# Patient Record
Sex: Female | Born: 1950 | Race: White | Hispanic: No | Marital: Married | Smoking: Never smoker
Health system: Southern US, Community
[De-identification: ages and names within clinical notes are randomized; demographics above are authoritative.]

## PROBLEM LIST (undated history)

## (undated) DIAGNOSIS — N3281 Overactive bladder: Secondary | ICD-10-CM

## (undated) DIAGNOSIS — J189 Pneumonia, unspecified organism: Secondary | ICD-10-CM

## (undated) DIAGNOSIS — E079 Disorder of thyroid, unspecified: Secondary | ICD-10-CM

## (undated) DIAGNOSIS — D649 Anemia, unspecified: Secondary | ICD-10-CM

## (undated) DIAGNOSIS — E039 Hypothyroidism, unspecified: Secondary | ICD-10-CM

## (undated) DIAGNOSIS — T7840XA Allergy, unspecified, initial encounter: Secondary | ICD-10-CM

## (undated) DIAGNOSIS — C801 Malignant (primary) neoplasm, unspecified: Secondary | ICD-10-CM

## (undated) DIAGNOSIS — H8110 Benign paroxysmal vertigo, unspecified ear: Secondary | ICD-10-CM

## (undated) DIAGNOSIS — M79609 Pain in unspecified limb: Secondary | ICD-10-CM

## (undated) DIAGNOSIS — K635 Polyp of colon: Secondary | ICD-10-CM

## (undated) HISTORY — PX: HAND SURGERY: SHX662

## (undated) HISTORY — PX: ABDOMINAL HYSTERECTOMY: SHX81

## (undated) HISTORY — DX: Polyp of colon: K63.5

## (undated) HISTORY — DX: Disorder of thyroid, unspecified: E07.9

## (undated) HISTORY — PX: INGUINAL HERNIA REPAIR: SUR1180

## (undated) HISTORY — DX: Anemia, unspecified: D64.9

## (undated) HISTORY — DX: Pain in unspecified limb: M79.609

## (undated) HISTORY — PX: COLONOSCOPY: SHX174

## (undated) HISTORY — DX: Allergy, unspecified, initial encounter: T78.40XA

## (undated) HISTORY — PX: TONSILLECTOMY: SUR1361

## (undated) HISTORY — PX: TUBAL LIGATION: SHX77

## (undated) HISTORY — PX: POLYPECTOMY: SHX149

## (undated) HISTORY — DX: Benign paroxysmal vertigo, unspecified ear: H81.10

## (undated) HISTORY — PX: UMBILICAL HERNIA REPAIR: SHX196

## (undated) HISTORY — DX: Malignant (primary) neoplasm, unspecified: C80.1

---

## 1993-07-25 HISTORY — PX: BILATERAL OOPHORECTOMY: SHX1221

## 1998-01-05 ENCOUNTER — Ambulatory Visit (HOSPITAL_COMMUNITY): Admission: RE | Admit: 1998-01-05 | Discharge: 1998-01-05 | Payer: Self-pay | Admitting: Urology

## 1998-01-12 ENCOUNTER — Ambulatory Visit (HOSPITAL_COMMUNITY): Admission: RE | Admit: 1998-01-12 | Discharge: 1998-01-12 | Payer: Self-pay | Admitting: Urology

## 1998-05-22 ENCOUNTER — Other Ambulatory Visit: Admission: RE | Admit: 1998-05-22 | Discharge: 1998-05-22 | Payer: Self-pay | Admitting: Obstetrics and Gynecology

## 1999-08-26 ENCOUNTER — Other Ambulatory Visit: Admission: RE | Admit: 1999-08-26 | Discharge: 1999-08-26 | Payer: Self-pay | Admitting: Internal Medicine

## 1999-09-24 ENCOUNTER — Encounter: Admission: RE | Admit: 1999-09-24 | Discharge: 1999-09-24 | Payer: Self-pay | Admitting: Obstetrics and Gynecology

## 1999-09-24 ENCOUNTER — Encounter: Payer: Self-pay | Admitting: Obstetrics and Gynecology

## 1999-10-04 ENCOUNTER — Encounter: Payer: Self-pay | Admitting: Obstetrics and Gynecology

## 1999-10-04 ENCOUNTER — Encounter: Admission: RE | Admit: 1999-10-04 | Discharge: 1999-10-04 | Payer: Self-pay | Admitting: Obstetrics and Gynecology

## 2000-11-02 ENCOUNTER — Encounter: Admission: RE | Admit: 2000-11-02 | Discharge: 2000-11-02 | Payer: Self-pay | Admitting: Obstetrics and Gynecology

## 2000-11-02 ENCOUNTER — Encounter: Payer: Self-pay | Admitting: Obstetrics and Gynecology

## 2001-12-11 ENCOUNTER — Encounter: Payer: Self-pay | Admitting: Family Medicine

## 2001-12-11 ENCOUNTER — Encounter: Admission: RE | Admit: 2001-12-11 | Discharge: 2001-12-11 | Payer: Self-pay | Admitting: Family Medicine

## 2003-04-25 ENCOUNTER — Encounter: Payer: Self-pay | Admitting: Family Medicine

## 2003-04-25 ENCOUNTER — Encounter: Admission: RE | Admit: 2003-04-25 | Discharge: 2003-04-25 | Payer: Self-pay | Admitting: Family Medicine

## 2003-06-27 ENCOUNTER — Encounter: Admission: RE | Admit: 2003-06-27 | Discharge: 2003-06-27 | Payer: Self-pay | Admitting: Family Medicine

## 2004-06-30 ENCOUNTER — Encounter: Admission: RE | Admit: 2004-06-30 | Discharge: 2004-06-30 | Payer: Self-pay | Admitting: Family Medicine

## 2005-08-03 ENCOUNTER — Encounter: Admission: RE | Admit: 2005-08-03 | Discharge: 2005-08-03 | Payer: Self-pay | Admitting: Family Medicine

## 2006-08-08 ENCOUNTER — Encounter: Admission: RE | Admit: 2006-08-08 | Discharge: 2006-08-08 | Payer: Self-pay | Admitting: Family Medicine

## 2007-08-13 ENCOUNTER — Encounter: Admission: RE | Admit: 2007-08-13 | Discharge: 2007-08-13 | Payer: Self-pay | Admitting: Family Medicine

## 2007-08-20 ENCOUNTER — Encounter: Admission: RE | Admit: 2007-08-20 | Discharge: 2007-08-20 | Payer: Self-pay | Admitting: Family Medicine

## 2008-08-13 ENCOUNTER — Encounter: Admission: RE | Admit: 2008-08-13 | Discharge: 2008-08-13 | Payer: Self-pay | Admitting: Family Medicine

## 2009-06-30 ENCOUNTER — Encounter (INDEPENDENT_AMBULATORY_CARE_PROVIDER_SITE_OTHER): Payer: Self-pay | Admitting: *Deleted

## 2009-07-01 ENCOUNTER — Ambulatory Visit: Payer: Self-pay | Admitting: Internal Medicine

## 2009-07-07 ENCOUNTER — Ambulatory Visit: Payer: Self-pay | Admitting: Internal Medicine

## 2009-07-08 ENCOUNTER — Encounter: Payer: Self-pay | Admitting: Internal Medicine

## 2009-08-24 ENCOUNTER — Encounter: Admission: RE | Admit: 2009-08-24 | Discharge: 2009-08-24 | Payer: Self-pay | Admitting: Family Medicine

## 2010-08-14 ENCOUNTER — Other Ambulatory Visit: Payer: Self-pay | Admitting: Family Medicine

## 2010-08-14 DIAGNOSIS — Z0000 Encounter for general adult medical examination without abnormal findings: Secondary | ICD-10-CM

## 2010-08-26 ENCOUNTER — Ambulatory Visit
Admission: RE | Admit: 2010-08-26 | Discharge: 2010-08-26 | Disposition: A | Discharge status: Home / Self Care | Payer: Self-pay | Source: Ambulatory Visit | Attending: Family Medicine | Admitting: Family Medicine

## 2010-08-26 DIAGNOSIS — Z0000 Encounter for general adult medical examination without abnormal findings: Secondary | ICD-10-CM

## 2010-08-31 ENCOUNTER — Other Ambulatory Visit: Payer: Self-pay | Admitting: Family Medicine

## 2010-08-31 DIAGNOSIS — R928 Other abnormal and inconclusive findings on diagnostic imaging of breast: Secondary | ICD-10-CM

## 2010-09-08 ENCOUNTER — Ambulatory Visit
Admission: RE | Admit: 2010-09-08 | Discharge: 2010-09-08 | Disposition: A | Discharge status: Home / Self Care | Payer: Commercial Indemnity | Source: Ambulatory Visit | Attending: Family Medicine | Admitting: Family Medicine

## 2010-09-08 DIAGNOSIS — R928 Other abnormal and inconclusive findings on diagnostic imaging of breast: Secondary | ICD-10-CM

## 2011-01-24 ENCOUNTER — Other Ambulatory Visit: Payer: Self-pay | Admitting: Family Medicine

## 2011-01-24 DIAGNOSIS — M7989 Other specified soft tissue disorders: Secondary | ICD-10-CM

## 2011-01-25 ENCOUNTER — Ambulatory Visit
Admission: RE | Admit: 2011-01-25 | Discharge: 2011-01-25 | Disposition: A | Discharge status: Home / Self Care | Payer: Commercial Indemnity | Source: Ambulatory Visit | Attending: Family Medicine | Admitting: Family Medicine

## 2011-01-25 DIAGNOSIS — M7989 Other specified soft tissue disorders: Secondary | ICD-10-CM

## 2011-03-15 ENCOUNTER — Encounter: Payer: Self-pay | Admitting: Vascular Surgery

## 2011-03-15 DIAGNOSIS — I8390 Asymptomatic varicose veins of unspecified lower extremity: Secondary | ICD-10-CM | POA: Insufficient documentation

## 2011-03-16 ENCOUNTER — Encounter: Payer: Self-pay | Admitting: Vascular Surgery

## 2011-03-21 ENCOUNTER — Encounter: Payer: Self-pay | Admitting: Vascular Surgery

## 2011-03-21 ENCOUNTER — Ambulatory Visit (INDEPENDENT_AMBULATORY_CARE_PROVIDER_SITE_OTHER): Payer: Commercial Indemnity | Admitting: Vascular Surgery

## 2011-03-21 VITALS — BP 134/53 | HR 76 | Resp 20 | Ht 64.0 in | Wt 178.0 lb

## 2011-03-21 DIAGNOSIS — M79609 Pain in unspecified limb: Secondary | ICD-10-CM

## 2011-03-21 DIAGNOSIS — M7989 Other specified soft tissue disorders: Secondary | ICD-10-CM

## 2011-03-21 NOTE — Progress Notes (Signed)
Addended by: Josephina Gip D on: 03/21/2011 02:58 PM   Modules accepted: Orders

## 2011-03-21 NOTE — Progress Notes (Signed)
Subjective:     Patient ID: Karen Jones, female   DOB: 08-23-1950, 60 y.o.   MRN: [id number]  HPI this 60 year old female was referred for evaluation of possible venous insufficiency of both lower extremities. The patient states that for many years now she has had episodes while on her feet and both legs would develop a reddish discoloration in the lower third of the leg. She has no history of deep venous thrombosis or thrombophlebitis or bulging varicose veins. She is intermittently worn elastic compression stockings. She had an episode this summer when both legs became severely swollen on her feet at [employer]. She was given a diuretic subsequently and this resolved her edema. She's had no history of stasis ulcers or bleeding. Currently she states her legs are close to her  baseline for the last several years.  Past Medical History  Diagnosis Date  . Cancer     skin  . Pain in limb     History  Substance Use Topics  . Smoking status: Never Smoker   . Smokeless tobacco: Not on file  . Alcohol Use: Yes    History reviewed. No pertinent family history.  Allergies  Allergen Reactions  . Penicillins     REACTION: rash, difficulty breathing    Current outpatient prescriptions:estradiol (ESTRACE) 0.5 MG tablet, Take 0.5 mg by mouth daily.  , Disp: , Rfl: ;  tolterodine (DETROL LA) 4 MG 24 hr capsule, Take 4 mg by mouth daily.  , Disp: , Rfl:   BP 134/53  Pulse 76  Resp 20  Ht 5\' 4"  (1.626 m)  Wt 178 lb (80.74 kg)  BMI 30.55 kg/m2  Body mass index is 30.55 kg/(m^2).        Review of Systems she denies chest pain dyspnea on exertion PND orthopnea or lower extremity claudication symptoms she is able walk about 1 mile. All systems are negative and a complete review of systems.    Objective:   Physical Exam blood pressure 134/53 heart rate 76 respirations 20 General he is well-developed well-nourished female in no apparent stress he's alert and oriented x3 Chest clear to  auscultation no rhonchi or wheezing Cardiovascular exam reveals a regular rhythm no murmurs carotid pulses are 3+ with no audible bruits Abdomen soft nontender with no masses Neurologic exam is normal Musculoskeletal exams negativefor major deformities Skin exam reveals some mild discoloration in the lower legs symmetrically in the lower third. There are no bulging varicosities noted. She has some spider and reticular veins around the ankle areas bilaterally. There is 1+ edema at most. She is restless femoral popliteal and dorsalis pedis pulses palpable bilaterally.    Assessment:    to rule out valvular incompetence in the superficial venous system were obtained a reflux venous exam bilaterally. There is no evidence of DVT on her previous venous ultrasound study.    Plan:     #1 schedule bilateral venous duplex exam for reflux #2 fit her for some 20-30 mm short-leg elastic compression stockings #3 elevate legs when able to #4 continue intermittent diuretic therapy as prescribed by her medical doctor If her venous duplex exam is abnormal with gross reflux and superficial system-we'll reassess things at that time.

## 2011-04-01 ENCOUNTER — Ambulatory Visit (INDEPENDENT_AMBULATORY_CARE_PROVIDER_SITE_OTHER): Payer: Commercial Indemnity | Admitting: Vascular Surgery

## 2011-04-01 DIAGNOSIS — I83893 Varicose veins of bilateral lower extremities with other complications: Secondary | ICD-10-CM

## 2011-04-01 DIAGNOSIS — R609 Edema, unspecified: Secondary | ICD-10-CM

## 2011-04-01 DIAGNOSIS — I8390 Asymptomatic varicose veins of unspecified lower extremity: Secondary | ICD-10-CM

## 2011-04-05 ENCOUNTER — Telehealth: Payer: Self-pay | Admitting: *Deleted

## 2011-04-05 NOTE — Telephone Encounter (Signed)
Patient came in for a Reflux study on 04/01/11. Reviewed the results with Dr. Hart Rochester. He told me to call the patient, tell her the study was mildly abnormal, and there was nothing for [place] to do right now. Her vessel size is very small and the reflux is minimal. I left a message on the answering machine at her house, and told her to call me back if she had further questions.

## 2011-04-06 NOTE — Procedures (Unsigned)
LOWER EXTREMITY VENOUS REFLUX EXAM  INDICATION:  Bilateral lower extremity pain and edema, varicose veins.  EXAM:  Using color-flow imaging and pulse Doppler spectral analysis, the bilateral common femoral, femoral, popliteal, posterior tibial, great and small saphenous veins were evaluated.  There is evidence suggesting deep venous insufficiency in the bilateral lower extremities.  The bilateral saphenofemoral junctions are competent.  The right GSV is not competent with reflux of >500 milliseconds and the caliber as described below.  The left GSV is competent.  The right proximal small saphenous vein demonstrates incompetency and diameter measurements range from 0.19 cm to 0.57 cm.  The left proximal small saphenous vein demonstrates competency.  GSV Diameter (used if found to be incompetent only)                                           Right    Left Proximal Greater Saphenous Vein           0.62 cm  cm Proximal-to-mid-thigh                     0.34 cm  cm Mid thigh                                 0.21 cm  cm Mid-distal thigh                          cm       cm Distal thigh                              0.29 cm  cm Knee                                      0.28 cm  cm  IMPRESSION: 1. The right great saphenous vein is not competent with reflux of >500     milliseconds. 2. The left great saphenous vein is competent. 3. The bilateral great saphenous veins are not tortuous. 4. The deep venous system bilaterally is not competent with reflux of     >500 milliseconds. 5. The right small saphenous vein is not competent with reflux of >500     milliseconds. 6. The left small saphenous vein is competent.  ___________________________________________ Quita Skye. Hart Rochester, M.D.  SH/MEDQ  D:  04/01/2011  T:  04/01/2011  Job:  [id number]

## 2011-04-26 ENCOUNTER — Telehealth: Payer: Self-pay | Admitting: *Deleted

## 2011-04-26 NOTE — Telephone Encounter (Signed)
Called Karen Jones back and told her the vessel size was too small for [place] to perform a laser ablation. She was fitted with compression stockings and instructed on elevation, etc. She can return to [place] if her symptoms worsen which would indicate that her vessels had increased in size and were good candidates for a laser procedure.

## 2011-04-26 NOTE — Telephone Encounter (Signed)
Crystal from [employer] called for clarification on the results of the Duplex that was performed.

## 2011-05-10 ENCOUNTER — Ambulatory Visit: Payer: Commercial Indemnity | Admitting: Vascular Surgery

## 2011-08-11 ENCOUNTER — Other Ambulatory Visit: Payer: Self-pay | Admitting: Family Medicine

## 2011-08-11 DIAGNOSIS — Z1231 Encounter for screening mammogram for malignant neoplasm of breast: Secondary | ICD-10-CM

## 2011-08-30 ENCOUNTER — Ambulatory Visit
Admission: RE | Admit: 2011-08-30 | Discharge: 2011-08-30 | Disposition: A | Discharge status: Home / Self Care | Payer: Commercial Indemnity | Source: Ambulatory Visit | Attending: Family Medicine | Admitting: Family Medicine

## 2011-08-30 DIAGNOSIS — Z1231 Encounter for screening mammogram for malignant neoplasm of breast: Secondary | ICD-10-CM

## 2011-09-06 ENCOUNTER — Other Ambulatory Visit: Payer: Self-pay | Admitting: Family Medicine

## 2011-09-06 DIAGNOSIS — R928 Other abnormal and inconclusive findings on diagnostic imaging of breast: Secondary | ICD-10-CM

## 2011-09-21 ENCOUNTER — Ambulatory Visit
Admission: RE | Admit: 2011-09-21 | Discharge: 2011-09-21 | Disposition: A | Discharge status: Home / Self Care | Payer: Commercial Indemnity | Source: Ambulatory Visit | Attending: Family Medicine | Admitting: Family Medicine

## 2011-09-21 DIAGNOSIS — R928 Other abnormal and inconclusive findings on diagnostic imaging of breast: Secondary | ICD-10-CM

## 2012-08-13 ENCOUNTER — Other Ambulatory Visit: Payer: Self-pay | Admitting: Family Medicine

## 2012-08-13 DIAGNOSIS — Z1231 Encounter for screening mammogram for malignant neoplasm of breast: Secondary | ICD-10-CM

## 2012-08-30 ENCOUNTER — Ambulatory Visit
Admission: RE | Admit: 2012-08-30 | Discharge: 2012-08-30 | Disposition: A | Discharge status: Home / Self Care | Payer: Commercial Indemnity | Source: Ambulatory Visit | Attending: Family Medicine | Admitting: Family Medicine

## 2012-08-30 DIAGNOSIS — Z1231 Encounter for screening mammogram for malignant neoplasm of breast: Secondary | ICD-10-CM

## 2012-09-03 ENCOUNTER — Other Ambulatory Visit: Payer: Self-pay | Admitting: Family Medicine

## 2012-09-03 DIAGNOSIS — N63 Unspecified lump in unspecified breast: Secondary | ICD-10-CM

## 2012-09-03 DIAGNOSIS — N644 Mastodynia: Secondary | ICD-10-CM

## 2012-09-11 ENCOUNTER — Other Ambulatory Visit: Payer: Self-pay | Admitting: Family Medicine

## 2012-09-11 DIAGNOSIS — N63 Unspecified lump in unspecified breast: Secondary | ICD-10-CM

## 2012-09-11 DIAGNOSIS — N644 Mastodynia: Secondary | ICD-10-CM

## 2012-09-19 ENCOUNTER — Other Ambulatory Visit: Payer: Self-pay | Admitting: Family Medicine

## 2012-09-19 ENCOUNTER — Ambulatory Visit
Admission: RE | Admit: 2012-09-19 | Discharge: 2012-09-19 | Disposition: A | Discharge status: Home / Self Care | Payer: Commercial Indemnity | Source: Ambulatory Visit | Attending: Family Medicine | Admitting: Family Medicine

## 2012-09-19 DIAGNOSIS — N63 Unspecified lump in unspecified breast: Secondary | ICD-10-CM

## 2012-09-20 ENCOUNTER — Other Ambulatory Visit (HOSPITAL_COMMUNITY)
Admission: RE | Admit: 2012-09-20 | Discharge: 2012-09-20 | Disposition: A | Discharge status: Home / Self Care | Payer: Commercial Indemnity | Source: Ambulatory Visit | Attending: Diagnostic Radiology | Admitting: Diagnostic Radiology

## 2012-09-20 DIAGNOSIS — N6009 Solitary cyst of unspecified breast: Secondary | ICD-10-CM | POA: Insufficient documentation

## 2012-11-15 ENCOUNTER — Other Ambulatory Visit: Payer: Self-pay | Admitting: Family Medicine

## 2012-11-15 DIAGNOSIS — N6001 Solitary cyst of right breast: Secondary | ICD-10-CM

## 2012-12-14 ENCOUNTER — Ambulatory Visit
Admission: RE | Admit: 2012-12-14 | Discharge: 2012-12-14 | Disposition: A | Discharge status: Home / Self Care | Payer: Managed Care, Other (non HMO) | Source: Ambulatory Visit | Attending: Family Medicine | Admitting: Family Medicine

## 2012-12-14 DIAGNOSIS — N6001 Solitary cyst of right breast: Secondary | ICD-10-CM

## 2013-08-19 ENCOUNTER — Other Ambulatory Visit: Payer: Self-pay

## 2013-08-19 DIAGNOSIS — Z1231 Encounter for screening mammogram for malignant neoplasm of breast: Secondary | ICD-10-CM

## 2013-09-20 ENCOUNTER — Ambulatory Visit
Admission: RE | Admit: 2013-09-20 | Discharge: 2013-09-20 | Disposition: A | Discharge status: Home / Self Care | Payer: Self-pay | Source: Ambulatory Visit

## 2013-09-20 DIAGNOSIS — Z1231 Encounter for screening mammogram for malignant neoplasm of breast: Secondary | ICD-10-CM

## 2013-09-23 ENCOUNTER — Other Ambulatory Visit: Payer: Self-pay | Admitting: Family Medicine

## 2013-09-23 DIAGNOSIS — R928 Other abnormal and inconclusive findings on diagnostic imaging of breast: Secondary | ICD-10-CM

## 2013-10-03 ENCOUNTER — Ambulatory Visit
Admission: RE | Admit: 2013-10-03 | Discharge: 2013-10-03 | Disposition: A | Discharge status: Home / Self Care | Payer: Self-pay | Source: Ambulatory Visit | Attending: Family Medicine | Admitting: Family Medicine

## 2013-10-03 DIAGNOSIS — R928 Other abnormal and inconclusive findings on diagnostic imaging of breast: Secondary | ICD-10-CM

## 2014-03-05 ENCOUNTER — Other Ambulatory Visit: Payer: Self-pay | Admitting: Family Medicine

## 2014-03-05 DIAGNOSIS — R921 Mammographic calcification found on diagnostic imaging of breast: Secondary | ICD-10-CM

## 2014-04-02 ENCOUNTER — Encounter: Payer: Self-pay | Admitting: Internal Medicine

## 2014-04-08 ENCOUNTER — Other Ambulatory Visit: Payer: Self-pay | Admitting: Family Medicine

## 2014-04-08 ENCOUNTER — Ambulatory Visit
Admission: RE | Admit: 2014-04-08 | Discharge: 2014-04-08 | Disposition: A | Discharge status: Home / Self Care | Payer: Medicare Other | Source: Ambulatory Visit | Attending: Family Medicine | Admitting: Family Medicine

## 2014-04-08 ENCOUNTER — Encounter (INDEPENDENT_AMBULATORY_CARE_PROVIDER_SITE_OTHER): Payer: Self-pay

## 2014-04-08 DIAGNOSIS — R921 Mammographic calcification found on diagnostic imaging of breast: Secondary | ICD-10-CM

## 2014-04-30 ENCOUNTER — Encounter: Payer: Self-pay | Admitting: Internal Medicine

## 2014-06-16 ENCOUNTER — Ambulatory Visit (AMBULATORY_SURGERY_CENTER): Payer: Self-pay | Admitting: *Deleted

## 2014-06-16 VITALS — Ht 64.5 in | Wt 183.0 lb

## 2014-06-16 DIAGNOSIS — Z86010 Personal history of colonic polyps: Secondary | ICD-10-CM

## 2014-06-16 MED ORDER — MOVIPREP 100 G PO SOLR: ORAL | Status: DC

## 2014-06-16 NOTE — Progress Notes (Signed)
Patient denies any allergies to eggs or soy. Patient denies any problems with anesthesia/sedation. Patient denies any oxygen use at home and does not take any diet/weight loss medications. EMMI education assisgned to patient on colonoscopy, this was explained and instructions given to patient. 

## 2014-07-02 ENCOUNTER — Encounter: Payer: Self-pay | Admitting: Internal Medicine

## 2014-07-02 ENCOUNTER — Ambulatory Visit (AMBULATORY_SURGERY_CENTER): Payer: Medicare Other | Admitting: Internal Medicine

## 2014-07-02 VITALS — BP 122/64 | HR 67 | Temp 97.7°F | Resp 15 | Ht 64.5 in | Wt 183.0 lb

## 2014-07-02 DIAGNOSIS — Z800 Family history of malignant neoplasm of digestive organs: Secondary | ICD-10-CM

## 2014-07-02 DIAGNOSIS — D122 Benign neoplasm of ascending colon: Secondary | ICD-10-CM

## 2014-07-02 DIAGNOSIS — D124 Benign neoplasm of descending colon: Secondary | ICD-10-CM

## 2014-07-02 DIAGNOSIS — Z86010 Personal history of colonic polyps: Secondary | ICD-10-CM

## 2014-07-02 MED ORDER — SODIUM CHLORIDE 0.9 % IV SOLN: 500.0000 mL | INTRAVENOUS | Status: DC

## 2014-07-02 NOTE — Progress Notes (Signed)
Called to room to assist during endoscopic procedure.  Patient ID and intended procedure confirmed with present staff. Received instructions for my participation in the procedure from the performing physician.  

## 2014-07-02 NOTE — Patient Instructions (Signed)

## 2014-07-02 NOTE — Op Note (Signed)
[facility]  [address] [place], [address]   COLONOSCOPY PROCEDURE REPORT  PATIENT: Karen, Jones  MR#: [id number] BIRTHDATE: [birth year] , 50  yrs. old GENDER: female ENDOSCOPIST: Lafayette Dragon, MD REFERRED VO:HYWV [place], [place] PROCEDURE DATE:  07/02/2014 PROCEDURE:   Colonoscopy with snare polypectomy First Screening Colonoscopy - Avg.  risk and is 50 yrs.  old or older - No.  Prior Negative Screening - Now for repeat screening. N/A  History of Adenoma - Now for follow-up colonoscopy & has been > or = to 3 yrs.  Yes hx of adenoma.  Has been 3 or more years since last colonoscopy.  History of Adenoma - Now for follow-up colonoscopy & has been > or = to 3 yrs.  Polyps Removed Today? Yes. ASA CLASS:   Class II INDICATIONS:patient's immediate family history of colon cancer and parent with colon cancer.  First colonoscopy in 1995 dysplastic polyp.  In 1997 tubular adenoma, 2005 normal colonoscopy,, December 2010 2 adenomatous and hyperplastic polyps. MEDICATIONS: Monitored anesthesia care and Propofol 400 mg IV  DESCRIPTION OF PROCEDURE:   After the risks benefits and alternatives of the procedure were thoroughly explained, informed consent was obtained.  The digital rectal exam revealed no abnormalities of the rectum.   The LB [id number]  endoscope was introduced through the anus and advanced to the cecum, which was identified by both the appendix and ileocecal valve. No adverse events experienced.   The quality of the prep was good, using MoviPrep  The instrument was then slowly withdrawn as the colon was fully examined.      COLON FINDINGS: Six polypoid shaped and ulcerated semi-pedunculated polyps measuring 23 mm x1 and 4 mm x3 ( ascending colon), in size were found in the descending colon x2 66mm and 3 mm and ascending colon.  A polypectomy was performed with a cold snare.  The resection was complete, the polyp tissue was completely retrieved and  sent to histology.  Retroflexed views revealed no abnormalities. The time to cecum=10 minutes 00 seconds.  Withdrawal time=17 minutes 42 seconds.  The scope was withdrawn and the procedure completed. COMPLICATIONS: There were no immediate complications.  ENDOSCOPIC IMPRESSION: Six semi-pedunculated polyps were found in the descending colon x2 and ascending colon x 4; polypectomy was performed with a cold snare , largest polyp 23 mm, snared piecemeal  RECOMMENDATIONS: 1.  Await pathology results 2.  No aspirin or anti-inflammatory agents for 2 weeks Recall colonoscopy 5 years  eSigned:  Lafayette Dragon, MD 07/02/2014 11:20 AM   cc:   PATIENT NAME:  Karen, Jones MR#: [id number]

## 2014-07-02 NOTE — Progress Notes (Signed)
Patient awakening,vss,report to rn 

## 2014-07-03 ENCOUNTER — Telehealth: Payer: Self-pay | Admitting: *Deleted

## 2014-07-03 NOTE — Telephone Encounter (Signed)
  Follow up Call-  Call back number 07/02/2014  Post procedure Call Back phone  # [phone]  Permission to leave phone message Yes     Patient questions:  Do you have a fever, pain , or abdominal swelling? No. Pain Score  0 *  Have you tolerated food without any problems? Yes.    Have you been able to return to your normal activities? Yes.    Do you have any questions about your discharge instructions: Diet   No. Medications  No. Follow up visit  No.  Do you have questions or concerns about your Care? No.  Actions: * If pain score is 4 or above: No action needed, pain <4.

## 2014-07-07 ENCOUNTER — Encounter: Payer: Self-pay | Admitting: Internal Medicine

## 2014-09-09 ENCOUNTER — Other Ambulatory Visit: Payer: Self-pay | Admitting: Family Medicine

## 2014-09-09 DIAGNOSIS — R921 Mammographic calcification found on diagnostic imaging of breast: Secondary | ICD-10-CM

## 2014-09-09 DIAGNOSIS — N3281 Overactive bladder: Secondary | ICD-10-CM | POA: Diagnosis not present

## 2014-09-09 DIAGNOSIS — J019 Acute sinusitis, unspecified: Secondary | ICD-10-CM | POA: Diagnosis not present

## 2014-09-22 ENCOUNTER — Ambulatory Visit
Admission: RE | Admit: 2014-09-22 | Discharge: 2014-09-22 | Disposition: A | Discharge status: Home / Self Care | Payer: Medicare Other | Source: Ambulatory Visit | Attending: Family Medicine | Admitting: Family Medicine

## 2014-09-22 ENCOUNTER — Encounter (INDEPENDENT_AMBULATORY_CARE_PROVIDER_SITE_OTHER): Payer: Self-pay

## 2014-09-22 DIAGNOSIS — R928 Other abnormal and inconclusive findings on diagnostic imaging of breast: Secondary | ICD-10-CM | POA: Diagnosis not present

## 2014-09-22 DIAGNOSIS — R921 Mammographic calcification found on diagnostic imaging of breast: Secondary | ICD-10-CM

## 2014-11-17 DIAGNOSIS — J019 Acute sinusitis, unspecified: Secondary | ICD-10-CM | POA: Diagnosis not present

## 2014-11-17 DIAGNOSIS — J22 Unspecified acute lower respiratory infection: Secondary | ICD-10-CM | POA: Diagnosis not present

## 2014-12-23 DIAGNOSIS — R2230 Localized swelling, mass and lump, unspecified upper limb: Secondary | ICD-10-CM | POA: Diagnosis not present

## 2014-12-23 DIAGNOSIS — R222 Localized swelling, mass and lump, trunk: Secondary | ICD-10-CM | POA: Diagnosis not present

## 2014-12-23 DIAGNOSIS — M79629 Pain in unspecified upper arm: Secondary | ICD-10-CM | POA: Diagnosis not present

## 2014-12-26 ENCOUNTER — Other Ambulatory Visit: Payer: Self-pay | Admitting: Family Medicine

## 2014-12-26 DIAGNOSIS — R222 Localized swelling, mass and lump, trunk: Secondary | ICD-10-CM

## 2014-12-26 DIAGNOSIS — R2231 Localized swelling, mass and lump, right upper limb: Secondary | ICD-10-CM

## 2015-01-09 ENCOUNTER — Other Ambulatory Visit: Payer: Medicare Other

## 2015-01-09 ENCOUNTER — Ambulatory Visit
Admission: RE | Admit: 2015-01-09 | Discharge: 2015-01-09 | Disposition: A | Discharge status: Home / Self Care | Payer: Medicare Other | Source: Ambulatory Visit | Attending: Family Medicine | Admitting: Family Medicine

## 2015-01-09 DIAGNOSIS — R0789 Other chest pain: Secondary | ICD-10-CM | POA: Diagnosis not present

## 2015-01-09 DIAGNOSIS — R222 Localized swelling, mass and lump, trunk: Secondary | ICD-10-CM

## 2015-01-09 MED ORDER — GADOBENATE DIMEGLUMINE 529 MG/ML IV SOLN
17.0000 mL | Freq: Once | INTRAVENOUS | Status: AC | PRN
  Administered 2015-01-09: 17 mL via INTRAVENOUS

## 2015-01-19 ENCOUNTER — Other Ambulatory Visit: Payer: Self-pay

## 2015-01-21 ENCOUNTER — Encounter: Payer: Self-pay | Admitting: Internal Medicine

## 2015-02-09 DIAGNOSIS — R079 Chest pain, unspecified: Secondary | ICD-10-CM | POA: Diagnosis not present

## 2015-02-09 DIAGNOSIS — R0789 Other chest pain: Secondary | ICD-10-CM | POA: Diagnosis not present

## 2015-02-09 DIAGNOSIS — E039 Hypothyroidism, unspecified: Secondary | ICD-10-CM | POA: Diagnosis not present

## 2015-02-09 DIAGNOSIS — E668 Other obesity: Secondary | ICD-10-CM | POA: Diagnosis not present

## 2015-03-05 DIAGNOSIS — R0789 Other chest pain: Secondary | ICD-10-CM | POA: Diagnosis not present

## 2015-03-05 DIAGNOSIS — E039 Hypothyroidism, unspecified: Secondary | ICD-10-CM | POA: Diagnosis not present

## 2015-03-05 DIAGNOSIS — Z13220 Encounter for screening for lipoid disorders: Secondary | ICD-10-CM | POA: Diagnosis not present

## 2015-03-05 LAB — LIPID PANEL
Cholesterol: 174 mg/dL (ref 0–200)
HDL: 63 mg/dL (ref 35–70)
LDL CALC: 88 mg/dL
Triglycerides: 116 mg/dL (ref 40–160)

## 2015-03-05 LAB — TSH: TSH: 1.7 u[IU]/mL (ref <=5.90)

## 2015-03-05 LAB — BASIC METABOLIC PANEL
BUN: 13 mg/dL (ref 4–21)
Creatinine: 0.7 mg/dL (ref <=1.1)
Glucose: 102 mg/dL
Potassium: 5.1 mmol/L (ref 3.4–5.3)
SODIUM: 144 mmol/L (ref 137–147)

## 2015-03-05 LAB — HEPATIC FUNCTION PANEL
ALK PHOS: 45 U/L (ref 25–125)
ALT: 9 U/L (ref 7–35)
AST: 13 U/L (ref 13–35)
Bilirubin, Total: 0.3 mg/dL

## 2015-03-09 DIAGNOSIS — Z79890 Hormone replacement therapy (postmenopausal): Secondary | ICD-10-CM | POA: Diagnosis not present

## 2015-03-09 DIAGNOSIS — H9193 Unspecified hearing loss, bilateral: Secondary | ICD-10-CM | POA: Diagnosis not present

## 2015-03-09 DIAGNOSIS — N3281 Overactive bladder: Secondary | ICD-10-CM | POA: Diagnosis not present

## 2015-03-09 DIAGNOSIS — Z23 Encounter for immunization: Secondary | ICD-10-CM | POA: Diagnosis not present

## 2015-03-09 DIAGNOSIS — Z0000 Encounter for general adult medical examination without abnormal findings: Secondary | ICD-10-CM | POA: Diagnosis not present

## 2015-03-09 DIAGNOSIS — R0789 Other chest pain: Secondary | ICD-10-CM | POA: Diagnosis not present

## 2015-03-09 DIAGNOSIS — Z01419 Encounter for gynecological examination (general) (routine) without abnormal findings: Secondary | ICD-10-CM | POA: Diagnosis not present

## 2015-03-09 DIAGNOSIS — M6281 Muscle weakness (generalized): Secondary | ICD-10-CM | POA: Diagnosis not present

## 2015-03-09 DIAGNOSIS — E039 Hypothyroidism, unspecified: Secondary | ICD-10-CM | POA: Diagnosis not present

## 2015-03-09 DIAGNOSIS — R202 Paresthesia of skin: Secondary | ICD-10-CM | POA: Diagnosis not present

## 2015-03-09 DIAGNOSIS — Z1211 Encounter for screening for malignant neoplasm of colon: Secondary | ICD-10-CM | POA: Diagnosis not present

## 2015-03-09 LAB — FECAL OCCULT BLOOD, GUAIAC: FECAL OCCULT BLD: NEGATIVE

## 2015-03-10 DIAGNOSIS — Z1272 Encounter for screening for malignant neoplasm of vagina: Secondary | ICD-10-CM | POA: Diagnosis not present

## 2015-03-10 DIAGNOSIS — Z01419 Encounter for gynecological examination (general) (routine) without abnormal findings: Secondary | ICD-10-CM | POA: Diagnosis not present

## 2015-03-11 ENCOUNTER — Other Ambulatory Visit: Payer: Self-pay | Admitting: Family Medicine

## 2015-03-11 DIAGNOSIS — R29898 Other symptoms and signs involving the musculoskeletal system: Secondary | ICD-10-CM

## 2015-03-11 DIAGNOSIS — R0789 Other chest pain: Secondary | ICD-10-CM

## 2015-03-11 DIAGNOSIS — R200 Anesthesia of skin: Secondary | ICD-10-CM

## 2015-03-19 ENCOUNTER — Ambulatory Visit
Admission: RE | Admit: 2015-03-19 | Discharge: 2015-03-19 | Disposition: A | Discharge status: Home / Self Care | Payer: Medicare Other | Source: Ambulatory Visit | Attending: Family Medicine | Admitting: Family Medicine

## 2015-03-19 DIAGNOSIS — H2513 Age-related nuclear cataract, bilateral: Secondary | ICD-10-CM | POA: Diagnosis not present

## 2015-03-19 DIAGNOSIS — R29898 Other symptoms and signs involving the musculoskeletal system: Secondary | ICD-10-CM

## 2015-03-19 DIAGNOSIS — M542 Cervicalgia: Secondary | ICD-10-CM | POA: Diagnosis not present

## 2015-03-19 DIAGNOSIS — R0789 Other chest pain: Secondary | ICD-10-CM

## 2015-03-19 DIAGNOSIS — R200 Anesthesia of skin: Secondary | ICD-10-CM

## 2015-03-26 DIAGNOSIS — H903 Sensorineural hearing loss, bilateral: Secondary | ICD-10-CM | POA: Diagnosis not present

## 2015-03-26 DIAGNOSIS — H6981 Other specified disorders of Eustachian tube, right ear: Secondary | ICD-10-CM | POA: Diagnosis not present

## 2015-04-06 DIAGNOSIS — Z6831 Body mass index (BMI) 31.0-31.9, adult: Secondary | ICD-10-CM | POA: Diagnosis not present

## 2015-04-06 DIAGNOSIS — M5412 Radiculopathy, cervical region: Secondary | ICD-10-CM | POA: Diagnosis not present

## 2015-04-13 DIAGNOSIS — M5412 Radiculopathy, cervical region: Secondary | ICD-10-CM | POA: Diagnosis not present

## 2015-04-13 DIAGNOSIS — M9981 Other biomechanical lesions of cervical region: Secondary | ICD-10-CM | POA: Diagnosis not present

## 2015-04-13 DIAGNOSIS — R202 Paresthesia of skin: Secondary | ICD-10-CM | POA: Diagnosis not present

## 2015-04-14 DIAGNOSIS — L570 Actinic keratosis: Secondary | ICD-10-CM | POA: Diagnosis not present

## 2015-04-14 DIAGNOSIS — Z23 Encounter for immunization: Secondary | ICD-10-CM | POA: Diagnosis not present

## 2015-04-14 DIAGNOSIS — X32XXXA Exposure to sunlight, initial encounter: Secondary | ICD-10-CM | POA: Diagnosis not present

## 2015-04-14 DIAGNOSIS — Z08 Encounter for follow-up examination after completed treatment for malignant neoplasm: Secondary | ICD-10-CM | POA: Diagnosis not present

## 2015-04-14 DIAGNOSIS — L820 Inflamed seborrheic keratosis: Secondary | ICD-10-CM | POA: Diagnosis not present

## 2015-04-14 DIAGNOSIS — L718 Other rosacea: Secondary | ICD-10-CM | POA: Diagnosis not present

## 2015-04-14 DIAGNOSIS — Z85828 Personal history of other malignant neoplasm of skin: Secondary | ICD-10-CM | POA: Diagnosis not present

## 2015-04-14 DIAGNOSIS — D225 Melanocytic nevi of trunk: Secondary | ICD-10-CM | POA: Diagnosis not present

## 2015-04-14 DIAGNOSIS — D485 Neoplasm of uncertain behavior of skin: Secondary | ICD-10-CM | POA: Diagnosis not present

## 2015-04-14 DIAGNOSIS — D2371 Other benign neoplasm of skin of right lower limb, including hip: Secondary | ICD-10-CM | POA: Diagnosis not present

## 2015-04-23 DIAGNOSIS — M5412 Radiculopathy, cervical region: Secondary | ICD-10-CM | POA: Diagnosis not present

## 2015-04-23 DIAGNOSIS — Z6831 Body mass index (BMI) 31.0-31.9, adult: Secondary | ICD-10-CM | POA: Diagnosis not present

## 2015-04-29 ENCOUNTER — Other Ambulatory Visit: Payer: Self-pay | Admitting: Family Medicine

## 2015-04-29 DIAGNOSIS — N951 Menopausal and female climacteric states: Secondary | ICD-10-CM | POA: Insufficient documentation

## 2015-04-29 DIAGNOSIS — R2231 Localized swelling, mass and lump, right upper limb: Secondary | ICD-10-CM | POA: Diagnosis not present

## 2015-04-29 DIAGNOSIS — R32 Unspecified urinary incontinence: Secondary | ICD-10-CM | POA: Insufficient documentation

## 2015-04-29 DIAGNOSIS — M722 Plantar fascial fibromatosis: Secondary | ICD-10-CM | POA: Insufficient documentation

## 2015-04-29 DIAGNOSIS — M549 Dorsalgia, unspecified: Secondary | ICD-10-CM | POA: Insufficient documentation

## 2015-04-29 DIAGNOSIS — H8110 Benign paroxysmal vertigo, unspecified ear: Secondary | ICD-10-CM | POA: Insufficient documentation

## 2015-04-29 DIAGNOSIS — R29898 Other symptoms and signs involving the musculoskeletal system: Secondary | ICD-10-CM | POA: Insufficient documentation

## 2015-04-29 DIAGNOSIS — N6310 Unspecified lump in the right breast, unspecified quadrant: Secondary | ICD-10-CM

## 2015-04-29 DIAGNOSIS — R937 Abnormal findings on diagnostic imaging of other parts of musculoskeletal system: Secondary | ICD-10-CM | POA: Insufficient documentation

## 2015-04-29 DIAGNOSIS — M7989 Other specified soft tissue disorders: Secondary | ICD-10-CM | POA: Diagnosis not present

## 2015-04-29 DIAGNOSIS — Z79890 Hormone replacement therapy (postmenopausal): Secondary | ICD-10-CM | POA: Insufficient documentation

## 2015-04-29 DIAGNOSIS — R2230 Localized swelling, mass and lump, unspecified upper limb: Secondary | ICD-10-CM | POA: Insufficient documentation

## 2015-04-29 DIAGNOSIS — W57XXXA Bitten or stung by nonvenomous insect and other nonvenomous arthropods, initial encounter: Secondary | ICD-10-CM | POA: Insufficient documentation

## 2015-04-29 DIAGNOSIS — Z23 Encounter for immunization: Secondary | ICD-10-CM | POA: Insufficient documentation

## 2015-04-29 DIAGNOSIS — H6991 Unspecified Eustachian tube disorder, right ear: Secondary | ICD-10-CM | POA: Insufficient documentation

## 2015-04-29 DIAGNOSIS — J019 Acute sinusitis, unspecified: Secondary | ICD-10-CM | POA: Insufficient documentation

## 2015-04-29 DIAGNOSIS — Z01419 Encounter for gynecological examination (general) (routine) without abnormal findings: Secondary | ICD-10-CM | POA: Insufficient documentation

## 2015-04-29 DIAGNOSIS — J22 Unspecified acute lower respiratory infection: Secondary | ICD-10-CM | POA: Insufficient documentation

## 2015-04-29 DIAGNOSIS — H903 Sensorineural hearing loss, bilateral: Secondary | ICD-10-CM | POA: Insufficient documentation

## 2015-04-29 DIAGNOSIS — H9193 Unspecified hearing loss, bilateral: Secondary | ICD-10-CM | POA: Insufficient documentation

## 2015-04-29 DIAGNOSIS — N6325 Unspecified lump in the left breast, overlapping quadrants: Secondary | ICD-10-CM | POA: Insufficient documentation

## 2015-04-29 DIAGNOSIS — E559 Vitamin D deficiency, unspecified: Secondary | ICD-10-CM | POA: Insufficient documentation

## 2015-04-29 DIAGNOSIS — R200 Anesthesia of skin: Secondary | ICD-10-CM | POA: Insufficient documentation

## 2015-04-29 DIAGNOSIS — R0789 Other chest pain: Secondary | ICD-10-CM | POA: Insufficient documentation

## 2015-05-01 ENCOUNTER — Other Ambulatory Visit: Payer: Self-pay | Admitting: *Deleted

## 2015-05-06 ENCOUNTER — Ambulatory Visit
Admission: RE | Admit: 2015-05-06 | Discharge: 2015-05-06 | Disposition: A | Discharge status: Home / Self Care | Payer: Medicare Other | Source: Ambulatory Visit | Attending: Family Medicine | Admitting: Family Medicine

## 2015-05-06 DIAGNOSIS — R928 Other abnormal and inconclusive findings on diagnostic imaging of breast: Secondary | ICD-10-CM | POA: Diagnosis not present

## 2015-05-06 DIAGNOSIS — R2231 Localized swelling, mass and lump, right upper limb: Secondary | ICD-10-CM

## 2015-05-06 DIAGNOSIS — N6489 Other specified disorders of breast: Secondary | ICD-10-CM | POA: Diagnosis not present

## 2015-05-12 ENCOUNTER — Encounter: Payer: Self-pay | Admitting: Vascular Surgery

## 2015-05-13 ENCOUNTER — Ambulatory Visit (INDEPENDENT_AMBULATORY_CARE_PROVIDER_SITE_OTHER): Payer: Medicare Other | Admitting: Vascular Surgery

## 2015-05-13 ENCOUNTER — Encounter: Payer: Self-pay | Admitting: Vascular Surgery

## 2015-05-13 ENCOUNTER — Ambulatory Visit (HOSPITAL_COMMUNITY)
Admission: RE | Admit: 2015-05-13 | Discharge: 2015-05-13 | Disposition: A | Discharge status: Home / Self Care | Payer: Medicare Other | Source: Ambulatory Visit | Attending: Vascular Surgery | Admitting: Vascular Surgery

## 2015-05-13 VITALS — BP 120/60 | HR 77 | Ht 64.5 in | Wt 187.7 lb

## 2015-05-13 DIAGNOSIS — I6523 Occlusion and stenosis of bilateral carotid arteries: Secondary | ICD-10-CM | POA: Diagnosis not present

## 2015-05-13 DIAGNOSIS — R208 Other disturbances of skin sensation: Secondary | ICD-10-CM | POA: Diagnosis not present

## 2015-05-13 DIAGNOSIS — R2231 Localized swelling, mass and lump, right upper limb: Secondary | ICD-10-CM

## 2015-05-13 DIAGNOSIS — R200 Anesthesia of skin: Secondary | ICD-10-CM

## 2015-05-13 NOTE — Progress Notes (Signed)
[facility] HISTORY AND PHYSICAL   History of Present Illness:  Patient is a 64 y.o. year old female who presents for evaluation of right arm numbness and tingling with activity.  The patient has a several month history of occasional right axillary pain with right upper extremity numbness and tingling. This pain consistently occurs with exertion of brisk walking. The patient states she does not experience any pain unless she pushes herself. If she walks at a slow leisurely place she does not get any of this pain. She denies any weakness of the arm. She states she does occasionally get some dizziness if she extends her neck. The pain occurs consistently when walking a quarter mile uphill on an incline. She states she also occasionally will have the symptoms when cleaning the floor. She states she had some type of cardiac testing performed by Dr. Wynonia Lawman in July. These results are not available for review today.  She has also had an extensive breast and axillary workup to rule out any sort of breast pathology. She has also recently seen Dr. Hal Neer in her workup consisted of a cervical spine MRI which showed some degenerative cervical spine disease but no obvious severe compression. She was referred to me by Dr. Hal Neer to rule out a vascular etiology for the numbness and tingling in her arm. He denies any prior neck trauma. Other medical problems include prior skin cancers, anemia, thyroid disease all of which are currently controlled. She denies history of smoking. She denies history of coronary artery disease elevated cholesterol family history of early vascular disease or hypertension.  Past Medical History  Diagnosis Date  . Cancer ([place])     skin  . Pain in limb   . Thyroid disease   . Colon polyps   . Anemia     Past Surgical History  Procedure Laterality Date  . Abdominal hysterectomy    . Tonsillectomy    . Tubal ligation    . Inguinal hernia repair    .  Polypectomy    . Colonoscopy  1995,1997,2001,2005,2010    polyps removed every colonoscopy except 1 time,per pt  . Bilateral oophorectomy  1995    Social History Social History  Substance Use Topics  . Smoking status: Never Smoker   . Smokeless tobacco: Never Used  . Alcohol Use: 3.0 oz/week    3 Glasses of wine, 2 Shots of liquor per week    Family History Family History  Problem Relation Age of Onset  . Emphysema Mother   . Colon cancer Father 26  . Cancer Father   . Heart disease Father   . Heart attack Father     Allergies  Allergies  Allergen Reactions  . Penicillins Shortness Of Breath and Rash    REACTION: rash, difficulty breathing     Current Outpatient Prescriptions  Medication Sig Dispense Refill  . doxycycline (VIBRAMYCIN) 50 MG capsule     . estradiol (ESTRACE) 0.5 MG tablet Take 0.5 mg by mouth daily.      Marland Kitchen levothyroxine (SYNTHROID, LEVOTHROID) 75 MCG tablet Take 75 mcg by mouth daily before breakfast.    . mirabegron ER (MYRBETRIQ) 50 MG TB24 tablet Take by mouth.    . Probiotic Product (PROBIOTIC DAILY PO) Take by mouth.    . Pseudoephedrine HCl (SUDAFED 24 HOUR PO) Take 1 tablet by mouth daily as needed.    . tolterodine (DETROL LA) 4 MG 24 hr capsule Take 4 mg by mouth daily.  No current facility-administered medications for this visit.    ROS:   General:  No weight loss, Fever, chills  HEENT: No recent headaches, no nasal bleeding, no visual changes, no sore throat  Neurologic: No dizziness, blackouts, seizures. No recent symptoms of stroke or mini- stroke. No recent episodes of slurred speech, or temporary blindness.  Cardiac: No recent episodes of chest pain/pressure, no shortness of breath at rest.  No shortness of breath with exertion.  Denies history of atrial fibrillation or irregular heartbeat  Vascular: No history of rest pain in feet.  No history of claudication.  No history of non-healing ulcer, No history of DVT    Pulmonary: No home oxygen, no productive cough, no hemoptysis,  No asthma or wheezing  Musculoskeletal:  [ ]  Arthritis, [ ]  Low back pain,  [ ]  Joint pain  Hematologic:No history of hypercoagulable state.  No history of easy bleeding.  No history of anemia  Gastrointestinal: No hematochezia or melena,  No gastroesophageal reflux, no trouble swallowing  Urinary: [ ]  chronic Kidney disease, [ ]  on HD - [ ]  MWF or [ ]  TTHS, [ ]  Burning with urination, [ ]  Frequent urination, [ ]  Difficulty urinating;   Skin: No rashes  Psychological: No history of anxiety,  No history of depression   Physical Examination  Filed Vitals:   05/13/15 0920 05/13/15 0934  BP: 130/59 120/60  Pulse: 77   Height: 5' 4.5" (1.638 m)   Weight: 187 lb 11.2 oz (85.14 kg)   SpO2: 100%     Body mass index is 31.73 kg/(m^2).  General:  Alert and oriented, no acute distress HEENT: Normal Neck: No bruit or JVD Pulmonary: Clear to auscultation bilaterally Cardiac: Regular Rate and Rhythm without murmur Abdomen: Soft, non-tender, non-distended, no mass Skin: No rash Extremity Pulses:  2+ radial, brachial, femoral, dorsalis pedis, posterior tibial pulses bilaterally Musculoskeletal: No deformity or edema  Neurologic: Upper and lower extremity motor 5/5 and symmetric  DATA:  Patient had bilateral carotid duplex exam today which showed no significant carotid stenosis. She had antegrade vertebral flow bilaterally. She had triphasic brachial artery waves bilaterally   ASSESSMENT:  No evidence of arterial or venous pathology or evidence of thoracic outlet syndrome to suggest etiology for her current symptoms. Since she had previously seen Dr. Wynonia Lawman and he suggested she return to her if she had a negative workup for her compression she return to see him. I advised her to return for further follow-up with Dr. Wynonia Lawman since most of her symptoms seem to be elicited by exertion.   PLAN:  She will follow-up with me on  an as-needed basis.  Ruta Hinds, MD [facility] Office: [phone] Pager: [phone]

## 2015-05-15 ENCOUNTER — Other Ambulatory Visit: Payer: Self-pay | Admitting: Cardiology

## 2015-05-15 ENCOUNTER — Ambulatory Visit
Admission: RE | Admit: 2015-05-15 | Discharge: 2015-05-15 | Disposition: A | Discharge status: Home / Self Care | Payer: Medicare Other | Source: Ambulatory Visit | Attending: Cardiology | Admitting: Cardiology

## 2015-05-15 DIAGNOSIS — R0789 Other chest pain: Secondary | ICD-10-CM

## 2015-05-15 DIAGNOSIS — E668 Other obesity: Secondary | ICD-10-CM | POA: Diagnosis not present

## 2015-05-15 DIAGNOSIS — E039 Hypothyroidism, unspecified: Secondary | ICD-10-CM | POA: Diagnosis not present

## 2015-05-15 DIAGNOSIS — R079 Chest pain, unspecified: Secondary | ICD-10-CM | POA: Diagnosis not present

## 2015-05-18 ENCOUNTER — Encounter: Payer: Self-pay | Admitting: Physical Medicine and Rehabilitation

## 2015-05-19 ENCOUNTER — Other Ambulatory Visit: Payer: Self-pay | Admitting: Cardiology

## 2015-05-19 DIAGNOSIS — E669 Obesity, unspecified: Secondary | ICD-10-CM

## 2015-05-19 DIAGNOSIS — R079 Chest pain, unspecified: Secondary | ICD-10-CM

## 2015-05-19 DIAGNOSIS — R0789 Other chest pain: Secondary | ICD-10-CM

## 2015-05-26 ENCOUNTER — Other Ambulatory Visit: Payer: Self-pay | Admitting: Cardiology

## 2015-05-26 DIAGNOSIS — R072 Precordial pain: Secondary | ICD-10-CM

## 2015-05-26 DIAGNOSIS — R0789 Other chest pain: Secondary | ICD-10-CM

## 2015-05-29 ENCOUNTER — Other Ambulatory Visit: Payer: Self-pay | Admitting: Cardiology

## 2015-05-29 DIAGNOSIS — R079 Chest pain, unspecified: Secondary | ICD-10-CM

## 2015-06-03 DIAGNOSIS — M79605 Pain in left leg: Secondary | ICD-10-CM | POA: Diagnosis not present

## 2015-06-03 DIAGNOSIS — M79604 Pain in right leg: Secondary | ICD-10-CM | POA: Diagnosis not present

## 2015-06-03 DIAGNOSIS — R600 Localized edema: Secondary | ICD-10-CM | POA: Diagnosis not present

## 2015-06-15 ENCOUNTER — Ambulatory Visit (HOSPITAL_COMMUNITY)
Admission: RE | Admit: 2015-06-15 | Discharge: 2015-06-15 | Disposition: A | Discharge status: Home / Self Care | Payer: Medicare Other | Source: Ambulatory Visit | Attending: Cardiology | Admitting: Cardiology

## 2015-06-15 DIAGNOSIS — R072 Precordial pain: Secondary | ICD-10-CM | POA: Diagnosis not present

## 2015-06-15 DIAGNOSIS — R079 Chest pain, unspecified: Secondary | ICD-10-CM | POA: Diagnosis not present

## 2015-06-15 LAB — POCT I-STAT CREATININE: Creatinine, Ser: 0.8 mg/dL (ref 0.44–1.00)

## 2015-06-15 MED ORDER — METOPROLOL TARTRATE 100 MG PO TABS
100.0000 mg | ORAL_TABLET | Freq: Once | ORAL | Status: AC
  Administered 2015-06-15: 100 mg via ORAL

## 2015-06-15 MED ORDER — NITROGLYCERIN 0.4 MG SL SUBL
SUBLINGUAL_TABLET | SUBLINGUAL | Status: AC
  Administered 2015-06-15: 11:00:00
  Filled 2015-06-15: qty 1

## 2015-06-15 MED ORDER — NITROGLYCERIN 0.4 MG SL SUBL
0.4000 mg | SUBLINGUAL_TABLET | SUBLINGUAL | Status: DC | PRN
  Filled 2015-06-15: qty 25

## 2015-06-15 MED ORDER — METOPROLOL TARTRATE 50 MG PO TABS
ORAL_TABLET | ORAL | Status: AC
  Filled 2015-06-15: qty 2

## 2015-06-15 MED ORDER — IOHEXOL 350 MG/ML SOLN
80.0000 mL | Freq: Once | INTRAVENOUS | Status: AC | PRN
  Administered 2015-06-15: 80 mL via INTRAVENOUS

## 2015-06-15 NOTE — Progress Notes (Signed)
CT completed. Pt awake and alert. In no distress. Tolerated sprite and crackers well. Discharged home walking with husband.

## 2015-06-16 DIAGNOSIS — L821 Other seborrheic keratosis: Secondary | ICD-10-CM | POA: Diagnosis not present

## 2015-06-16 DIAGNOSIS — L570 Actinic keratosis: Secondary | ICD-10-CM | POA: Diagnosis not present

## 2015-06-16 DIAGNOSIS — D225 Melanocytic nevi of trunk: Secondary | ICD-10-CM | POA: Diagnosis not present

## 2015-06-16 DIAGNOSIS — D045 Carcinoma in situ of skin of trunk: Secondary | ICD-10-CM | POA: Diagnosis not present

## 2015-06-16 DIAGNOSIS — Z1283 Encounter for screening for malignant neoplasm of skin: Secondary | ICD-10-CM | POA: Diagnosis not present

## 2015-06-16 DIAGNOSIS — X32XXXD Exposure to sunlight, subsequent encounter: Secondary | ICD-10-CM | POA: Diagnosis not present

## 2015-06-25 DIAGNOSIS — I8311 Varicose veins of right lower extremity with inflammation: Secondary | ICD-10-CM | POA: Diagnosis not present

## 2015-06-25 DIAGNOSIS — I8312 Varicose veins of left lower extremity with inflammation: Secondary | ICD-10-CM | POA: Diagnosis not present

## 2015-07-14 DIAGNOSIS — Z85828 Personal history of other malignant neoplasm of skin: Secondary | ICD-10-CM | POA: Diagnosis not present

## 2015-07-14 DIAGNOSIS — L718 Other rosacea: Secondary | ICD-10-CM | POA: Diagnosis not present

## 2015-07-14 DIAGNOSIS — Z08 Encounter for follow-up examination after completed treatment for malignant neoplasm: Secondary | ICD-10-CM | POA: Diagnosis not present

## 2015-07-29 DIAGNOSIS — I8312 Varicose veins of left lower extremity with inflammation: Secondary | ICD-10-CM | POA: Diagnosis not present

## 2015-07-29 DIAGNOSIS — I83812 Varicose veins of left lower extremities with pain: Secondary | ICD-10-CM | POA: Diagnosis not present

## 2015-08-07 ENCOUNTER — Other Ambulatory Visit: Payer: Self-pay | Admitting: Family Medicine

## 2015-08-07 ENCOUNTER — Other Ambulatory Visit: Payer: Self-pay

## 2015-08-07 DIAGNOSIS — N644 Mastodynia: Secondary | ICD-10-CM

## 2015-08-13 ENCOUNTER — Ambulatory Visit
Admission: RE | Admit: 2015-08-13 | Discharge: 2015-08-13 | Disposition: A | Discharge status: Home / Self Care | Payer: Medicare Other | Source: Ambulatory Visit | Attending: Family Medicine | Admitting: Family Medicine

## 2015-08-13 ENCOUNTER — Other Ambulatory Visit: Payer: Self-pay | Admitting: Family Medicine

## 2015-08-13 ENCOUNTER — Other Ambulatory Visit: Payer: Self-pay

## 2015-08-13 DIAGNOSIS — I83812 Varicose veins of left lower extremities with pain: Secondary | ICD-10-CM | POA: Diagnosis not present

## 2015-08-13 DIAGNOSIS — Z1231 Encounter for screening mammogram for malignant neoplasm of breast: Secondary | ICD-10-CM

## 2015-08-13 DIAGNOSIS — N644 Mastodynia: Secondary | ICD-10-CM

## 2015-08-13 DIAGNOSIS — I83892 Varicose veins of left lower extremities with other complications: Secondary | ICD-10-CM | POA: Diagnosis not present

## 2015-08-28 DIAGNOSIS — E039 Hypothyroidism, unspecified: Secondary | ICD-10-CM | POA: Diagnosis not present

## 2015-09-01 DIAGNOSIS — Z79890 Hormone replacement therapy (postmenopausal): Secondary | ICD-10-CM | POA: Diagnosis not present

## 2015-09-01 DIAGNOSIS — M79621 Pain in right upper arm: Secondary | ICD-10-CM | POA: Diagnosis not present

## 2015-09-01 DIAGNOSIS — E039 Hypothyroidism, unspecified: Secondary | ICD-10-CM | POA: Diagnosis not present

## 2015-09-01 DIAGNOSIS — N3281 Overactive bladder: Secondary | ICD-10-CM | POA: Diagnosis not present

## 2015-09-03 DIAGNOSIS — M79605 Pain in left leg: Secondary | ICD-10-CM | POA: Diagnosis not present

## 2015-09-03 DIAGNOSIS — I83812 Varicose veins of left lower extremities with pain: Secondary | ICD-10-CM | POA: Diagnosis not present

## 2015-09-04 DIAGNOSIS — M79605 Pain in left leg: Secondary | ICD-10-CM | POA: Diagnosis not present

## 2015-09-04 DIAGNOSIS — I83812 Varicose veins of left lower extremities with pain: Secondary | ICD-10-CM | POA: Diagnosis not present

## 2015-10-06 ENCOUNTER — Ambulatory Visit: Payer: Medicare Other

## 2015-10-06 ENCOUNTER — Ambulatory Visit
Admission: RE | Admit: 2015-10-06 | Discharge: 2015-10-06 | Disposition: A | Discharge status: Home / Self Care | Payer: Medicare Other | Source: Ambulatory Visit

## 2015-10-06 DIAGNOSIS — Z1231 Encounter for screening mammogram for malignant neoplasm of breast: Secondary | ICD-10-CM

## 2015-11-18 DIAGNOSIS — H8112 Benign paroxysmal vertigo, left ear: Secondary | ICD-10-CM | POA: Diagnosis not present

## 2015-11-18 DIAGNOSIS — Z08 Encounter for follow-up examination after completed treatment for malignant neoplasm: Secondary | ICD-10-CM | POA: Diagnosis not present

## 2015-11-18 DIAGNOSIS — L820 Inflamed seborrheic keratosis: Secondary | ICD-10-CM | POA: Diagnosis not present

## 2015-11-18 DIAGNOSIS — D2371 Other benign neoplasm of skin of right lower limb, including hip: Secondary | ICD-10-CM | POA: Diagnosis not present

## 2015-11-18 DIAGNOSIS — Z85828 Personal history of other malignant neoplasm of skin: Secondary | ICD-10-CM | POA: Diagnosis not present

## 2015-11-18 DIAGNOSIS — J0101 Acute recurrent maxillary sinusitis: Secondary | ICD-10-CM | POA: Diagnosis not present

## 2015-11-20 ENCOUNTER — Encounter: Payer: Self-pay | Admitting: Family Medicine

## 2015-11-20 ENCOUNTER — Ambulatory Visit (INDEPENDENT_AMBULATORY_CARE_PROVIDER_SITE_OTHER): Payer: Medicare Other | Admitting: Family Medicine

## 2015-11-20 VITALS — BP 118/76 | HR 83 | Temp 98.6°F | Resp 17 | Ht 65.0 in | Wt 186.0 lb

## 2015-11-20 DIAGNOSIS — M25511 Pain in right shoulder: Secondary | ICD-10-CM | POA: Diagnosis not present

## 2015-11-20 DIAGNOSIS — M79621 Pain in right upper arm: Secondary | ICD-10-CM

## 2015-11-20 DIAGNOSIS — M79629 Pain in unspecified upper arm: Secondary | ICD-10-CM | POA: Insufficient documentation

## 2015-11-20 LAB — CBC WITH DIFFERENTIAL/PLATELET
BASOS PCT: 0.4 % (ref 0.0–3.0)
Basophils Absolute: 0 10*3/uL (ref 0.0–0.1)
EOS PCT: 0.7 % (ref 0.0–5.0)
Eosinophils Absolute: 0.1 10*3/uL (ref 0.0–0.7)
HEMATOCRIT: 40.3 % (ref 36.0–46.0)
HEMOGLOBIN: 13.7 g/dL (ref 12.0–15.0)
LYMPHS PCT: 19.2 % (ref 12.0–46.0)
Lymphs Abs: 1.7 10*3/uL (ref 0.7–4.0)
MCHC: 33.9 g/dL (ref 30.0–36.0)
MCV: 90.3 fl (ref 78.0–100.0)
MONO ABS: 0.5 10*3/uL (ref 0.1–1.0)
Monocytes Relative: 5.1 % (ref 3.0–12.0)
NEUTROS ABS: 6.6 10*3/uL (ref 1.4–7.7)
Neutrophils Relative %: 74.6 % (ref 43.0–77.0)
PLATELETS: 309 10*3/uL (ref 150.0–400.0)
RBC: 4.46 Mil/uL (ref 3.87–5.11)
RDW: 13.7 % (ref 11.5–15.5)
WBC: 8.8 10*3/uL (ref 4.0–10.5)

## 2015-11-20 LAB — BASIC METABOLIC PANEL
BUN: 21 mg/dL (ref 6–23)
CALCIUM: 9.4 mg/dL (ref 8.4–10.5)
CO2: 30 mEq/L (ref 19–32)
CREATININE: 1.01 mg/dL (ref 0.40–1.20)
Chloride: 104 mEq/L (ref 96–112)
GFR: 58.14 mL/min — AB (ref >60.00)
GLUCOSE: 137 mg/dL — AB (ref 70–99)
Potassium: 4.1 mEq/L (ref 3.5–5.1)
Sodium: 140 mEq/L (ref 135–145)

## 2015-11-20 NOTE — Progress Notes (Signed)
Pre visit review using our clinic review tool, if applicable. No additional management support is needed unless otherwise documented below in the visit note. 

## 2015-11-20 NOTE — Progress Notes (Signed)
   Subjective:    Patient ID: Karen Jones, female    DOB: [birth year], 65 y.o.   MRN: [id number]  HPI New to establish.  Previous MD- Bing Matter  R axillary pain- sxs started 4 yrs ago when she started walking.  Pain was localized to R axilla.  Was told to buy a better bra by her physician at the time.  After 1 yr, pain was radiating into her arm and her arm would go numb.  Last year, pain and numbness radiated up into R ear.  Pt reports R side 'feels heavy and it hurts'.  Was referred to cardiology- had normal stress test.  Was told it was nerve related and was sent to Neuro for nerve conduction.  Had MRI done of neck- was told it was normal.  Went back to cards who referred her to vascular surgeon.  Had dopplers done that were normal.  Had CXR done- WNL.  Had cardiac CTA WNL.  Is also UTD on mammogram and [place].   Review of Systems For ROS see HPI     Objective:   Physical Exam  Constitutional: She is oriented to person, place, and time. She appears well-developed and well-nourished. No distress.  HENT:  Head: Normocephalic and atraumatic.  Eyes: Conjunctivae and EOM are normal. Pupils are equal, round, and reactive to light.  Neck: Normal range of motion. Neck supple. No thyromegaly present.  Cardiovascular: Normal rate, regular rhythm, normal heart sounds and intact distal pulses.   Pulmonary/Chest: Effort normal and breath sounds normal. No respiratory distress. She has no wheezes. She has no rales.  Musculoskeletal: She exhibits tenderness (TTP over R axilla w/ soft tissue firmness in center of axilla w/o fluctuance).  Good ROM of R shoulder but some radiating pain down R arm  Lymphadenopathy:    She has no cervical adenopathy.  Neurological: She is alert and oriented to person, place, and time.  Skin: Skin is warm and dry. No erythema.  Psychiatric: She has a normal mood and affect. Her behavior is normal. Thought content normal.  Vitals reviewed.         Assessment &  Plan:

## 2015-11-20 NOTE — Patient Instructions (Addendum)
Follow up as needed We'll notify you of your lab results and make any changes if needed I ordered the MRI of the R shoulder- we'll see if insurance approves this If they don't approve it- we can get an ultrasound or refer to ortho Call with any questions or concerns Welcome!  We're glad to have you! Hang in there!!!

## 2015-11-23 ENCOUNTER — Encounter: Payer: Self-pay | Admitting: General Practice

## 2015-11-23 ENCOUNTER — Other Ambulatory Visit (INDEPENDENT_AMBULATORY_CARE_PROVIDER_SITE_OTHER): Payer: Medicare Other

## 2015-11-23 DIAGNOSIS — R7309 Other abnormal glucose: Secondary | ICD-10-CM

## 2015-11-23 LAB — HEMOGLOBIN A1C: HEMOGLOBIN A1C: 6 % (ref 4.6–6.5)

## 2015-11-23 NOTE — Assessment & Plan Note (Signed)
New to provider, ongoing for pt.  She has had cardiology, neuro, and vascular work ups.  Pain is now constant and worsening.  She is having pain that radiates down her R arm.  She has had breast imaging.  My concern is for shoulder pathology.  Will attempt to get MRI of shoulder to assess.  If unable to get MRI approved, will refer to ortho for complete evaluation.  Pt expressed understanding and is in agreement w/ plan.

## 2015-11-30 ENCOUNTER — Other Ambulatory Visit: Payer: Self-pay | Admitting: Family Medicine

## 2015-11-30 ENCOUNTER — Ambulatory Visit
Admission: RE | Admit: 2015-11-30 | Discharge: 2015-11-30 | Disposition: A | Discharge status: Home / Self Care | Payer: Medicare Other | Source: Ambulatory Visit | Attending: Family Medicine | Admitting: Family Medicine

## 2015-11-30 DIAGNOSIS — M25511 Pain in right shoulder: Secondary | ICD-10-CM | POA: Diagnosis not present

## 2015-11-30 DIAGNOSIS — M6780 Other specified disorders of synovium and tendon, unspecified site: Secondary | ICD-10-CM

## 2015-11-30 DIAGNOSIS — M79621 Pain in right upper arm: Secondary | ICD-10-CM

## 2015-11-30 MED ORDER — GADOBENATE DIMEGLUMINE 529 MG/ML IV SOLN
16.0000 mL | Freq: Once | INTRAVENOUS | Status: AC | PRN
  Administered 2015-11-30: 16 mL via INTRAVENOUS

## 2015-12-01 ENCOUNTER — Telehealth: Payer: Self-pay | Admitting: Family Medicine

## 2015-12-01 NOTE — Telephone Encounter (Signed)
Pt stopped by yesterday with a question about her MRI and would like a call back.

## 2015-12-02 NOTE — Telephone Encounter (Signed)
Called patient and LMOVM to return call.     

## 2015-12-02 NOTE — Telephone Encounter (Signed)
Patient returned your call while you were at lunch, please call her back at [phone]

## 2015-12-03 NOTE — Telephone Encounter (Signed)
Pt informed that her referral was placed to [facility] and they will call to schedule her.

## 2015-12-11 DIAGNOSIS — M7541 Impingement syndrome of right shoulder: Secondary | ICD-10-CM | POA: Diagnosis not present

## 2015-12-25 DIAGNOSIS — M79621 Pain in right upper arm: Secondary | ICD-10-CM | POA: Diagnosis not present

## 2015-12-29 DIAGNOSIS — M79621 Pain in right upper arm: Secondary | ICD-10-CM | POA: Diagnosis not present

## 2015-12-31 DIAGNOSIS — M79621 Pain in right upper arm: Secondary | ICD-10-CM | POA: Diagnosis not present

## 2016-01-04 DIAGNOSIS — M79621 Pain in right upper arm: Secondary | ICD-10-CM | POA: Diagnosis not present

## 2016-01-07 DIAGNOSIS — M79621 Pain in right upper arm: Secondary | ICD-10-CM | POA: Diagnosis not present

## 2016-01-08 DIAGNOSIS — M7541 Impingement syndrome of right shoulder: Secondary | ICD-10-CM | POA: Diagnosis not present

## 2016-01-18 DIAGNOSIS — M79621 Pain in right upper arm: Secondary | ICD-10-CM | POA: Diagnosis not present

## 2016-01-21 DIAGNOSIS — M79621 Pain in right upper arm: Secondary | ICD-10-CM | POA: Diagnosis not present

## 2016-01-27 ENCOUNTER — Telehealth: Payer: Self-pay | Admitting: Family Medicine

## 2016-01-27 DIAGNOSIS — M79621 Pain in right upper arm: Secondary | ICD-10-CM | POA: Diagnosis not present

## 2016-01-27 MED ORDER — PREDNISONE 10 MG PO TABS: ORAL_TABLET | ORAL | Status: DC

## 2016-01-27 NOTE — Telephone Encounter (Signed)
Medication filled to pharmacy as requested.   

## 2016-01-27 NOTE — Telephone Encounter (Signed)
Pt can take 30mg  (3 tabs) w/ food 1 hr prior to takeoff both coming and going (#6)

## 2016-01-27 NOTE — Telephone Encounter (Signed)
Pt asking for a Rx for prednisone, pt states that she is going on a flight and was told that this would help. [facility] in [place]

## 2016-01-27 NOTE — Telephone Encounter (Signed)
Patient notified of PCP recommendations and is agreement and expresses an understanding.  

## 2016-01-27 NOTE — Telephone Encounter (Signed)
Spoke with pt who advised that she has a history with her ears swelling, she also experiences severe pain and also had her ear drum ruptured in the past. Pt states that she has tried everything in the past from decongestants, drinking and eating to no avail. Pt states that her brother has same condition and his PCP has given him prednisone and this has helped him. Pt states that before she switched to you Dr. Deatra Ina had told her that their office would prescribe this for her. Please advise

## 2016-01-27 NOTE — Telephone Encounter (Signed)
What is she trying to help w/ the prednisone?  This is not something we usually prescribe prior to problems arising

## 2016-01-29 DIAGNOSIS — M79621 Pain in right upper arm: Secondary | ICD-10-CM | POA: Diagnosis not present

## 2016-02-01 DIAGNOSIS — M79621 Pain in right upper arm: Secondary | ICD-10-CM | POA: Diagnosis not present

## 2016-02-03 ENCOUNTER — Telehealth: Payer: Self-pay | Admitting: Family Medicine

## 2016-02-03 NOTE — Telephone Encounter (Signed)
Pt can be scheduled for a CPE. However we can order labs but due to insurance they may not be covered by insurance without the office visit

## 2016-02-03 NOTE — Telephone Encounter (Signed)
Pt is calling to ask to schedule her CPE, with labs prior to her appt due to taking thyroid meds. I see that pt has not had her NP to est care appt. She came in for acute pain. Does this need to be scheduled for NP or CPE and can she came in prior to this appt for labs?

## 2016-02-04 DIAGNOSIS — M79621 Pain in right upper arm: Secondary | ICD-10-CM | POA: Diagnosis not present

## 2016-02-04 NOTE — Telephone Encounter (Signed)
Pt has been scheduled.  °

## 2016-02-05 DIAGNOSIS — M7541 Impingement syndrome of right shoulder: Secondary | ICD-10-CM | POA: Diagnosis not present

## 2016-03-21 ENCOUNTER — Telehealth: Payer: Self-pay | Admitting: Family Medicine

## 2016-03-21 NOTE — Telephone Encounter (Signed)
Patient notified of PCP recommendations and is agreement and expresses an understanding.  

## 2016-03-21 NOTE — Telephone Encounter (Signed)
Pt asking for labs to be drawn before her cpe appt, pt has called ins and states as long as KT states that they were needed be fore appt ins will pay, pt does take meds for thyroid. Can an order be placed for scheduling?

## 2016-03-21 NOTE — Telephone Encounter (Signed)
Since she has red/white/blue medicare and we don't have record of supplemental insurance, I would recommend we do the visit first and then order the labs b/c they are VERY strict on what dx's they use for labs and if not done correctly, she will end up paying out of pocket.

## 2016-03-30 ENCOUNTER — Ambulatory Visit (INDEPENDENT_AMBULATORY_CARE_PROVIDER_SITE_OTHER): Payer: Medicare Other | Admitting: Family Medicine

## 2016-03-30 ENCOUNTER — Encounter: Payer: Self-pay | Admitting: Family Medicine

## 2016-03-30 VITALS — BP 123/83 | HR 82 | Temp 99.4°F | Resp 17 | Ht 65.0 in | Wt 190.2 lb

## 2016-03-30 DIAGNOSIS — J0100 Acute maxillary sinusitis, unspecified: Secondary | ICD-10-CM

## 2016-03-30 MED ORDER — SULFAMETHOXAZOLE-TRIMETHOPRIM 800-160 MG PO TABS: 1.0000 | ORAL_TABLET | Freq: Two times a day (BID) | ORAL | 0 refills | Status: DC

## 2016-03-30 NOTE — Progress Notes (Signed)
Pre visit review using our clinic review tool, if applicable. No additional management support is needed unless otherwise documented below in the visit note. 

## 2016-03-30 NOTE — Patient Instructions (Signed)
Follow up as needed/scheduled Start the Bactrim twice daily- take w/ food Drink plenty of fluids REST! Mucinex DM for cough/congestion Call with any questions or concerns Hang in there!!!

## 2016-03-30 NOTE — Progress Notes (Signed)
   Subjective:    Patient ID: Karen Jones, female    DOB: [birth year], 65 y.o.   MRN: [id number]  HPI URI- sxs started over the weekend w/ sore throat.  Progressed to runny nose and nasal congestion.  Yesterday developed 'hacking cough' which worsened as the day went on.  Last night had HA and facial pressure.  Now w/ swollen glands.  Subjective temp yesterday.  No known sick contacts.  No tooth pain, no N/V.   Review of Systems For ROS see HPI     Objective:   Physical Exam  Constitutional: She appears well-developed and well-nourished. No distress.  HENT:  Head: Normocephalic and atraumatic.  Right Ear: Tympanic membrane normal.  Left Ear: Tympanic membrane normal.  Nose: Mucosal edema and rhinorrhea present. Right sinus exhibits maxillary sinus tenderness and frontal sinus tenderness. Left sinus exhibits maxillary sinus tenderness and frontal sinus tenderness.  Mouth/Throat: Uvula is midline and mucous membranes are normal. Posterior oropharyngeal erythema present. No oropharyngeal exudate.  Eyes: Conjunctivae and EOM are normal. Pupils are equal, round, and reactive to light.  Neck: Normal range of motion. Neck supple.  Cardiovascular: Normal rate, regular rhythm and normal heart sounds.   Pulmonary/Chest: Effort normal and breath sounds normal. No respiratory distress. She has no wheezes.  No cough heard  Lymphadenopathy:    She has no cervical adenopathy.  Vitals reviewed.         Assessment & Plan:  Sinusitis- new.  Pt's sxs and PE consistent w/ infxn.  Start abx.  Cough meds prn.  Reviewed supportive care and red flags that should prompt return.  Pt expressed understanding and is in agreement w/ plan.

## 2016-04-21 ENCOUNTER — Ambulatory Visit (INDEPENDENT_AMBULATORY_CARE_PROVIDER_SITE_OTHER): Payer: Medicare Other | Admitting: Family Medicine

## 2016-04-21 ENCOUNTER — Encounter: Payer: Self-pay | Admitting: Family Medicine

## 2016-04-21 VITALS — BP 122/80 | HR 74 | Temp 98.4°F | Resp 16 | Ht 65.0 in | Wt 190.1 lb

## 2016-04-21 DIAGNOSIS — E039 Hypothyroidism, unspecified: Secondary | ICD-10-CM | POA: Diagnosis not present

## 2016-04-21 DIAGNOSIS — Z0000 Encounter for general adult medical examination without abnormal findings: Secondary | ICD-10-CM

## 2016-04-21 DIAGNOSIS — Z780 Asymptomatic menopausal state: Secondary | ICD-10-CM | POA: Diagnosis not present

## 2016-04-21 DIAGNOSIS — E669 Obesity, unspecified: Secondary | ICD-10-CM | POA: Diagnosis not present

## 2016-04-21 DIAGNOSIS — Z23 Encounter for immunization: Secondary | ICD-10-CM | POA: Diagnosis not present

## 2016-04-21 LAB — BASIC METABOLIC PANEL
BUN: 18 mg/dL (ref 7–25)
CALCIUM: 9.1 mg/dL (ref 8.6–10.4)
CHLORIDE: 106 mmol/L (ref 98–110)
CO2: 26 mmol/L (ref 20–31)
CREATININE: 1.05 mg/dL — AB (ref 0.50–0.99)
Glucose, Bld: 89 mg/dL (ref 65–99)
Potassium: 4.4 mmol/L (ref 3.5–5.3)
Sodium: 142 mmol/L (ref 135–146)

## 2016-04-21 LAB — CBC WITH DIFFERENTIAL/PLATELET
Basophils Absolute: 58 cells/uL (ref 0–200)
Basophils Relative: 1 %
EOS PCT: 5 %
Eosinophils Absolute: 290 cells/uL (ref 15–500)
HCT: 40.1 % (ref 35.0–45.0)
HEMOGLOBIN: 12.9 g/dL (ref 11.7–15.5)
LYMPHS ABS: 1856 {cells}/uL (ref 850–3900)
Lymphocytes Relative: 32 %
MCH: 29.5 pg (ref 27.0–33.0)
MCHC: 32.2 g/dL (ref 32.0–36.0)
MCV: 91.8 fL (ref 80.0–100.0)
MPV: 9.4 fL (ref 7.5–12.5)
Monocytes Absolute: 464 cells/uL (ref 200–950)
Monocytes Relative: 8 %
NEUTROS PCT: 54 %
Neutro Abs: 3132 cells/uL (ref 1500–7800)
Platelets: 241 10*3/uL (ref 140–400)
RBC: 4.37 MIL/uL (ref 3.80–5.10)
RDW: 13.3 % (ref 11.0–15.0)
WBC: 5.8 10*3/uL (ref 3.8–10.8)

## 2016-04-21 LAB — HEPATIC FUNCTION PANEL
ALBUMIN: 4 g/dL (ref 3.6–5.1)
ALT: 11 U/L (ref 6–29)
AST: 18 U/L (ref 10–35)
Alkaline Phosphatase: 42 U/L (ref 33–130)
BILIRUBIN TOTAL: 0.3 mg/dL (ref 0.2–1.2)
Bilirubin, Direct: 0.1 mg/dL (ref <=0.2)
Indirect Bilirubin: 0.2 mg/dL (ref 0.2–1.2)
Total Protein: 6.2 g/dL (ref 6.1–8.1)

## 2016-04-21 LAB — LIPID PANEL
CHOLESTEROL: 169 mg/dL (ref 125–200)
HDL: 52 mg/dL (ref >=46)
LDL Cholesterol: 85 mg/dL (ref <130)
TRIGLYCERIDES: 162 mg/dL — AB (ref <150)
Total CHOL/HDL Ratio: 3.3 Ratio (ref <=5.0)
VLDL: 32 mg/dL — AB (ref <30)

## 2016-04-21 LAB — TSH: TSH: 0.83 mIU/L

## 2016-04-21 NOTE — Progress Notes (Signed)
Pre visit review using our clinic review tool, if applicable. No additional management support is needed unless otherwise documented below in the visit note. 

## 2016-04-21 NOTE — Patient Instructions (Signed)
Follow up in 1 year or as needed We'll notify you of your lab results and make any changes if needed Continue to work on healthy diet and regular exercise- you can do it!!! You are up to date on colonoscopy until 2020- yay! You are up to date on your mammogram until March We'll call you with your bone density appt Call with any questions or concerns Enjoy the [facility]!!!

## 2016-04-21 NOTE — Progress Notes (Signed)
   Subjective:    Patient ID: Karen Jones, female    DOB: 10-08-1950, 65 y.o.   MRN: [id number]  HPI Here today for CPE.  Risk Factors: Obesity- pt's BMI is 31.  Pt is not exercising but she is trying to eat better. Hypothyroid- chronic problem, on Synthroid 31mcg daily.  Denies excessive fatigue, diarrhea/constipation, + dry skin. Physical Activity: limited activity due to heat Fall Risk: low Depression: denies current sxs Hearing: hearing aides are pending for decreased hearing ADL's: independent Cognitive: normal linear thought process, memory and attention intact Home Safety: safe at home, lives w/ husband Height, Weight, BMI, Visual Acuity: see vitals, vision corrected to 20/20 w/ glasses Counseling: UTD on colonoscopy (due 2020), mammo, pap.  Due for flu shot.  Due for DEXA. Care team reviewed and updated Labs Ordered: See A&P Care Plan: See A&P    Review of Systems Patient reports no vision, adenopathy,fever, weight change,  persistant/recurrent hoarseness , swallowing issues, chest pain, palpitations, edema, persistant/recurrent cough, hemoptysis, dyspnea (rest/exertional/paroxysmal nocturnal), gastrointestinal bleeding (melena, rectal bleeding), abdominal pain, significant heartburn, bowel changes, GU symptoms (dysuria, hematuria, incontinence), Gyn symptoms (abnormal  bleeding, pain),  syncope, focal weakness, memory loss, numbness & tingling, skin/hair/nail changes, abnormal bruising or bleeding, anxiety, or depression.     Objective:   Physical Exam General Appearance:    Alert, cooperative, no distress, appears stated age  Head:    Normocephalic, without obvious abnormality, atraumatic  Eyes:    PERRL, conjunctiva/corneas clear, EOM's intact, fundi    benign, both eyes  Ears:    Normal TM's and external ear canals, both ears  Nose:   Nares normal, septum midline, mucosa normal, no drainage    or sinus tenderness  Throat:   Lips, mucosa, and tongue normal; teeth and  gums normal  Neck:   Supple, symmetrical, trachea midline, no adenopathy;    Thyroid: no enlargement/tenderness/nodules  Back:     Symmetric, no curvature, ROM normal, no CVA tenderness  Lungs:     Clear to auscultation bilaterally, respirations unlabored  Chest Wall:    No tenderness or deformity   Heart:    Regular rate and rhythm, S1 and S2 normal, no murmur, rub   or gallop  Breast Exam:    Deferred to mammo  Abdomen:     Soft, non-tender, bowel sounds active all four quadrants,    no masses, no organomegaly  Genitalia:    Deferred  Rectal:    Extremities:   Extremities normal, atraumatic, no cyanosis or edema  Pulses:   2+ and symmetric all extremities  Skin:   Skin color, texture, turgor normal, no rashes or lesions  Lymph nodes:   Cervical, supraclavicular, and axillary nodes normal  Neurologic:   CNII-XII intact, normal strength, sensation and reflexes    throughout          Assessment & Plan:  PE- pt's PE WNL w/ exception of obesity.  UTD on mammo, pap, colonoscopy.  Flu shot given today.  Due for DEXA (ordered).  Written screening schedule updated and given to pt.  Check labs.  Anticipatory guidance provided.   Obesity- stressed need for healthy diet and regular exercise which pt admits she is not doing either.  Check labs to risk stratify and continue to follow closely  Hypothyroid- chronic problem.  Asymptomatic w/ exception of dry skin.  Check labs.  Adjust meds prn.

## 2016-05-03 ENCOUNTER — Encounter: Payer: Self-pay | Admitting: Family Medicine

## 2016-05-10 ENCOUNTER — Ambulatory Visit
Admission: RE | Admit: 2016-05-10 | Discharge: 2016-05-10 | Disposition: A | Discharge status: Home / Self Care | Payer: Medicare Other | Source: Ambulatory Visit | Attending: Family Medicine | Admitting: Family Medicine

## 2016-05-10 DIAGNOSIS — Z780 Asymptomatic menopausal state: Secondary | ICD-10-CM

## 2016-05-10 DIAGNOSIS — M85852 Other specified disorders of bone density and structure, left thigh: Secondary | ICD-10-CM | POA: Diagnosis not present

## 2016-05-26 ENCOUNTER — Ambulatory Visit (INDEPENDENT_AMBULATORY_CARE_PROVIDER_SITE_OTHER): Payer: Medicare Other | Admitting: Emergency Medicine

## 2016-05-26 DIAGNOSIS — Z23 Encounter for immunization: Secondary | ICD-10-CM | POA: Diagnosis not present

## 2016-05-30 DIAGNOSIS — I8312 Varicose veins of left lower extremity with inflammation: Secondary | ICD-10-CM | POA: Diagnosis not present

## 2016-05-30 DIAGNOSIS — I8311 Varicose veins of right lower extremity with inflammation: Secondary | ICD-10-CM | POA: Diagnosis not present

## 2016-05-30 DIAGNOSIS — I83893 Varicose veins of bilateral lower extremities with other complications: Secondary | ICD-10-CM | POA: Diagnosis not present

## 2016-06-08 DIAGNOSIS — M79605 Pain in left leg: Secondary | ICD-10-CM | POA: Diagnosis not present

## 2016-06-08 DIAGNOSIS — M7981 Nontraumatic hematoma of soft tissue: Secondary | ICD-10-CM | POA: Diagnosis not present

## 2016-06-08 DIAGNOSIS — M79604 Pain in right leg: Secondary | ICD-10-CM | POA: Diagnosis not present

## 2016-06-23 ENCOUNTER — Encounter: Payer: Self-pay | Admitting: Family Medicine

## 2016-06-28 DIAGNOSIS — Z08 Encounter for follow-up examination after completed treatment for malignant neoplasm: Secondary | ICD-10-CM | POA: Diagnosis not present

## 2016-06-28 DIAGNOSIS — D225 Melanocytic nevi of trunk: Secondary | ICD-10-CM | POA: Diagnosis not present

## 2016-06-28 DIAGNOSIS — Z85828 Personal history of other malignant neoplasm of skin: Secondary | ICD-10-CM | POA: Diagnosis not present

## 2016-06-28 DIAGNOSIS — L820 Inflamed seborrheic keratosis: Secondary | ICD-10-CM | POA: Diagnosis not present

## 2016-06-29 DIAGNOSIS — H903 Sensorineural hearing loss, bilateral: Secondary | ICD-10-CM | POA: Diagnosis not present

## 2016-06-30 DIAGNOSIS — I83813 Varicose veins of bilateral lower extremities with pain: Secondary | ICD-10-CM | POA: Diagnosis not present

## 2016-07-06 DIAGNOSIS — H6981 Other specified disorders of Eustachian tube, right ear: Secondary | ICD-10-CM | POA: Diagnosis not present

## 2016-07-06 DIAGNOSIS — H9201 Otalgia, right ear: Secondary | ICD-10-CM | POA: Insufficient documentation

## 2016-07-06 DIAGNOSIS — H903 Sensorineural hearing loss, bilateral: Secondary | ICD-10-CM | POA: Diagnosis not present

## 2016-07-08 DIAGNOSIS — H6981 Other specified disorders of Eustachian tube, right ear: Secondary | ICD-10-CM | POA: Diagnosis not present

## 2016-07-08 DIAGNOSIS — H9201 Otalgia, right ear: Secondary | ICD-10-CM | POA: Diagnosis not present

## 2016-07-27 DIAGNOSIS — I83891 Varicose veins of right lower extremities with other complications: Secondary | ICD-10-CM | POA: Diagnosis not present

## 2016-07-27 DIAGNOSIS — I8311 Varicose veins of right lower extremity with inflammation: Secondary | ICD-10-CM | POA: Diagnosis not present

## 2016-08-09 DIAGNOSIS — Z9622 Myringotomy tube(s) status: Secondary | ICD-10-CM | POA: Insufficient documentation

## 2016-08-09 DIAGNOSIS — H903 Sensorineural hearing loss, bilateral: Secondary | ICD-10-CM | POA: Diagnosis not present

## 2016-08-09 DIAGNOSIS — H6981 Other specified disorders of Eustachian tube, right ear: Secondary | ICD-10-CM | POA: Diagnosis not present

## 2016-08-15 DIAGNOSIS — I83891 Varicose veins of right lower extremities with other complications: Secondary | ICD-10-CM | POA: Diagnosis not present

## 2016-08-15 DIAGNOSIS — M7981 Nontraumatic hematoma of soft tissue: Secondary | ICD-10-CM | POA: Diagnosis not present

## 2016-08-15 DIAGNOSIS — I83811 Varicose veins of right lower extremities with pain: Secondary | ICD-10-CM | POA: Diagnosis not present

## 2016-08-29 ENCOUNTER — Other Ambulatory Visit: Payer: Self-pay | Admitting: Family Medicine

## 2016-08-29 ENCOUNTER — Telehealth: Payer: Self-pay | Admitting: Family Medicine

## 2016-08-29 DIAGNOSIS — Z1231 Encounter for screening mammogram for malignant neoplasm of breast: Secondary | ICD-10-CM

## 2016-08-29 MED ORDER — LEVOTHYROXINE SODIUM 75 MCG PO TABS: 75.0000 ug | ORAL_TABLET | Freq: Every day | ORAL | 1 refills | Status: DC

## 2016-08-29 NOTE — Telephone Encounter (Signed)
Patient requesting refill of levothyroxine (SYNTHROID, LEVOTHROID) 75 MCG tablet.  States Dr. Mechele Dawley has been prescribing this for her previously.  Pharmacy:  [place]

## 2016-08-29 NOTE — Telephone Encounter (Signed)
Medication filled to pharmacy as requested.   

## 2016-09-01 DIAGNOSIS — H9221 Otorrhagia, right ear: Secondary | ICD-10-CM | POA: Insufficient documentation

## 2016-09-01 DIAGNOSIS — Z9622 Myringotomy tube(s) status: Secondary | ICD-10-CM | POA: Diagnosis not present

## 2016-09-01 DIAGNOSIS — H903 Sensorineural hearing loss, bilateral: Secondary | ICD-10-CM | POA: Diagnosis not present

## 2016-09-01 DIAGNOSIS — H6981 Other specified disorders of Eustachian tube, right ear: Secondary | ICD-10-CM | POA: Diagnosis not present

## 2016-09-07 DIAGNOSIS — I8312 Varicose veins of left lower extremity with inflammation: Secondary | ICD-10-CM | POA: Diagnosis not present

## 2016-09-07 DIAGNOSIS — I83892 Varicose veins of left lower extremities with other complications: Secondary | ICD-10-CM | POA: Diagnosis not present

## 2016-09-21 DIAGNOSIS — I8311 Varicose veins of right lower extremity with inflammation: Secondary | ICD-10-CM | POA: Diagnosis not present

## 2016-09-21 DIAGNOSIS — M7981 Nontraumatic hematoma of soft tissue: Secondary | ICD-10-CM | POA: Diagnosis not present

## 2016-09-21 DIAGNOSIS — I83891 Varicose veins of right lower extremities with other complications: Secondary | ICD-10-CM | POA: Diagnosis not present

## 2016-10-06 ENCOUNTER — Ambulatory Visit: Payer: Medicare Other

## 2016-10-10 ENCOUNTER — Ambulatory Visit
Admission: RE | Admit: 2016-10-10 | Discharge: 2016-10-10 | Disposition: A | Discharge status: Home / Self Care | Payer: Medicare Other | Source: Ambulatory Visit | Attending: Family Medicine | Admitting: Family Medicine

## 2016-10-10 DIAGNOSIS — Z1231 Encounter for screening mammogram for malignant neoplasm of breast: Secondary | ICD-10-CM | POA: Diagnosis not present

## 2016-10-11 DIAGNOSIS — I83892 Varicose veins of left lower extremities with other complications: Secondary | ICD-10-CM | POA: Diagnosis not present

## 2016-10-11 DIAGNOSIS — I8312 Varicose veins of left lower extremity with inflammation: Secondary | ICD-10-CM | POA: Diagnosis not present

## 2016-11-11 DIAGNOSIS — M7981 Nontraumatic hematoma of soft tissue: Secondary | ICD-10-CM | POA: Diagnosis not present

## 2016-11-11 DIAGNOSIS — I8311 Varicose veins of right lower extremity with inflammation: Secondary | ICD-10-CM | POA: Diagnosis not present

## 2016-11-24 ENCOUNTER — Other Ambulatory Visit: Payer: Self-pay | Admitting: Family Medicine

## 2016-12-01 DIAGNOSIS — M7981 Nontraumatic hematoma of soft tissue: Secondary | ICD-10-CM | POA: Diagnosis not present

## 2016-12-14 ENCOUNTER — Encounter: Payer: Self-pay | Admitting: Family Medicine

## 2016-12-14 MED ORDER — MIRABEGRON ER 50 MG PO TB24: 50.0000 mg | ORAL_TABLET | Freq: Every day | ORAL | 1 refills | Status: DC

## 2017-01-24 DIAGNOSIS — H6981 Other specified disorders of Eustachian tube, right ear: Secondary | ICD-10-CM | POA: Diagnosis not present

## 2017-01-24 DIAGNOSIS — Z9622 Myringotomy tube(s) status: Secondary | ICD-10-CM | POA: Diagnosis not present

## 2017-02-28 ENCOUNTER — Other Ambulatory Visit: Payer: Self-pay | Admitting: Emergency Medicine

## 2017-02-28 MED ORDER — LEVOTHYROXINE SODIUM 75 MCG PO TABS: ORAL_TABLET | ORAL | 1 refills | Status: DC

## 2017-03-23 DIAGNOSIS — I83813 Varicose veins of bilateral lower extremities with pain: Secondary | ICD-10-CM | POA: Diagnosis not present

## 2017-03-30 IMAGING — MR MR SHOULDER*R* WO/W CM
5 of 8 series · 23 of 40 positions shown · IV contrast (multihance)
Comparison: None.

CLINICAL DATA: Right shoulder pain. Pain in the axilla in right
shoulder.

EXAM:
MRI OF THE RIGHT SHOULDER WITHOUT AND WITH CONTRAST
TECHNIQUE: Multiplanar, multisequence MR imaging of the right shoulder was
performed following the administration of intravenous contrast.
CONTRAST:  16mL MULTIHANCE GADOBENATE DIMEGLUMINE 529 MG/ML IV SOLN

[Series 3: T2 fat-sat · axial · 4.0mm · 0.25mm/px · z∈[-68,+64]mm · 6 of 30 slices shown (1 of 3)]
[im 1/30]
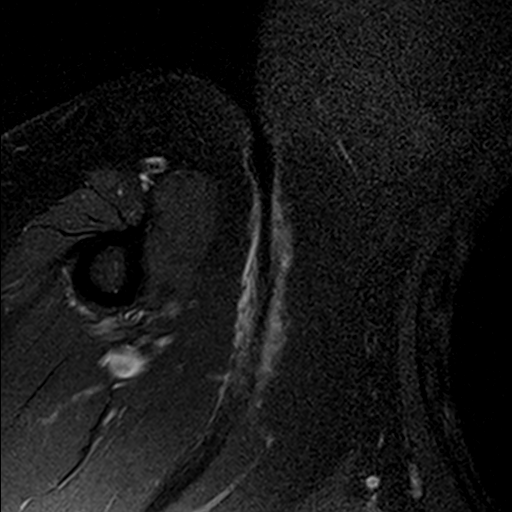
[im 6/30]
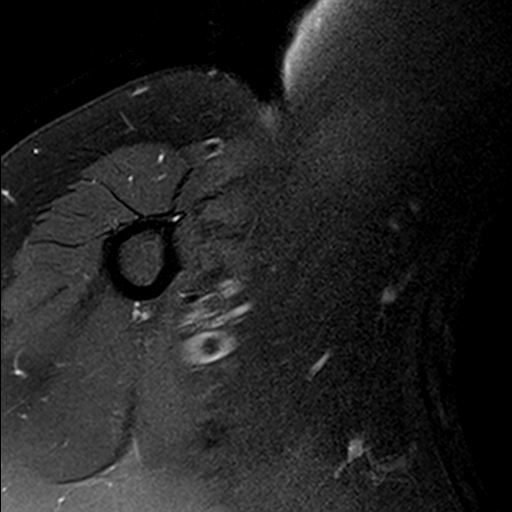
[im 12/30]
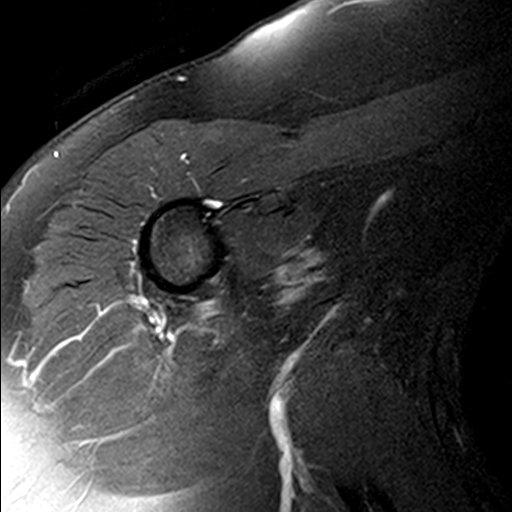
[im 18/30]
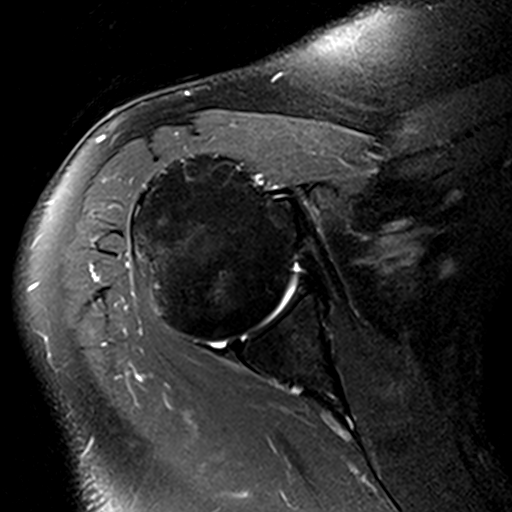
[im 24/30]
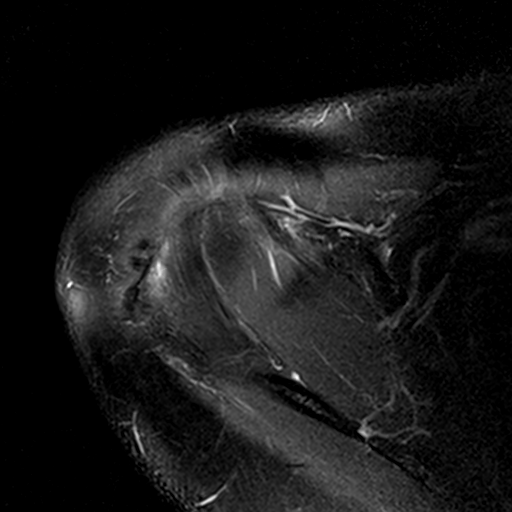
[im 30/30]
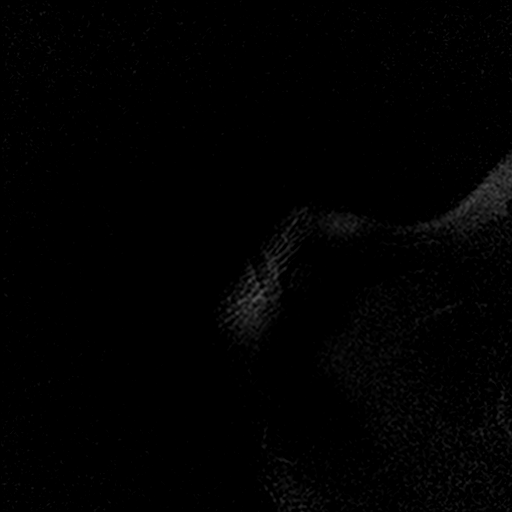

[Series 4: T2 fat-sat · oblique · 4.0mm · 0.62mm/px · 4 of 20 slices shown (2 of 3)]
[im 1/20]
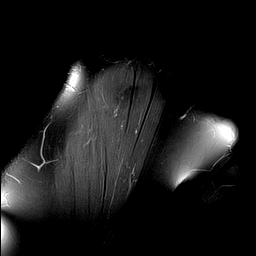
[im 7/20]
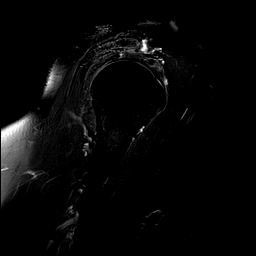
[im 13/20]
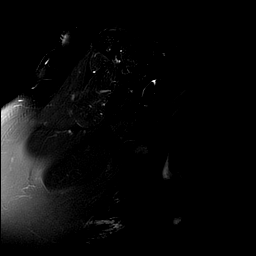
[im 20/20]
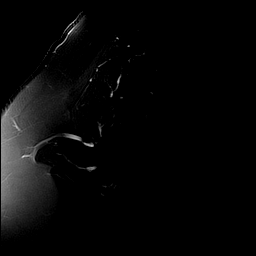

[Series 5: T1 · oblique · 4.0mm · 0.25mm/px · 3 of 20 slices shown]
[im 1/20]
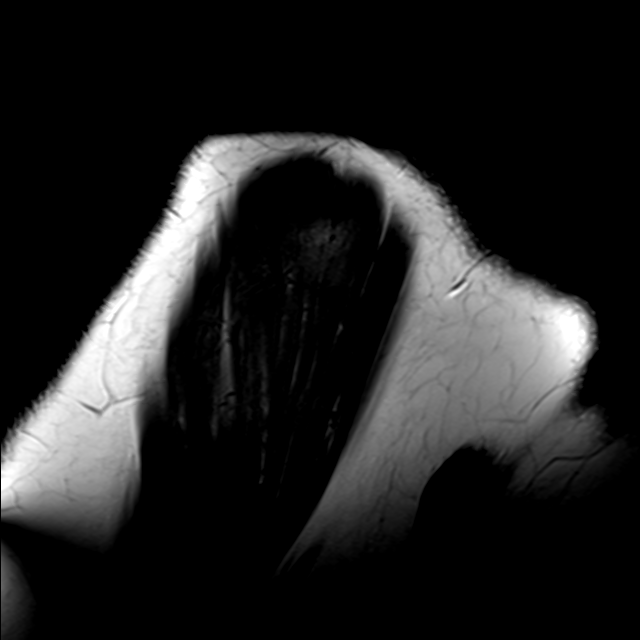
[im 5/20]
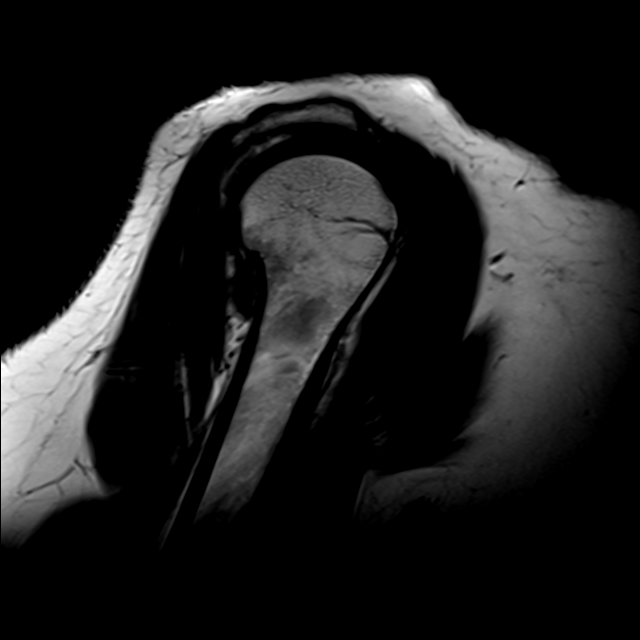
[im 10/20]
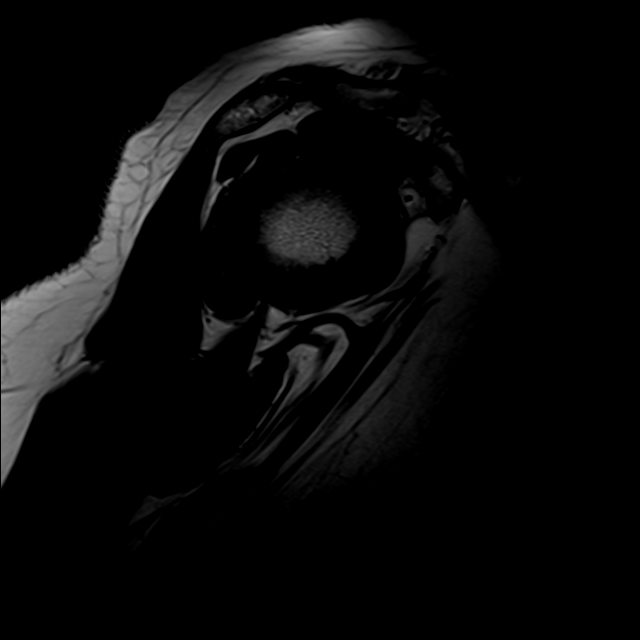

[Series 6: T2 fat-sat · oblique · 4.0mm · 0.62mm/px · 5 of 20 slices shown (3 of 3)]
[im 1/20]
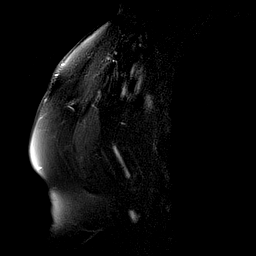
[im 5/20]
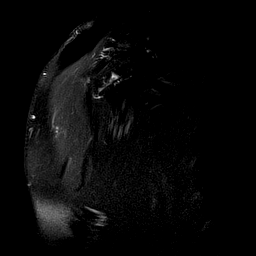
[im 10/20]
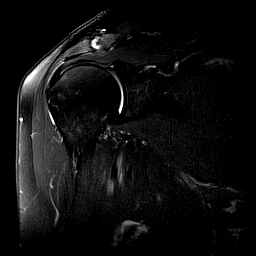
[im 15/20]
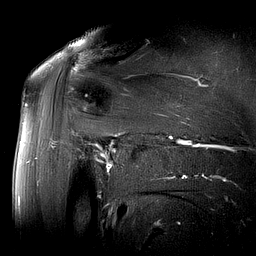
[im 20/20]
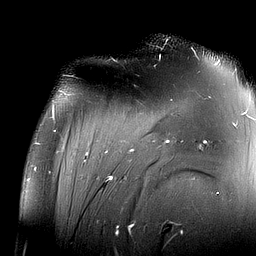

[Series 7: PD · oblique · 4.0mm · 0.31mm/px · 5 of 20 slices shown]
[im 1/20]
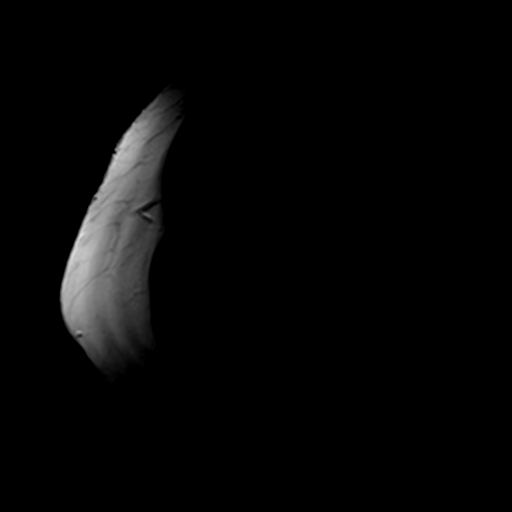
[im 5/20]
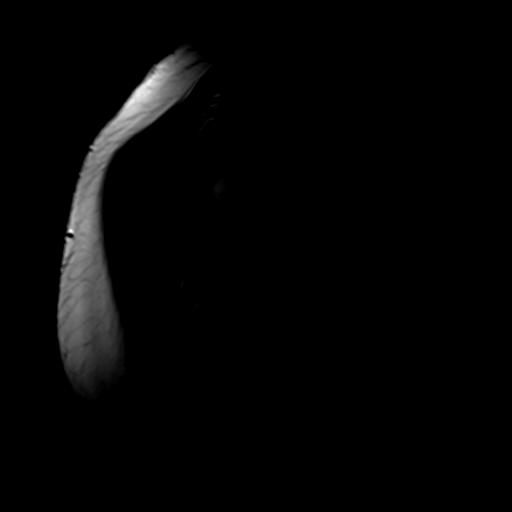
[im 10/20]
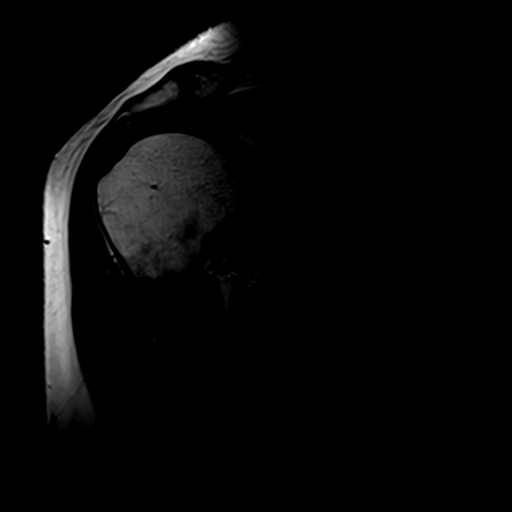
[im 15/20]
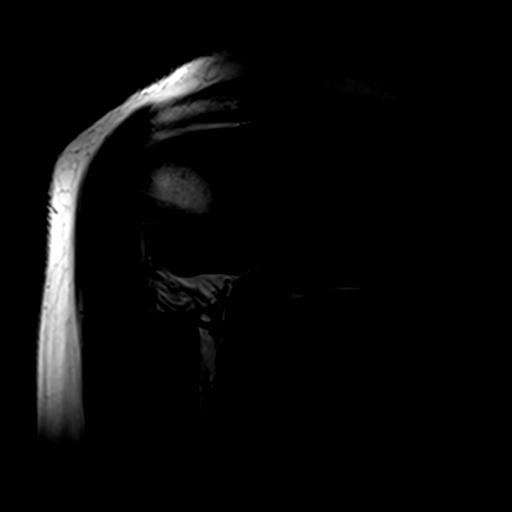
[im 20/20]
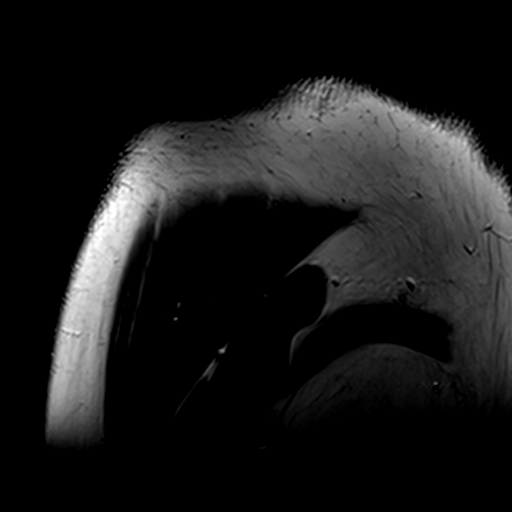

[23 of 40 positions shown; findings below may reference images not displayed]

FINDINGS: Rotator cuff: Mild tendinosis of the supraspinatus tendon without a
discrete tear. Infraspinatus tendon is intact. Teres minor tendon is
intact. Subscapularis tendon is intact.

Muscles: No atrophy or fatty replacement of nor abnormal signal
within, the muscles of the rotator cuff.

Biceps long head: Mild tendinosis of the intraarticular portion of
the long head of the biceps tendon.

Acromioclavicular Joint: Mild degenerative change of the
acromioclavicular joint. Type II acromion. No subacromial/
subdeltoid bursal fluid.

Glenohumeral Joint: No joint effusion.  No chondral defect.

Labrum: Grossly intact, but evaluation is limited by lack of
intraarticular fluid.

Bones:  No marrow signal abnormality.  No fracture or dislocation.

Soft tissue: No soft tissue mass. No fluid collection or hematoma.
No area of abnormal enhancement.
IMPRESSION: 1. Mild tendinosis of the supraspinatus tendon without a discrete
tear.
2. Mild tendinosis of the intraarticular portion of the long head of
the biceps tendon.

## 2017-04-13 ENCOUNTER — Encounter: Payer: Self-pay | Admitting: Family Medicine

## 2017-04-13 ENCOUNTER — Ambulatory Visit (INDEPENDENT_AMBULATORY_CARE_PROVIDER_SITE_OTHER): Payer: Medicare Other | Admitting: Family Medicine

## 2017-04-13 VITALS — BP 122/82 | HR 75 | Temp 99.0°F | Resp 16 | Ht 65.0 in | Wt 192.2 lb

## 2017-04-13 DIAGNOSIS — J0100 Acute maxillary sinusitis, unspecified: Secondary | ICD-10-CM

## 2017-04-13 MED ORDER — DOXYCYCLINE HYCLATE 100 MG PO TABS: 100.0000 mg | ORAL_TABLET | Freq: Two times a day (BID) | ORAL | 0 refills | Status: DC

## 2017-04-13 NOTE — Progress Notes (Signed)
   Subjective:    Patient ID: Karen Jones, female    DOB: 1951/02/24, 66 y.o.   MRN: [id number]  HPI URI- sxs started ~1 month ago w/ nasal congestion.  Now w/ HA, facial pain/pressure.  No fevers.  No ear pain.  + tooth pain.  + dry, sore throat.  Pt is not able to get much when she blows her nose.  Pt has pain w/ leaning forward and dizziness when lying down.    Review of Systems For ROS see HPI     Objective:   Physical Exam  Constitutional: She is oriented to person, place, and time. She appears well-developed and well-nourished. No distress.  HENT:  Head: Normocephalic and atraumatic.  Right Ear: Tympanic membrane normal.  Left Ear: Tympanic membrane normal.  Nose: Mucosal edema and rhinorrhea present. Right sinus exhibits maxillary sinus tenderness and frontal sinus tenderness. Left sinus exhibits maxillary sinus tenderness and frontal sinus tenderness.  Mouth/Throat: Uvula is midline and mucous membranes are normal. Posterior oropharyngeal erythema present. No oropharyngeal exudate.  Eyes: Pupils are equal, round, and reactive to light. Conjunctivae and EOM are normal.  Neck: Normal range of motion. Neck supple.  Cardiovascular: Normal rate, regular rhythm and normal heart sounds.   Pulmonary/Chest: Effort normal and breath sounds normal. No respiratory distress. She has no wheezes.  Lymphadenopathy:    She has no cervical adenopathy.  Neurological: She is alert and oriented to person, place, and time.  Skin: Skin is warm and dry.  Psychiatric: She has a normal mood and affect. Her behavior is normal. Thought content normal.  Vitals reviewed.         Assessment & Plan:  Sinusitis- new.  Pt's sxs and PE consistent w/ infxn.  Start abx due to duration of illness and worsening rather than improving.  Reviewed supportive care and red flags that should prompt return.  Pt expressed understanding and is in agreement w/ plan.

## 2017-04-13 NOTE — Progress Notes (Signed)
Pre visit review using our clinic review tool, if applicable. No additional management support is needed unless otherwise documented below in the visit note. 

## 2017-04-13 NOTE — Patient Instructions (Signed)
Follow up as needed or as scheduled Start the Doxycycline twice daily- take w/ food Drink plenty of fluids Allergy medication daily Meclizine as needed for dizziness Call with any questions or concerns Hang in there!!!

## 2017-04-26 ENCOUNTER — Ambulatory Visit: Payer: Medicare Other | Admitting: Family Medicine

## 2017-04-26 ENCOUNTER — Ambulatory Visit (INDEPENDENT_AMBULATORY_CARE_PROVIDER_SITE_OTHER): Payer: Medicare Other

## 2017-04-26 DIAGNOSIS — Z23 Encounter for immunization: Secondary | ICD-10-CM

## 2017-05-11 DIAGNOSIS — H903 Sensorineural hearing loss, bilateral: Secondary | ICD-10-CM | POA: Diagnosis not present

## 2017-05-17 NOTE — Progress Notes (Addendum)
Subjective:   Karen Jones is a 66 y.o. female who presents for Medicare Annual (Subsequent) preventive examination.  Review of Systems:  No ROS.  Medicare Wellness Visit. Additional risk factors are reflected in the social history.  Cardiac Risk Factors include: advanced age (>45men, >26 women);family history of premature cardiovascular disease   Sleep patterns: Sleeps 8 hours.  Home Safety/Smoke Alarms: Feels safe in home. Smoke alarms in place.  Living environment; residence and Firearm Safety: Lives with husband and son in 1 story home.  Seat Belt Safety/Bike Helmet: Wears seat belt.   Female:   [id number]       Mammo-10/10/2016, negative.       Dexa scan-05/10/2016, Osteopenia     CCS-Colonoscopy 07/02/2014, normal. Recall 5 years.       Objective:     Vitals: BP 138/70 (BP Location: Left Arm, Patient Position: Sitting, Cuff Size: Normal)   Pulse 75   Temp 98.4 F (36.9 C) (Temporal)   Resp 18   Ht 5\' 5"  (1.651 m)   Wt 192 lb 12.8 oz (87.5 kg)   SpO2 98%   BMI 32.08 kg/m   Body mass index is 32.08 kg/m.   Tobacco History  Smoking Status  . Never Smoker  Smokeless Tobacco  . Never Used     Counseling given: Not Answered   Past Medical History:  Diagnosis Date  . Anemia   . Benign positional vertigo   . Cancer ([place])    skin  . Colon polyps   . Pain in limb   . Thyroid disease    Past Surgical History:  Procedure Laterality Date  . ABDOMINAL HYSTERECTOMY    . BILATERAL OOPHORECTOMY  1995  . COLONOSCOPY  1995,1997,2001,2005,2010   polyps removed every colonoscopy except 1 time,per pt  . INGUINAL HERNIA REPAIR    . POLYPECTOMY    . TONSILLECTOMY    . TUBAL LIGATION     Family History  Problem Relation Age of Onset  . Emphysema Mother   . Colon cancer Father 55  . Cancer Father   . Heart disease Father   . Heart attack Father   . Breast cancer Neg Hx    History  Sexual Activity  . Sexual activity: Not on file    Outpatient Encounter  Prescriptions as of 05/18/2017  Medication Sig  . levothyroxine (SYNTHROID, LEVOTHROID) 75 MCG tablet TAKE 1 TABLET BY MOUTH  DAILY BEFORE BREAKFAST  . loratadine (CLARITIN) 10 MG tablet Take 10 mg by mouth daily.  . meclizine (ANTIVERT) 25 MG tablet Take 25 mg by mouth 2 (two) times daily.   . mirabegron ER (MYRBETRIQ) 50 MG TB24 tablet Take 1 tablet (50 mg total) by mouth daily.  Marland Kitchen Zoster Vaccine Adjuvanted [facility]) injection Inject 0.5 mLs into the muscle once.  . [DISCONTINUED] doxycycline (VIBRA-TABS) 100 MG tablet Take 1 tablet (100 mg total) by mouth 2 (two) times daily. (Patient not taking: Reported on 05/18/2017)   No facility-administered encounter medications on file as of 05/18/2017.     Activities of Daily Living In your present state of health, do you have any difficulty performing the following activities: 05/18/2017  Hearing? N  Vision? N  Difficulty concentrating or making decisions? N  Walking or climbing stairs? N  Dressing or bathing? N  Doing errands, shopping? N  Preparing Food and eating ? N  Using the Toilet? N  In the past six months, have you accidently leaked urine? N  Do you have problems  with loss of bowel control? N  Managing your Medications? N  Managing your Finances? N  Housekeeping or managing your Housekeeping? N  Some recent data might be hidden    Patient Care Team: Midge Minium, MD as PCP - General (Family Medicine) Loletha Carrow Kirke Corin, MD as Consulting Physician (Gastroenterology) Linus Mako, MD as Consulting Physician (Family Medicine) Lubertha Sayres, MD as Referring Physician (Dermatology)    Assessment:    Physical assessment deferred to PCP.  Exercise Activities and Dietary recommendations Current Exercise Habits: Home exercise routine, Type of exercise: walking, Time (Minutes): 30, Frequency (Times/Week): 3, Weekly Exercise (Minutes/Week): 90, Exercise limited by: None identified   Diet (meal preparation, eat out,  water intake, caffeinated beverages, dairy products, fruits and vegetables): Drinks water, decaf coffee, and Pepsi.  Eats 3 meals/day, heart healthy. Rarely snacks.     Goals    . Weight (lb) < 180 lb (81.6 kg)          Lose weight       Fall Risk Fall Risk  05/18/2017 04/21/2016 11/20/2015  Falls in the past year? No No No   Depression Screen PHQ 2/9 Scores 05/18/2017 04/21/2016 11/20/2015  PHQ - 2 Score 0 0 0     Cognitive Function       Ad8 score reviewed for issues:  Issues making decisions: no  Less interest in hobbies / activities: no  Repeats questions, stories (family complaining): no  Trouble using ordinary gadgets (microwave, computer, phone): no  Forgets the month or year: no  Mismanaging finances: no  Remembering appts: no  Daily problems with thinking and/or memory: no Ad8 score is=0     Immunization History  Administered Date(s) Administered  . Hepatitis A 03/09/2011, 09/21/2011  . Hepatitis B 01/05/2011, 03/09/2011, 03/20/2012  . Influenza,inj,Quad PF,6+ Mos 04/21/2016, 04/26/2017  . Influenza-Unspecified 04/09/2013, 04/10/2014, 04/14/2015  . Pneumococcal Conjugate-13 03/09/2015  . Pneumococcal Polysaccharide-23 05/26/2016  . Tdap 11/22/2004, 03/27/2015  . Zoster 06/24/2010   Screening Tests Health Maintenance  Topic Date Due  . Hepatitis C Screening  10/23/2017 (Originally [birth year])  . MAMMOGRAM  10/11/2018  . COLONOSCOPY  07/03/2019  . TETANUS/TDAP  03/26/2025  . INFLUENZA VACCINE  Completed  . DEXA SCAN  Completed  . PNA vac Low Risk Adult  Completed      Plan:     Shingles vaccine at pharmacy.   Bring a copy of your living will and/or healthcare power of attorney to your next office visit.  Continue doing brain stimulating activities (puzzles, reading, adult coloring books, staying active) to keep memory sharp.    I have personally reviewed and noted the following in the patient's chart:   . Medical and social  history . Use of alcohol, tobacco or illicit drugs  . Current medications and supplements . Functional ability and status . Nutritional status . Physical activity . Advanced directives . List of other physicians . Hospitalizations, surgeries, and ER visits in previous 12 months . Vitals . Screenings to include cognitive, depression, and falls . Referrals and appointments  In addition, I have reviewed and discussed with patient certain preventive protocols, quality metrics, and best practice recommendations. A written personalized care plan for preventive services as well as general preventive health recommendations were provided to patient.     Gerilyn Nestle, RN  05/18/2017  Reviewed documentation provided by RN and agree w/ above.  Annye Asa, MD

## 2017-05-18 ENCOUNTER — Encounter: Payer: Self-pay | Admitting: General Practice

## 2017-05-18 ENCOUNTER — Ambulatory Visit: Payer: Medicare Other

## 2017-05-18 ENCOUNTER — Encounter: Payer: Self-pay | Admitting: Family Medicine

## 2017-05-18 ENCOUNTER — Ambulatory Visit (INDEPENDENT_AMBULATORY_CARE_PROVIDER_SITE_OTHER): Payer: Medicare Other | Admitting: Family Medicine

## 2017-05-18 VITALS — BP 138/70 | HR 75 | Temp 98.4°F | Resp 18 | Ht 65.0 in | Wt 192.8 lb

## 2017-05-18 DIAGNOSIS — Z0000 Encounter for general adult medical examination without abnormal findings: Secondary | ICD-10-CM

## 2017-05-18 DIAGNOSIS — E669 Obesity, unspecified: Secondary | ICD-10-CM

## 2017-05-18 DIAGNOSIS — E039 Hypothyroidism, unspecified: Secondary | ICD-10-CM

## 2017-05-18 DIAGNOSIS — Z23 Encounter for immunization: Secondary | ICD-10-CM

## 2017-05-18 DIAGNOSIS — Z6832 Body mass index (BMI) 32.0-32.9, adult: Secondary | ICD-10-CM

## 2017-05-18 LAB — CBC WITH DIFFERENTIAL/PLATELET
BASOS PCT: 1.2 % (ref 0.0–3.0)
Basophils Absolute: 0.1 10*3/uL (ref 0.0–0.1)
EOS PCT: 2.6 % (ref 0.0–5.0)
Eosinophils Absolute: 0.2 10*3/uL (ref 0.0–0.7)
HEMATOCRIT: 41.1 % (ref 36.0–46.0)
HEMOGLOBIN: 13.4 g/dL (ref 12.0–15.0)
LYMPHS PCT: 33.7 % (ref 12.0–46.0)
Lymphs Abs: 2.2 10*3/uL (ref 0.7–4.0)
MCHC: 32.6 g/dL (ref 30.0–36.0)
MCV: 93.3 fl (ref 78.0–100.0)
MONO ABS: 0.6 10*3/uL (ref 0.1–1.0)
Monocytes Relative: 9.4 % (ref 3.0–12.0)
Neutro Abs: 3.4 10*3/uL (ref 1.4–7.7)
Neutrophils Relative %: 53.1 % (ref 43.0–77.0)
Platelets: 297 10*3/uL (ref 150.0–400.0)
RBC: 4.4 Mil/uL (ref 3.87–5.11)
RDW: 14 % (ref 11.5–15.5)
WBC: 6.5 10*3/uL (ref 4.0–10.5)

## 2017-05-18 LAB — BASIC METABOLIC PANEL
BUN: 17 mg/dL (ref 6–23)
CHLORIDE: 106 meq/L (ref 96–112)
CO2: 32 mEq/L (ref 19–32)
CREATININE: 0.83 mg/dL (ref 0.40–1.20)
Calcium: 9.5 mg/dL (ref 8.4–10.5)
GFR: 72.6 mL/min (ref >60.00)
Glucose, Bld: 108 mg/dL — ABNORMAL HIGH (ref 70–99)
Potassium: 4.6 mEq/L (ref 3.5–5.1)
Sodium: 143 mEq/L (ref 135–145)

## 2017-05-18 LAB — TSH: TSH: 0.86 u[IU]/mL (ref 0.35–4.50)

## 2017-05-18 LAB — LIPID PANEL
CHOL/HDL RATIO: 3
Cholesterol: 174 mg/dL (ref 0–200)
HDL: 53.9 mg/dL (ref >39.00)
LDL Cholesterol: 97 mg/dL (ref 0–99)
NONHDL: 120.58
Triglycerides: 120 mg/dL (ref 0.0–149.0)
VLDL: 24 mg/dL (ref 0.0–40.0)

## 2017-05-18 LAB — HEPATIC FUNCTION PANEL
ALT: 11 U/L (ref 0–35)
AST: 16 U/L (ref 0–37)
Albumin: 3.9 g/dL (ref 3.5–5.2)
Alkaline Phosphatase: 48 U/L (ref 39–117)
BILIRUBIN TOTAL: 0.4 mg/dL (ref 0.2–1.2)
Bilirubin, Direct: 0.1 mg/dL (ref 0.0–0.3)
Total Protein: 6.2 g/dL (ref 6.0–8.3)

## 2017-05-18 MED ORDER — PHENTERMINE-TOPIRAMATE ER 3.75-23 MG PO CP24: 1.0000 | ORAL_CAPSULE | Freq: Every day | ORAL | 0 refills | Status: DC

## 2017-05-18 MED ORDER — PHENTERMINE-TOPIRAMATE ER 7.5-46 MG PO CP24: 1.0000 | ORAL_CAPSULE | Freq: Every day | ORAL | 2 refills | Status: DC

## 2017-05-18 MED ORDER — ZOSTER VAC RECOMB ADJUVANTED 50 MCG/0.5ML IM SUSR: 0.5000 mL | Freq: Once | INTRAMUSCULAR | 1 refills | Status: AC

## 2017-05-18 NOTE — Patient Instructions (Addendum)
Follow up in 3 months to recheck weight loss progress and determine if we are going to continue the Qsymia We'll notify you of your lab results and make any changes if needed Start the Qsymia 3.'75mg'$  daily x14 days and then switch to the 7.'5mg'$  daily Continue to work on healthy diet and regular exercise- you can do it! Call with any questions or concerns Hang in there!!!  Shingles vaccine at pharmacy.   Bring a copy of your living will and/or healthcare power of attorney to your next office visit.  Continue doing brain stimulating activities (puzzles, reading, adult coloring books, staying active) to keep memory sharp.   Health Maintenance, Female Adopting a healthy lifestyle and getting preventive care can go a long way to promote health and wellness. Talk with your health care provider about what schedule of regular examinations is right for you. This is a good chance for you to check in with your provider about disease prevention and staying healthy. In between checkups, there are plenty of things you can do on your own. Experts have done a lot of research about which lifestyle changes and preventive measures are most likely to keep you healthy. Ask your health care provider for more information. Weight and diet Eat a healthy diet  Be sure to include plenty of vegetables, fruits, low-fat dairy products, and lean protein.  Do not eat a lot of foods high in solid fats, added sugars, or salt.  Get regular exercise. This is one of the most important things you can do for your health. ? Most adults should exercise for at least 150 minutes each week. The exercise should increase your heart rate and make you sweat (moderate-intensity exercise). ? Most adults should also do strengthening exercises at least twice a week. This is in addition to the moderate-intensity exercise.  Maintain a healthy weight  Body mass index (BMI) is a measurement that can be used to identify possible weight problems.  It estimates body fat based on height and weight. Your health care provider can help determine your BMI and help you achieve or maintain a healthy weight.  For females 68 years of age and older: ? A BMI below 18.5 is considered underweight. ? A BMI of 18.5 to 24.9 is normal. ? A BMI of 25 to 29.9 is considered overweight. ? A BMI of 30 and above is considered obese.  Watch levels of cholesterol and blood lipids  You should start having your blood tested for lipids and cholesterol at 68 years of age, then have this test every 5 years.  You may need to have your cholesterol levels checked more often if: ? Your lipid or cholesterol levels are high. ? You are older than 68 years of age. ? You are at high risk for heart disease.  Cancer screening Lung Cancer  Lung cancer screening is recommended for adults 68-16 years old who are at high risk for lung cancer because of a history of smoking.  A yearly low-dose CT scan of the lungs is recommended for people who: ? Currently smoke. ? Have quit within the past 15 years. ? Have at least a 30-pack-year history of smoking. A pack year is smoking an average of one pack of cigarettes a day for 1 year.  Yearly screening should continue until it has been 15 years since you quit.  Yearly screening should stop if you develop a health problem that would prevent you from having lung cancer treatment.  Breast Cancer  Practice  breast self-awareness. This means understanding how your breasts normally appear and feel.  It also means doing regular breast self-exams. Let your health care provider know about any changes, no matter how small.  If you are in your 20s or 30s, you should have a clinical breast exam (CBE) by a health care provider every 1-3 years as part of a regular health exam.  If you are 11 or older, have a CBE every year. Also consider having a breast X-ray (mammogram) every year.  If you have a family history of breast cancer, talk to  your health care provider about genetic screening.  If you are at high risk for breast cancer, talk to your health care provider about having an MRI and a mammogram every year.  Breast cancer gene (BRCA) assessment is recommended for women who have family members with BRCA-related cancers. BRCA-related cancers include: ? Breast. ? Ovarian. ? Tubal. ? Peritoneal cancers.  Results of the assessment will determine the need for genetic counseling and BRCA1 and BRCA2 testing.  Cervical Cancer Your health care provider may recommend that you be screened regularly for cancer of the pelvic organs (ovaries, uterus, and vagina). This screening involves a pelvic examination, including checking for microscopic changes to the surface of your cervix (Pap test). You may be encouraged to have this screening done every 3 years, beginning at age 68.  For women ages 28-65, health care providers may recommend pelvic exams and Pap testing every 3 years, or they may recommend the Pap and pelvic exam, combined with testing for human papilloma virus (HPV), every 5 years. Some types of HPV increase your risk of cervical cancer. Testing for HPV may also be done on women of 68 years of age with unclear Pap test results.  Other health care providers may not recommend any screening for nonpregnant women who are considered low risk for pelvic cancer and who do not have symptoms. Ask your health care provider if a screening pelvic exam is right for you.  If you have had past treatment for cervical cancer or a condition that could lead to cancer, you need Pap tests and screening for cancer for at least 20 years after your treatment. If Pap tests have been discontinued, your risk factors (such as having a new sexual partner) need to be reassessed to determine if screening should resume. Some women have medical problems that increase the chance of getting cervical cancer. In these cases, your health care provider may recommend more  frequent screening and Pap tests.  Colorectal Cancer  This type of cancer can be detected and often prevented.  Routine colorectal cancer screening usually begins at 68 years of age and continues through 68 years of age.  Your health care provider may recommend screening at an earlier age if you have risk factors for colon cancer.  Your health care provider may also recommend using home test kits to check for hidden blood in the stool.  A small camera at the end of a tube can be used to examine your colon directly (sigmoidoscopy or colonoscopy). This is done to check for the earliest forms of colorectal cancer.  Routine screening usually begins at age 34.  Direct examination of the colon should be repeated every 5-10 years through 68 years of age. However, you may need to be screened more often if early forms of precancerous polyps or small growths are found.  Skin Cancer  Check your skin from head to toe regularly.  Tell your health care provider  about any new moles or changes in moles, especially if there is a change in a mole's shape or color.  Also tell your health care provider if you have a mole that is larger than the size of a pencil eraser.  Always use sunscreen. Apply sunscreen liberally and repeatedly throughout the day.  Protect yourself by wearing long sleeves, pants, a wide-brimmed hat, and sunglasses whenever you are outside.  Heart disease, diabetes, and high blood pressure  High blood pressure causes heart disease and increases the risk of stroke. High blood pressure is more likely to develop in: ? People who have blood pressure in the high end of the normal range (130-139/85-89 mm Hg). ? People who are overweight or obese. ? People who are African American.  If you are 73-49 years of age, have your blood pressure checked every 3-5 years. If you are 41 years of age or older, have your blood pressure checked every year. You should have your blood pressure measured  twice-once when you are at a hospital or clinic, and once when you are not at a hospital or clinic. Record the average of the two measurements. To check your blood pressure when you are not at a hospital or clinic, you can use: ? An automated blood pressure machine at a pharmacy. ? A home blood pressure monitor.  If you are between 54 years and 22 years old, ask your health care provider if you should take aspirin to prevent strokes.  Have regular diabetes screenings. This involves taking a blood sample to check your fasting blood sugar level. ? If you are at a normal weight and have a low risk for diabetes, have this test once every three years after 68 years of age. ? If you are overweight and have a high risk for diabetes, consider being tested at a younger age or more often. Preventing infection Hepatitis B  If you have a higher risk for hepatitis B, you should be screened for this virus. You are considered at high risk for hepatitis B if: ? You were born in a country where hepatitis B is common. Ask your health care provider which countries are considered high risk. ? Your parents were born in a high-risk country, and you have not been immunized against hepatitis B (hepatitis B vaccine). ? You have HIV or AIDS. ? You use needles to inject street drugs. ? You live with someone who has hepatitis B. ? You have had sex with someone who has hepatitis B. ? You get hemodialysis treatment. ? You take certain medicines for conditions, including cancer, organ transplantation, and autoimmune conditions.  Hepatitis C  Blood testing is recommended for: ? Everyone born from [birth year] through 1967. ? Anyone with known risk factors for hepatitis C.  Sexually transmitted infections (STIs)  You should be screened for sexually transmitted infections (STIs) including gonorrhea and chlamydia if: ? You are sexually active and are younger than 68 years of age. ? You are older than 68 years of age and your  health care provider tells you that you are at risk for this type of infection. ? Your sexual activity has changed since you were last screened and you are at an increased risk for chlamydia or gonorrhea. Ask your health care provider if you are at risk.  If you do not have HIV, but are at risk, it may be recommended that you take a prescription medicine daily to prevent HIV infection. This is called pre-exposure prophylaxis (PrEP). You are  considered at risk if: ? You are sexually active and do not regularly use condoms or know the HIV status of your partner(s). ? You take drugs by injection. ? You are sexually active with a partner who has HIV.  Talk with your health care provider about whether you are at high risk of being infected with HIV. If you choose to begin PrEP, you should first be tested for HIV. You should then be tested every 3 months for as long as you are taking PrEP. Pregnancy  If you are premenopausal and you may become pregnant, ask your health care provider about preconception counseling.  If you may become pregnant, take 400 to 800 micrograms (mcg) of folic acid every day.  If you want to prevent pregnancy, talk to your health care provider about birth control (contraception). Osteoporosis and menopause  Osteoporosis is a disease in which the bones lose minerals and strength with aging. This can result in serious bone fractures. Your risk for osteoporosis can be identified using a bone density scan.  If you are 79 years of age or older, or if you are at risk for osteoporosis and fractures, ask your health care provider if you should be screened.  Ask your health care provider whether you should take a calcium or vitamin D supplement to lower your risk for osteoporosis.  Menopause may have certain physical symptoms and risks.  Hormone replacement therapy may reduce some of these symptoms and risks. Talk to your health care provider about whether hormone replacement  therapy is right for you. Follow these instructions at home:  Schedule regular health, dental, and eye exams.  Stay current with your immunizations.  Do not use any tobacco products including cigarettes, chewing tobacco, or electronic cigarettes.  If you are pregnant, do not drink alcohol.  If you are breastfeeding, limit how much and how often you drink alcohol.  Limit alcohol intake to no more than 1 drink per day for nonpregnant women. One drink equals 12 ounces of beer, 5 ounces of wine, or 1 ounces of hard liquor.  Do not use street drugs.  Do not share needles.  Ask your health care provider for help if you need support or information about quitting drugs.  Tell your health care provider if you often feel depressed.  Tell your health care provider if you have ever been abused or do not feel safe at home. This information is not intended to replace advice given to you by your health care provider. Make sure you discuss any questions you have with your health care provider. Document Released: 01/24/2011 Document Revised: 12/17/2015 Document Reviewed: 04/14/2015 Elsevier Interactive Patient Education  [employer].

## 2017-05-18 NOTE — Assessment & Plan Note (Signed)
Ongoing issue for pt.  She is very frustrated w/ lack of weight loss progress despite exercise and improved diet.  Sister (age 68) is on Qsymia and she wants to start med to see if it works.  Stressed need for continued efforts at diet and exercise.  Will follow closely.

## 2017-05-18 NOTE — Progress Notes (Signed)
   Subjective:    Patient ID: Karen Jones, female    DOB: February 23, 1951, 66 y.o.   MRN: [id number]  HPI Hypothyroid- chronic problem, on Levothyroxine 24mcg daily.  Denies changes to skin/hair/nails, bladder or bowel issues.  Obesity- ongoing issue for pt.  BMI is 32.08.  Pt is interested in weight loss.  Pt is walking 30 minutes 3x/week.  Reports she has decreased her food intake.  Pt's sister (age 57) is taking Qsymia w/ good results.  No CP, SOB, HAs, visual changes, edema.   Review of Systems For ROS see HPI     Objective:   Physical Exam  Constitutional: She is oriented to person, place, and time. She appears well-developed and well-nourished. No distress.  HENT:  Head: Normocephalic and atraumatic.  Eyes: Pupils are equal, round, and reactive to light. Conjunctivae and EOM are normal.  Neck: Normal range of motion. Neck supple. No thyromegaly present.  Cardiovascular: Normal rate, regular rhythm, normal heart sounds and intact distal pulses.   No murmur heard. Pulmonary/Chest: Effort normal and breath sounds normal. No respiratory distress.  Abdominal: Soft. She exhibits no distension. There is no tenderness.  Musculoskeletal: She exhibits no edema.  Lymphadenopathy:    She has no cervical adenopathy.  Neurological: She is alert and oriented to person, place, and time.  Skin: Skin is warm and dry.  Psychiatric: She has a normal mood and affect. Her behavior is normal.  Vitals reviewed.         Assessment & Plan:

## 2017-05-18 NOTE — Assessment & Plan Note (Signed)
Chronic problem.  Asymptomatic at this time.  Check labs.  Adjust meds prn. 

## 2017-05-22 ENCOUNTER — Encounter: Payer: Self-pay | Admitting: Family Medicine

## 2017-05-22 MED ORDER — MECLIZINE HCL 25 MG PO TABS: 25.0000 mg | ORAL_TABLET | Freq: Three times a day (TID) | ORAL | 1 refills | Status: DC | PRN

## 2017-05-24 ENCOUNTER — Telehealth: Payer: Self-pay | Admitting: *Deleted

## 2017-05-24 NOTE — Telephone Encounter (Signed)
Attempted to call patient to discuss medication/insurance.    Qsymia has been called in, however, medicare will not cover any medications for weight loss.   If patient wishes to use medication, she will have to pay out of pocket.    I called patient today on the number we have listed, there was no answer and no VM. I will inform PCP that medication is not covered just so she is aware.

## 2017-05-26 NOTE — Telephone Encounter (Signed)
Pt's husband is on DPR, and I have discussed this medication issue below with him.   Patient was going to the pharmacy today for an update, so he was going to call her to let her know what is going on.   They are aware if we need to send RX to a different pharmacy since they have to pay out of pocket they will need to call and let us know.

## 2017-06-01 ENCOUNTER — Other Ambulatory Visit: Payer: Self-pay | Admitting: Family Medicine

## 2017-06-05 ENCOUNTER — Encounter: Payer: Self-pay | Admitting: Family Medicine

## 2017-06-05 ENCOUNTER — Other Ambulatory Visit: Payer: Self-pay | Admitting: Family Medicine

## 2017-06-21 DIAGNOSIS — H2513 Age-related nuclear cataract, bilateral: Secondary | ICD-10-CM | POA: Diagnosis not present

## 2017-08-28 ENCOUNTER — Other Ambulatory Visit: Payer: Self-pay | Admitting: Family Medicine

## 2017-08-30 ENCOUNTER — Other Ambulatory Visit: Payer: Self-pay | Admitting: Family Medicine

## 2017-08-30 ENCOUNTER — Encounter: Payer: Self-pay | Admitting: Family Medicine

## 2017-08-30 DIAGNOSIS — N644 Mastodynia: Secondary | ICD-10-CM

## 2017-08-31 ENCOUNTER — Other Ambulatory Visit: Payer: Self-pay | Admitting: Family Medicine

## 2017-08-31 DIAGNOSIS — N644 Mastodynia: Secondary | ICD-10-CM

## 2017-09-11 ENCOUNTER — Encounter: Payer: Self-pay | Admitting: Physician Assistant

## 2017-09-11 ENCOUNTER — Other Ambulatory Visit: Payer: Self-pay

## 2017-09-11 ENCOUNTER — Ambulatory Visit (INDEPENDENT_AMBULATORY_CARE_PROVIDER_SITE_OTHER): Payer: Medicare Other | Admitting: Physician Assistant

## 2017-09-11 VITALS — BP 110/60 | HR 78 | Temp 98.8°F | Resp 16 | Ht 65.0 in | Wt 191.0 lb

## 2017-09-11 DIAGNOSIS — J019 Acute sinusitis, unspecified: Secondary | ICD-10-CM

## 2017-09-11 DIAGNOSIS — B9689 Other specified bacterial agents as the cause of diseases classified elsewhere: Secondary | ICD-10-CM | POA: Diagnosis not present

## 2017-09-11 MED ORDER — HYDROCOD POLST-CPM POLST ER 10-8 MG/5ML PO SUER: 5.0000 mL | Freq: Two times a day (BID) | ORAL | 0 refills | Status: DC | PRN

## 2017-09-11 MED ORDER — DOXYCYCLINE HYCLATE 100 MG PO CAPS: 100.0000 mg | ORAL_CAPSULE | Freq: Two times a day (BID) | ORAL | 0 refills | Status: DC

## 2017-09-11 NOTE — Patient Instructions (Signed)
Please take antibiotic as directed.  Increase fluid intake.  Use Saline nasal spray.  Take a daily multivitamin. Use the Tussionex as directed for cough..  Place a humidifier in the bedroom.  Please call or return clinic if symptoms are not improving.  Sinusitis Sinusitis is redness, soreness, and swelling (inflammation) of the paranasal sinuses. Paranasal sinuses are air pockets within the bones of your face (beneath the eyes, the middle of the forehead, or above the eyes). In healthy paranasal sinuses, mucus is able to drain out, and air is able to circulate through them by way of your nose. However, when your paranasal sinuses are inflamed, mucus and air can become trapped. This can allow bacteria and other germs to grow and cause infection. Sinusitis can develop quickly and last only a short time (acute) or continue over a long period (chronic). Sinusitis that lasts for more than 12 weeks is considered chronic.  CAUSES  Causes of sinusitis include:  Allergies.  Structural abnormalities, such as displacement of the cartilage that separates your nostrils (deviated septum), which can decrease the air flow through your nose and sinuses and affect sinus drainage.  Functional abnormalities, such as when the small hairs (cilia) that line your sinuses and help remove mucus do not work properly or are not present. SYMPTOMS  Symptoms of acute and chronic sinusitis are the same. The primary symptoms are pain and pressure around the affected sinuses. Other symptoms include:  Upper toothache.  Earache.  Headache.  Bad breath.  Decreased sense of smell and taste.  A cough, which worsens when you are lying flat.  Fatigue.  Fever.  Thick drainage from your nose, which often is green and may contain pus (purulent).  Swelling and warmth over the affected sinuses. DIAGNOSIS  Your caregiver will perform a physical exam. During the exam, your caregiver may:  Look in your nose for signs of  abnormal growths in your nostrils (nasal polyps).  Tap over the affected sinus to check for signs of infection.  View the inside of your sinuses (endoscopy) with a special imaging device with a light attached (endoscope), which is inserted into your sinuses. If your caregiver suspects that you have chronic sinusitis, one or more of the following tests may be recommended:  Allergy tests.  Nasal culture A sample of mucus is taken from your nose and sent to a lab and screened for bacteria.  Nasal cytology A sample of mucus is taken from your nose and examined by your caregiver to determine if your sinusitis is related to an allergy. TREATMENT  Most cases of acute sinusitis are related to a viral infection and will resolve on their own within 10 days. Sometimes medicines are prescribed to help relieve symptoms (pain medicine, decongestants, nasal steroid sprays, or saline sprays).  However, for sinusitis related to a bacterial infection, your caregiver will prescribe antibiotic medicines. These are medicines that will help kill the bacteria causing the infection.  Rarely, sinusitis is caused by a fungal infection. In theses cases, your caregiver will prescribe antifungal medicine. For some cases of chronic sinusitis, surgery is needed. Generally, these are cases in which sinusitis recurs more than 3 times per year, despite other treatments. HOME CARE INSTRUCTIONS   Drink plenty of water. Water helps thin the mucus so your sinuses can drain more easily.  Use a humidifier.  Inhale steam 3 to 4 times a day (for example, sit in the bathroom with the shower running).  Apply a warm, moist washcloth to your  face 3 to 4 times a day, or as directed by your caregiver.  Use saline nasal sprays to help moisten and clean your sinuses.  Take over-the-counter or prescription medicines for pain, discomfort, or fever only as directed by your caregiver. SEEK IMMEDIATE MEDICAL CARE IF:  You have increasing  pain or severe headaches.  You have nausea, vomiting, or drowsiness.  You have swelling around your face.  You have vision problems.  You have a stiff neck.  You have difficulty breathing. MAKE SURE YOU:   Understand these instructions.  Will watch your condition.  Will get help right away if you are not doing well or get worse. Document Released: 07/11/2005 Document Revised: 10/03/2011 Document Reviewed: 07/26/2011 [facility] Patient Information 2014 [place], [place].

## 2017-09-11 NOTE — Progress Notes (Signed)
Patient presents to clinic today c/o 2 weeks of sinus pressure, sinus pain and PND. Now with 2 days of worsening symptoms including tooth pain and low-grade fever. Notes sore throat. Dnines chest pain. Cough is mild and dry but worse at night and keeping her up at night. Has taken Tylenol cold and Claritin for symptoms with only some relief. Denies recent travel. .  Past Medical History:  Diagnosis Date  . Anemia   . Benign positional vertigo   . Cancer ([place])    skin  . Colon polyps   . Pain in limb   . Thyroid disease     Current Outpatient Medications on File Prior to Visit  Medication Sig Dispense Refill  . levothyroxine (SYNTHROID, LEVOTHROID) 75 MCG tablet TAKE 1 TABLET BY MOUTH  DAILY BEFORE BREAKFAST 90 tablet 1  . loratadine (CLARITIN) 10 MG tablet Take 10 mg by mouth daily.    . meclizine (ANTIVERT) 25 MG tablet Take 1 tablet (25 mg total) by mouth 3 (three) times daily as needed for dizziness. 30 tablet 1  . MYRBETRIQ 50 MG TB24 tablet TAKE 1 TABLET BY MOUTH  DAILY 90 tablet 1   No current facility-administered medications on file prior to visit.     Allergies  Allergen Reactions  . Penicillins Shortness Of Breath and Rash    REACTION: rash, difficulty breathing  . Levofloxacin     Other reaction(s): Myalgias (intolerance)    Family History  Problem Relation Age of Onset  . Emphysema Mother   . Colon cancer Father 60  . Cancer Father   . Heart disease Father   . Heart attack Father   . Breast cancer Neg Hx     Social History   Socioeconomic History  . Marital status: Married    Spouse name: None  . Number of children: None  . Years of education: None  . Highest education level: None  Social Needs  . Financial resource strain: None  . Food insecurity - worry: None  . Food insecurity - inability: None  . Transportation needs - medical: None  . Transportation needs - non-medical: None  Occupational History  . None  Tobacco Use  . Smoking status:  Never Smoker  . Smokeless tobacco: Never Used  Substance and Sexual Activity  . Alcohol use: Yes    Alcohol/week: 3.0 oz    Types: 3 Glasses of wine, 2 Shots of liquor per week  . Drug use: No  . Sexual activity: None  Other Topics Concern  . None  Social History Narrative  . None    Review of Systems - See HPI.  All other ROS are negative.  BP 110/60   Pulse 78   Temp 98.8 F (37.1 C) (Oral)   Resp 16   Ht 5\' 5"  (1.651 m)   Wt 191 lb (86.6 kg)   SpO2 95%   BMI 31.78 kg/m   Physical Exam  Constitutional: She is oriented to person, place, and time and well-developed, well-nourished, and in no distress.  HENT:  Head: Normocephalic and atraumatic.  Right Ear: Tympanic membrane and external ear normal.  Left Ear: Tympanic membrane and external ear normal.  Nose: Mucosal edema and rhinorrhea present. Right sinus exhibits maxillary sinus tenderness. Left sinus exhibits maxillary sinus tenderness and frontal sinus tenderness.  Mouth/Throat: Uvula is midline, oropharynx is clear and moist and mucous membranes are normal.  Eyes: Conjunctivae are normal.  Neck: Neck supple.  Cardiovascular: Normal rate, regular rhythm, normal  heart sounds and intact distal pulses.  Pulmonary/Chest: Effort normal and breath sounds normal. No respiratory distress. She has no wheezes. She has no rales. She exhibits no tenderness.  Neurological: She is alert and oriented to person, place, and time.  Skin: Skin is warm and dry. No rash noted.  Psychiatric: Affect normal.  Vitals reviewed.  Assessment/Plan: 1. Acute bacterial sinusitis Rx Doxycycline.  Increase fluids.  Rest.  Saline nasal spray.  Probiotic.  Mucinex as directed.  Humidifier in bedroom. Tussionex for cough.  Call or return to clinic if symptoms are not improving.  - doxycycline (VIBRAMYCIN) 100 MG capsule; Take 1 capsule (100 mg total) by mouth 2 (two) times daily.  Dispense: 20 capsule; Refill: 0 - chlorpheniramine-HYDROcodone  (TUSSIONEX PENNKINETIC ER) 10-8 MG/5ML SUER; Take 5 mLs by mouth every 12 (twelve) hours as needed.  Dispense: 140 mL; Refill: 0   Leeanne Rio, PA-C

## 2017-10-11 ENCOUNTER — Ambulatory Visit: Payer: Medicare Other

## 2017-10-11 ENCOUNTER — Ambulatory Visit
Admission: RE | Admit: 2017-10-11 | Discharge: 2017-10-11 | Disposition: A | Discharge status: Home / Self Care | Payer: Medicare Other | Source: Ambulatory Visit | Attending: Family Medicine | Admitting: Family Medicine

## 2017-10-11 DIAGNOSIS — R928 Other abnormal and inconclusive findings on diagnostic imaging of breast: Secondary | ICD-10-CM | POA: Diagnosis not present

## 2017-10-11 DIAGNOSIS — N644 Mastodynia: Secondary | ICD-10-CM

## 2017-11-24 ENCOUNTER — Other Ambulatory Visit: Payer: Self-pay | Admitting: Emergency Medicine

## 2017-11-24 DIAGNOSIS — Z23 Encounter for immunization: Secondary | ICD-10-CM

## 2017-11-24 MED ORDER — ZOSTER VAC RECOMB ADJUVANTED 50 MCG/0.5ML IM SUSR: 0.5000 mL | Freq: Once | INTRAMUSCULAR | 0 refills | Status: AC

## 2017-12-20 ENCOUNTER — Encounter: Payer: Self-pay | Admitting: Physician Assistant

## 2017-12-20 ENCOUNTER — Ambulatory Visit (INDEPENDENT_AMBULATORY_CARE_PROVIDER_SITE_OTHER): Payer: Medicare Other | Admitting: Physician Assistant

## 2017-12-20 ENCOUNTER — Other Ambulatory Visit: Payer: Self-pay

## 2017-12-20 DIAGNOSIS — J019 Acute sinusitis, unspecified: Secondary | ICD-10-CM | POA: Diagnosis not present

## 2017-12-20 DIAGNOSIS — B9689 Other specified bacterial agents as the cause of diseases classified elsewhere: Secondary | ICD-10-CM

## 2017-12-20 MED ORDER — HYDROCOD POLST-CPM POLST ER 10-8 MG/5ML PO SUER: 5.0000 mL | Freq: Two times a day (BID) | ORAL | 0 refills | Status: DC | PRN

## 2017-12-20 MED ORDER — DOXYCYCLINE HYCLATE 100 MG PO CAPS: 100.0000 mg | ORAL_CAPSULE | Freq: Two times a day (BID) | ORAL | 0 refills | Status: DC

## 2017-12-20 NOTE — Patient Instructions (Signed)
Please take antibiotic as directed.  Increase fluid intake.  Use Saline nasal spray.  Take a daily multivitamin. Use cough syrup as directed.  Place a humidifier in the bedroom.  Please call or return clinic if symptoms are not improving.  You can go ahead and schedule your annual wellness exam with Maudie Mercury and Dr. Birdie Riddle for on or after 05/19/2018.  Sinusitis Sinusitis is redness, soreness, and swelling (inflammation) of the paranasal sinuses. Paranasal sinuses are air pockets within the bones of your face (beneath the eyes, the middle of the forehead, or above the eyes). In healthy paranasal sinuses, mucus is able to drain out, and air is able to circulate through them by way of your nose. However, when your paranasal sinuses are inflamed, mucus and air can become trapped. This can allow bacteria and other germs to grow and cause infection. Sinusitis can develop quickly and last only a short time (acute) or continue over a long period (chronic). Sinusitis that lasts for more than 12 weeks is considered chronic.  CAUSES  Causes of sinusitis include:  Allergies.  Structural abnormalities, such as displacement of the cartilage that separates your nostrils (deviated septum), which can decrease the air flow through your nose and sinuses and affect sinus drainage.  Functional abnormalities, such as when the small hairs (cilia) that line your sinuses and help remove mucus do not work properly or are not present. SYMPTOMS  Symptoms of acute and chronic sinusitis are the same. The primary symptoms are pain and pressure around the affected sinuses. Other symptoms include:  Upper toothache.  Earache.  Headache.  Bad breath.  Decreased sense of smell and taste.  A cough, which worsens when you are lying flat.  Fatigue.  Fever.  Thick drainage from your nose, which often is green and may contain pus (purulent).  Swelling and warmth over the affected sinuses. DIAGNOSIS  Your caregiver will  perform a physical exam. During the exam, your caregiver may:  Look in your nose for signs of abnormal growths in your nostrils (nasal polyps).  Tap over the affected sinus to check for signs of infection.  View the inside of your sinuses (endoscopy) with a special imaging device with a light attached (endoscope), which is inserted into your sinuses. If your caregiver suspects that you have chronic sinusitis, one or more of the following tests may be recommended:  Allergy tests.  Nasal culture A sample of mucus is taken from your nose and sent to a lab and screened for bacteria.  Nasal cytology A sample of mucus is taken from your nose and examined by your caregiver to determine if your sinusitis is related to an allergy. TREATMENT  Most cases of acute sinusitis are related to a viral infection and will resolve on their own within 10 days. Sometimes medicines are prescribed to help relieve symptoms (pain medicine, decongestants, nasal steroid sprays, or saline sprays).  However, for sinusitis related to a bacterial infection, your caregiver will prescribe antibiotic medicines. These are medicines that will help kill the bacteria causing the infection.  Rarely, sinusitis is caused by a fungal infection. In theses cases, your caregiver will prescribe antifungal medicine. For some cases of chronic sinusitis, surgery is needed. Generally, these are cases in which sinusitis recurs more than 3 times per year, despite other treatments. HOME CARE INSTRUCTIONS   Drink plenty of water. Water helps thin the mucus so your sinuses can drain more easily.  Use a humidifier.  Inhale steam 3 to 4 times a  day (for example, sit in the bathroom with the shower running).  Apply a warm, moist washcloth to your face 3 to 4 times a day, or as directed by your caregiver.  Use saline nasal sprays to help moisten and clean your sinuses.  Take over-the-counter or prescription medicines for pain, discomfort, or  fever only as directed by your caregiver. SEEK IMMEDIATE MEDICAL CARE IF:  You have increasing pain or severe headaches.  You have nausea, vomiting, or drowsiness.  You have swelling around your face.  You have vision problems.  You have a stiff neck.  You have difficulty breathing. MAKE SURE YOU:   Understand these instructions.  Will watch your condition.  Will get help right away if you are not doing well or get worse. Document Released: 07/11/2005 Document Revised: 10/03/2011 Document Reviewed: 07/26/2011 [facility] Patient Information 2014 [place], [place].

## 2017-12-20 NOTE — Progress Notes (Signed)
Patient presents to clinic today c/o > 2 weeks of nasal congestion, sinus pressure, sinus pain (maxillary pain), sinus headache and ear pressure. (Patient has a tube in the R ear currently). Denies fevers, chills. Denies chest congestion but notes a dry cough, mainly at night. Recently took a cruise to the [employer] [place]. Has been taking OTC medications with minimal relief.   Past Medical History:  Diagnosis Date  . Anemia   . Benign positional vertigo   . Cancer ([place])    skin  . Colon polyps   . Pain in limb   . Thyroid disease     Current Outpatient Medications on File Prior to Visit  Medication Sig Dispense Refill  . levothyroxine (SYNTHROID, LEVOTHROID) 75 MCG tablet TAKE 1 TABLET BY MOUTH  DAILY BEFORE BREAKFAST 90 tablet 1  . loratadine (CLARITIN) 10 MG tablet Take 10 mg by mouth daily.    . meclizine (ANTIVERT) 25 MG tablet Take 1 tablet (25 mg total) by mouth 3 (three) times daily as needed for dizziness. 30 tablet 1  . MYRBETRIQ 50 MG TB24 tablet TAKE 1 TABLET BY MOUTH  DAILY 90 tablet 1  . Pseudoephedrine-APAP-DM (TYLENOL COLD/FLU SEVERE DAY PO) Take by mouth.     No current facility-administered medications on file prior to visit.     Allergies  Allergen Reactions  . Penicillins Shortness Of Breath and Rash    REACTION: rash, difficulty breathing  . Levofloxacin     Other reaction(s): Myalgias (intolerance)    Family History  Problem Relation Age of Onset  . Emphysema Mother   . Colon cancer Father 78  . Cancer Father   . Heart disease Father   . Heart attack Father   . Breast cancer Neg Hx     Social History   Socioeconomic History  . Marital status: Married    Spouse name: Not on file  . Number of children: Not on file  . Years of education: Not on file  . Highest education level: Not on file  Occupational History  . Not on file  Social Needs  . Financial resource strain: Not on file  . Food insecurity:    Worry: Not on file    Inability: Not  on file  . Transportation needs:    Medical: Not on file    Non-medical: Not on file  Tobacco Use  . Smoking status: Never Smoker  . Smokeless tobacco: Never Used  Substance and Sexual Activity  . Alcohol use: Yes    Alcohol/week: 3.0 oz    Types: 3 Glasses of wine, 2 Shots of liquor per week  . Drug use: No  . Sexual activity: Not on file  Lifestyle  . Physical activity:    Days per week: Not on file    Minutes per session: Not on file  . Stress: Not on file  Relationships  . Social connections:    Talks on phone: Not on file    Gets together: Not on file    Attends religious service: Not on file    Active member of club or organization: Not on file    Attends meetings of clubs or organizations: Not on file    Relationship status: Not on file  Other Topics Concern  . Not on file  Social History Narrative  . Not on file   Review of Systems - See HPI.  All other ROS are negative.  BP 124/80   Pulse 68   Temp 98.4  F (36.9 C) (Oral)   Resp 16   Ht 5\' 5"  (1.651 m)   Wt 195 lb 6.4 oz (88.6 kg)   SpO2 97%   BMI 32.52 kg/m   Physical Exam  Constitutional: She appears well-developed and well-nourished.  HENT:  Head: Normocephalic and atraumatic.  Nose: Mucosal edema and rhinorrhea present. Right sinus exhibits maxillary sinus tenderness. Left sinus exhibits maxillary sinus tenderness.  Mouth/Throat: Uvula is midline, oropharynx is clear and moist and mucous membranes are normal.  R TM with tube in place. No evidence of blockage.  L TM with mild retraction. No erythema or fluid noted.  Eyes: Conjunctivae are normal.  Cardiovascular: Normal rate, regular rhythm and normal heart sounds.  Pulmonary/Chest: Effort normal and breath sounds normal. No stridor. No respiratory distress. She has no wheezes. She has no rales. She exhibits no tenderness.  Vitals reviewed.  Assessment/Plan: 1. Acute bacterial sinusitis Rx Doxycycline.  Increase fluids.  Rest.  Saline nasal  spray.  Probiotic.  Mucinex as directed.  Humidifier in bedroom. Rx Tussionex.  Call or return to clinic if symptoms are not improving.  - doxycycline (VIBRAMYCIN) 100 MG capsule; Take 1 capsule (100 mg total) by mouth 2 (two) times daily.  Dispense: 20 capsule; Refill: 0 - chlorpheniramine-HYDROcodone (TUSSIONEX PENNKINETIC ER) 10-8 MG/5ML SUER; Take 5 mLs by mouth every 12 (twelve) hours as needed.  Dispense: 140 mL; Refill: 0   Leeanne Rio, PA-C

## 2018-03-20 DIAGNOSIS — I83893 Varicose veins of bilateral lower extremities with other complications: Secondary | ICD-10-CM | POA: Diagnosis not present

## 2018-05-07 ENCOUNTER — Ambulatory Visit: Payer: Medicare Other | Admitting: Family Medicine

## 2018-05-07 ENCOUNTER — Telehealth: Payer: Self-pay | Admitting: *Deleted

## 2018-05-07 DIAGNOSIS — M25531 Pain in right wrist: Secondary | ICD-10-CM | POA: Diagnosis not present

## 2018-05-07 DIAGNOSIS — M25521 Pain in right elbow: Secondary | ICD-10-CM | POA: Diagnosis not present

## 2018-05-07 DIAGNOSIS — S52121A Displaced fracture of head of right radius, initial encounter for closed fracture: Secondary | ICD-10-CM | POA: Diagnosis not present

## 2018-05-07 NOTE — Telephone Encounter (Signed)
Patient was added to Dr. Virgil Benedict schedule at 9:45 for broken arm.    I contacted patient to see if this was arm pain or injury.  Patient states that she was out of town last week and she fell and feels like she has broken her arm in two places.    She states that she was advised when she called that we could do everything here, including a cast.   I discussed with patient that we do not do x-rays here, and that we cannot cast.  She stated that she understood and she wasn't sure that we did.  She is going to contact her Ortho office and see if they can see her.  If they cannot get her in, she is going to try an Urgent Care to see if they can help her out.  Advised patient that if she had any trouble to let me know and I would see what we could do. Also advised that [facility] has an Ortho Urgent Care that opens at 5:30 and no appointment is needed.  She appreciated the help and said she will call me back if she has any issues.   The appointment for 9:45 has been canceled and PCP has been notified.    Management has also been notified to pull the call.

## 2018-05-11 DIAGNOSIS — H9201 Otalgia, right ear: Secondary | ICD-10-CM | POA: Diagnosis not present

## 2018-05-11 DIAGNOSIS — Z9622 Myringotomy tube(s) status: Secondary | ICD-10-CM | POA: Diagnosis not present

## 2018-05-11 DIAGNOSIS — H6981 Other specified disorders of Eustachian tube, right ear: Secondary | ICD-10-CM | POA: Diagnosis not present

## 2018-05-16 DIAGNOSIS — I8311 Varicose veins of right lower extremity with inflammation: Secondary | ICD-10-CM | POA: Diagnosis not present

## 2018-05-16 DIAGNOSIS — I8312 Varicose veins of left lower extremity with inflammation: Secondary | ICD-10-CM | POA: Diagnosis not present

## 2018-05-22 DIAGNOSIS — M25531 Pain in right wrist: Secondary | ICD-10-CM | POA: Diagnosis not present

## 2018-05-22 DIAGNOSIS — S63501D Unspecified sprain of right wrist, subsequent encounter: Secondary | ICD-10-CM | POA: Diagnosis not present

## 2018-05-22 DIAGNOSIS — M25511 Pain in right shoulder: Secondary | ICD-10-CM | POA: Diagnosis not present

## 2018-05-22 DIAGNOSIS — M25521 Pain in right elbow: Secondary | ICD-10-CM | POA: Diagnosis not present

## 2018-05-22 NOTE — Progress Notes (Addendum)
Subjective:   Karen Jones is a 67 y.o. female who presents for Medicare Annual (Subsequent) preventive examination.  Review of Systems:  No ROS.  Medicare Wellness Visit. Additional risk factors are reflected in the social history.  Cardiac Risk Factors include: advanced age (>78mn, >>45women);obesity (BMI >30kg/m2);sedentary lifestyle   Sleep patterns: Sleeps 8 hours.  Home Safety/Smoke Alarms: Feels safe in home. Smoke alarms in place.  Living environment; residence and Firearm Safety: Lives with husband and son in 1 story home.  Seat Belt Safety/Bike Helmet: Wears seat belt.   Female:   [id number]      Mammo-10/11/2017, BI-RADS CATEGORY  1: Negative.      Dexa scan-05/10/2016, Osteopenia. Ordered today.       CCS-Colonoscopy 07/02/2014, normal. Recall 5 years.      Objective:     Vitals: BP (!) 148/70 (BP Location: Left Arm, Patient Position: Sitting, Cuff Size: Normal)   Pulse 72   Temp 97.8 F (36.6 C) (Temporal)   Resp 16   Ht 5' 5"  (1.651 m)   Wt 196 lb (88.9 kg)   SpO2 97%   BMI 32.62 kg/m   Body mass index is 32.62 kg/m.  Advanced Directives 05/23/2018 05/18/2017  Does Patient Have a Medical Advance Directive? Yes Yes  Type of Advance Directive Living will Living will    Tobacco Social History   Tobacco Use  Smoking Status Never Smoker  Smokeless Tobacco Never Used     Counseling given: Not Answered    Past Medical History:  Diagnosis Date  . Anemia   . Benign positional vertigo   . Cancer ([place]    skin  . Colon polyps   . Pain in limb   . Thyroid disease    Past Surgical History:  Procedure Laterality Date  . ABDOMINAL HYSTERECTOMY    . BILATERAL OOPHORECTOMY  1995  . COLONOSCOPY  1995,1997,2001,2005,2010   polyps removed every colonoscopy except 1 time,per pt  . INGUINAL HERNIA REPAIR    . POLYPECTOMY    . TONSILLECTOMY    . TUBAL LIGATION     Family History  Problem Relation Age of Onset  . Emphysema Mother   . Colon cancer  Father 785 . Cancer Father   . Heart disease Father   . Heart attack Father   . Breast cancer Neg Hx    Social History   Socioeconomic History  . Marital status: Married    Spouse name: Not on file  . Number of children: Not on file  . Years of education: Not on file  . Highest education level: Not on file  Occupational History  . Not on file  Social Needs  . Financial resource strain: Not on file  . Food insecurity:    Worry: Not on file    Inability: Not on file  . Transportation needs:    Medical: Not on file    Non-medical: Not on file  Tobacco Use  . Smoking status: Never Smoker  . Smokeless tobacco: Never Used  Substance and Sexual Activity  . Alcohol use: Yes    Alcohol/week: 5.0 standard drinks    Types: 3 Glasses of wine, 2 Shots of liquor per week  . Drug use: No  . Sexual activity: Not on file  Lifestyle  . Physical activity:    Days per week: Not on file    Minutes per session: Not on file  . Stress: Not on file  Relationships  . Social  connections:    Talks on phone: Not on file    Gets together: Not on file    Attends religious service: Not on file    Active member of club or organization: Not on file    Attends meetings of clubs or organizations: Not on file    Relationship status: Not on file  Other Topics Concern  . Not on file  Social History Narrative  . Not on file    Outpatient Encounter Medications as of 05/23/2018  Medication Sig  . ibuprofen (ADVIL,MOTRIN) 600 MG tablet   . levothyroxine (SYNTHROID, LEVOTHROID) 75 MCG tablet TAKE 1 TABLET BY MOUTH  DAILY BEFORE BREAKFAST  . loratadine (CLARITIN) 10 MG tablet Take 10 mg by mouth daily.  . meclizine (ANTIVERT) 25 MG tablet Take 1 tablet (25 mg total) by mouth 3 (three) times daily as needed for dizziness.  Marland Kitchen MYRBETRIQ 50 MG TB24 tablet TAKE 1 TABLET BY MOUTH  DAILY  . chlorpheniramine-HYDROcodone (TUSSIONEX PENNKINETIC ER) 10-8 MG/5ML SUER Take 5 mLs by mouth every 12 (twelve) hours as  needed. (Patient not taking: Reported on 05/23/2018)  . Pseudoephedrine-APAP-DM (TYLENOL COLD/FLU SEVERE DAY PO) Take by mouth.  . [DISCONTINUED] doxycycline (VIBRAMYCIN) 100 MG capsule Take 1 capsule (100 mg total) by mouth 2 (two) times daily.  . [DISCONTINUED] Zoster Vaccine Adjuvanted [facility]) injection Shingrix (PF) 50 mcg/0.5 mL intramuscular suspension, kit   No facility-administered encounter medications on file as of 05/23/2018.     Activities of Daily Living In your present state of health, do you have any difficulty performing the following activities: 05/23/2018  Hearing? N  Vision? N  Difficulty concentrating or making decisions? N  Walking or climbing stairs? N  Dressing or bathing? N  Doing errands, shopping? N  Preparing Food and eating ? N  Using the Toilet? N  In the past six months, have you accidently leaked urine? N  Do you have problems with loss of bowel control? N  Managing your Medications? N  Managing your Finances? N  Housekeeping or managing your Housekeeping? N  Some recent data might be hidden    Patient Care Team: Midge Minium, MD as PCP - General (Family Medicine) Danis, Kirke Corin, MD as Consulting Physician (Gastroenterology) Linus Mako, MD as Consulting Physician (Family Medicine) Lubertha Sayres, MD as Referring Physician (Dermatology) Pa, [place] (Specialist)    Assessment:   This is a routine wellness examination for Karen Jones.  Exercise Activities and Dietary recommendations Current Exercise Habits: The patient does not participate in regular exercise at present, Exercise limited by: None identified   Diet (meal preparation, eat out, water intake, caffeinated beverages, dairy products, fruits and vegetables): Drinks coffee, Pepsi, and water.   Breakfast: Cereal; coffee (decaf) Lunch: pbj sandwich Dinner: fried pork chops, rice, lima beans; pot roast, potatoes, carrots; steak, potatoes; vegetable.   Goals     . Weight (lb) < 180 lb (81.6 kg)     Lose weight     . Weight (lb) < 185 lb (83.9 kg)     Lose weight by increasing activity.        Fall Risk Fall Risk  05/23/2018 05/18/2017 04/21/2016 11/20/2015  Falls in the past year? Yes No No No  Comment Tripped on sidewalk  - - -  Number falls in past yr: 1 - - -  Injury with Fall? Yes - - -  Follow up Falls prevention discussed - - -    Depression Screen PHQ 2/9 Scores 05/23/2018  05/18/2017 04/21/2016 11/20/2015  PHQ - 2 Score 0 0 0 0     Cognitive Function MMSE - Mini Mental State Exam 05/23/2018  Orientation to time 5  Orientation to Place 5  Registration 3  Attention/ Calculation 5  Recall 2  Language- name 2 objects 2  Language- repeat 1  Language- follow 3 step command 3  Language- read & follow direction 1  Write a sentence 1  Copy design 1  Total score 29        Immunization History  Administered Date(s) Administered  . Hepatitis A 03/09/2011, 09/21/2011  . Hepatitis B 01/05/2011, 03/09/2011, 03/20/2012  . Influenza, High Dose Seasonal PF 05/23/2018  . Influenza,inj,Quad PF,6+ Mos 04/21/2016, 04/26/2017  . Influenza-Unspecified 04/09/2013, 04/10/2014, 04/14/2015  . Pneumococcal Conjugate-13 03/09/2015  . Pneumococcal Polysaccharide-23 05/26/2016  . Tdap 11/22/2004, 03/27/2015  . Zoster 06/24/2010  . Zoster Recombinat (Shingrix) 02/03/2018, 05/07/2018    Screening Tests Health Maintenance  Topic Date Due  . Hepatitis C Screening  05-Jul-1951  . INFLUENZA VACCINE  02/22/2018  . COLONOSCOPY  07/03/2019  . MAMMOGRAM  10/12/2019  . TETANUS/TDAP  03/26/2025  . DEXA SCAN  Completed  . PNA vac Low Risk Adult  Completed        Plan:    Schedule bone scan.   Bring a copy of your living will and/or healthcare power of attorney to your next office visit.  Continue doing brain stimulating activities (puzzles, reading, adult coloring books, staying active) to keep memory sharp.   I have personally reviewed and  noted the following in the patient's chart:   . Medical and social history . Use of alcohol, tobacco or illicit drugs  . Current medications and supplements . Functional ability and status . Nutritional status . Physical activity . Advanced directives . List of other physicians . Hospitalizations, surgeries, and ER visits in previous 12 months . Vitals . Screenings to include cognitive, depression, and falls . Referrals and appointments  In addition, I have reviewed and discussed with patient certain preventive protocols, quality metrics, and best practice recommendations. A written personalized care plan for preventive services as well as general preventive health recommendations were provided to patient.     Gerilyn Nestle, RN  05/23/2018  Reviewed documentation provided by RN and agree w/ above.  Annye Asa, MD

## 2018-05-23 ENCOUNTER — Ambulatory Visit (INDEPENDENT_AMBULATORY_CARE_PROVIDER_SITE_OTHER): Payer: Medicare Other | Admitting: Family Medicine

## 2018-05-23 ENCOUNTER — Other Ambulatory Visit: Payer: Self-pay

## 2018-05-23 ENCOUNTER — Ambulatory Visit (INDEPENDENT_AMBULATORY_CARE_PROVIDER_SITE_OTHER): Payer: Medicare Other

## 2018-05-23 ENCOUNTER — Encounter: Payer: Self-pay | Admitting: Family Medicine

## 2018-05-23 VITALS — BP 148/70 | HR 72 | Temp 97.8°F | Resp 16 | Ht 65.0 in | Wt 196.0 lb

## 2018-05-23 VITALS — BP 132/72 | HR 72 | Temp 97.8°F | Ht 65.0 in | Wt 196.0 lb

## 2018-05-23 DIAGNOSIS — E669 Obesity, unspecified: Secondary | ICD-10-CM | POA: Diagnosis not present

## 2018-05-23 DIAGNOSIS — E2839 Other primary ovarian failure: Secondary | ICD-10-CM

## 2018-05-23 DIAGNOSIS — Z0000 Encounter for general adult medical examination without abnormal findings: Secondary | ICD-10-CM | POA: Diagnosis not present

## 2018-05-23 DIAGNOSIS — Z23 Encounter for immunization: Secondary | ICD-10-CM

## 2018-05-23 DIAGNOSIS — E039 Hypothyroidism, unspecified: Secondary | ICD-10-CM

## 2018-05-23 DIAGNOSIS — Z9189 Other specified personal risk factors, not elsewhere classified: Secondary | ICD-10-CM

## 2018-05-23 DIAGNOSIS — Z1159 Encounter for screening for other viral diseases: Secondary | ICD-10-CM

## 2018-05-23 MED ORDER — LEVOTHYROXINE SODIUM 75 MCG PO TABS: ORAL_TABLET | ORAL | 1 refills | Status: DC

## 2018-05-23 MED ORDER — MIRABEGRON ER 50 MG PO TB24: 50.0000 mg | ORAL_TABLET | Freq: Every day | ORAL | 1 refills | Status: DC

## 2018-05-23 NOTE — Progress Notes (Signed)
   Subjective:    Patient ID: Karen Jones, female    DOB: Oct 02, 1950, 67 y.o.   MRN: [id number]  HPI Hypothyroid- chronic problem, on Levothyroxine 78mcg daily.  Denies CP, SOB, HAs, visual changes, abd pain, N/V, fatigue, changes to skin/hair/nails.  Obesity- weight is stable.  BMI 32.6  Unable to exercise b/c she broke elbow 5 weeks ago.  Hep C screen- pt is due   Review of Systems For ROS see HPI     Objective:   Physical Exam  Constitutional: She is oriented to person, place, and time. She appears well-developed and well-nourished. No distress.  HENT:  Head: Normocephalic and atraumatic.  Eyes: Pupils are equal, round, and reactive to light. Conjunctivae and EOM are normal.  Neck: Normal range of motion. Neck supple. No thyromegaly present.  Cardiovascular: Normal rate, regular rhythm, normal heart sounds and intact distal pulses.  No murmur heard. Pulmonary/Chest: Effort normal and breath sounds normal. No respiratory distress.  Abdominal: Soft. She exhibits no distension. There is no tenderness.  Musculoskeletal: She exhibits no edema.  Lymphadenopathy:    She has no cervical adenopathy.  Neurological: She is alert and oriented to person, place, and time.  Skin: Skin is warm and dry.  Psychiatric: She has a normal mood and affect. Her behavior is normal.  Vitals reviewed.         Assessment & Plan:

## 2018-05-23 NOTE — Patient Instructions (Addendum)
Follow up in 1 year or as needed We'll notify you of your lab results and make any changes if needed Continue to work on healthy diet and regular exercise- you look great!! ADD the Flonase daily to improve post-nasal drip Drink plenty of fluids Call with any questions or concerns Happy Fall!!!

## 2018-05-23 NOTE — Assessment & Plan Note (Signed)
Ongoing issue for pt.  Stressed need for healthy diet and regular exercise.  Check labs to risk stratify.  Will follow 

## 2018-05-23 NOTE — Patient Instructions (Addendum)
Schedule bone scan.   Bring a copy of your living will and/or healthcare power of attorney to your next office visit.  Continue doing brain stimulating activities (puzzles, reading, adult coloring books, staying active) to keep memory sharp.   Health Maintenance, Female Adopting a healthy lifestyle and getting preventive care can go a long way to promote health and wellness. Talk with your health care provider about what schedule of regular examinations is right for you. This is a good chance for you to check in with your provider about disease prevention and staying healthy. In between checkups, there are plenty of things you can do on your own. Experts have done a lot of research about which lifestyle changes and preventive measures are most likely to keep you healthy. Ask your health care provider for more information. Weight and diet Eat a healthy diet  Be sure to include plenty of vegetables, fruits, low-fat dairy products, and lean protein.  Do not eat a lot of foods high in solid fats, added sugars, or salt.  Get regular exercise. This is one of the most important things you can do for your health. ? Most adults should exercise for at least 150 minutes each week. The exercise should increase your heart rate and make you sweat (moderate-intensity exercise). ? Most adults should also do strengthening exercises at least twice a week. This is in addition to the moderate-intensity exercise.  Maintain a healthy weight  Body mass index (BMI) is a measurement that can be used to identify possible weight problems. It estimates body fat based on height and weight. Your health care provider can help determine your BMI and help you achieve or maintain a healthy weight.  For females 20 years of age and older: ? A BMI below 18.5 is considered underweight. ? A BMI of 18.5 to 24.9 is normal. ? A BMI of 25 to 29.9 is considered overweight. ? A BMI of 30 and above is considered obese.  Watch  levels of cholesterol and blood lipids  You should start having your blood tested for lipids and cholesterol at 69 years of age, then have this test every 5 years.  You may need to have your cholesterol levels checked more often if: ? Your lipid or cholesterol levels are high. ? You are older than 69 years of age. ? You are at high risk for heart disease.  Cancer screening Lung Cancer  Lung cancer screening is recommended for adults 55-80 years old who are at high risk for lung cancer because of a history of smoking.  A yearly low-dose CT scan of the lungs is recommended for people who: ? Currently smoke. ? Have quit within the past 15 years. ? Have at least a 30-pack-year history of smoking. A pack year is smoking an average of one pack of cigarettes a day for 1 year.  Yearly screening should continue until it has been 15 years since you quit.  Yearly screening should stop if you develop a health problem that would prevent you from having lung cancer treatment.  Breast Cancer  Practice breast self-awareness. This means understanding how your breasts normally appear and feel.  It also means doing regular breast self-exams. Let your health care provider know about any changes, no matter how small.  If you are in your 20s or 30s, you should have a clinical breast exam (CBE) by a health care provider every 1-3 years as part of a regular health exam.  If you are 40 or   older, have a CBE every year. Also consider having a breast X-ray (mammogram) every year.  If you have a family history of breast cancer, talk to your health care provider about genetic screening.  If you are at high risk for breast cancer, talk to your health care provider about having an MRI and a mammogram every year.  Breast cancer gene (BRCA) assessment is recommended for women who have family members with BRCA-related cancers. BRCA-related cancers include: ? Breast. ? Ovarian. ? Tubal. ? Peritoneal  cancers.  Results of the assessment will determine the need for genetic counseling and BRCA1 and BRCA2 testing.  Cervical Cancer Your health care provider may recommend that you be screened regularly for cancer of the pelvic organs (ovaries, uterus, and vagina). This screening involves a pelvic examination, including checking for microscopic changes to the surface of your cervix (Pap test). You may be encouraged to have this screening done every 3 years, beginning at age 83.  For women ages 57-65, health care providers may recommend pelvic exams and Pap testing every 3 years, or they may recommend the Pap and pelvic exam, combined with testing for human papilloma virus (HPV), every 5 years. Some types of HPV increase your risk of cervical cancer. Testing for HPV may also be done on women of any age with unclear Pap test results.  Other health care providers may not recommend any screening for nonpregnant women who are considered low risk for pelvic cancer and who do not have symptoms. Ask your health care provider if a screening pelvic exam is right for you.  If you have had past treatment for cervical cancer or a condition that could lead to cancer, you need Pap tests and screening for cancer for at least 20 years after your treatment. If Pap tests have been discontinued, your risk factors (such as having a new sexual partner) need to be reassessed to determine if screening should resume. Some women have medical problems that increase the chance of getting cervical cancer. In these cases, your health care provider may recommend more frequent screening and Pap tests.  Colorectal Cancer  This type of cancer can be detected and often prevented.  Routine colorectal cancer screening usually begins at 69 years of age and continues through 69 years of age.  Your health care provider may recommend screening at an earlier age if you have risk factors for colon cancer.  Your health care provider may also  recommend using home test kits to check for hidden blood in the stool.  A small camera at the end of a tube can be used to examine your colon directly (sigmoidoscopy or colonoscopy). This is done to check for the earliest forms of colorectal cancer.  Routine screening usually begins at age 43.  Direct examination of the colon should be repeated every 5-10 years through 69 years of age. However, you may need to be screened more often if early forms of precancerous polyps or small growths are found.  Skin Cancer  Check your skin from head to toe regularly.  Tell your health care provider about any new moles or changes in moles, especially if there is a change in a mole's shape or color.  Also tell your health care provider if you have a mole that is larger than the size of a pencil eraser.  Always use sunscreen. Apply sunscreen liberally and repeatedly throughout the day.  Protect yourself by wearing long sleeves, pants, a wide-brimmed hat, and sunglasses whenever you are outside.  Heart disease, diabetes, and high blood pressure  High blood pressure causes heart disease and increases the risk of stroke. High blood pressure is more likely to develop in: ? People who have blood pressure in the high end of the normal range (130-139/85-89 mm Hg). ? People who are overweight or obese. ? People who are African American.  If you are 70-78 years of age, have your blood pressure checked every 3-5 years. If you are 24 years of age or older, have your blood pressure checked every year. You should have your blood pressure measured twice-once when you are at a hospital or clinic, and once when you are not at a hospital or clinic. Record the average of the two measurements. To check your blood pressure when you are not at a hospital or clinic, you can use: ? An automated blood pressure machine at a pharmacy. ? A home blood pressure monitor.  If you are between 77 years and 5 years old, ask your  health care provider if you should take aspirin to prevent strokes.  Have regular diabetes screenings. This involves taking a blood sample to check your fasting blood sugar level. ? If you are at a normal weight and have a low risk for diabetes, have this test once every three years after 69 years of age. ? If you are overweight and have a high risk for diabetes, consider being tested at a younger age or more often. Preventing infection Hepatitis B  If you have a higher risk for hepatitis B, you should be screened for this virus. You are considered at high risk for hepatitis B if: ? You were born in a country where hepatitis B is common. Ask your health care provider which countries are considered high risk. ? Your parents were born in a high-risk country, and you have not been immunized against hepatitis B (hepatitis B vaccine). ? You have HIV or AIDS. ? You use needles to inject street drugs. ? You live with someone who has hepatitis B. ? You have had sex with someone who has hepatitis B. ? You get hemodialysis treatment. ? You take certain medicines for conditions, including cancer, organ transplantation, and autoimmune conditions.  Hepatitis C  Blood testing is recommended for: ? Everyone born from [birth year] through 1967. ? Anyone with known risk factors for hepatitis C.  Sexually transmitted infections (STIs)  You should be screened for sexually transmitted infections (STIs) including gonorrhea and chlamydia if: ? You are sexually active and are younger than 69 years of age. ? You are older than 69 years of age and your health care provider tells you that you are at risk for this type of infection. ? Your sexual activity has changed since you were last screened and you are at an increased risk for chlamydia or gonorrhea. Ask your health care provider if you are at risk.  If you do not have HIV, but are at risk, it may be recommended that you take a prescription medicine daily to  prevent HIV infection. This is called pre-exposure prophylaxis (PrEP). You are considered at risk if: ? You are sexually active and do not regularly use condoms or know the HIV status of your partner(s). ? You take drugs by injection. ? You are sexually active with a partner who has HIV.  Talk with your health care provider about whether you are at high risk of being infected with HIV. If you choose to begin PrEP, you should first be tested for HIV.  You should then be tested every 3 months for as long as you are taking PrEP. Pregnancy  If you are premenopausal and you may become pregnant, ask your health care provider about preconception counseling.  If you may become pregnant, take 400 to 800 micrograms (mcg) of folic acid every day.  If you want to prevent pregnancy, talk to your health care provider about birth control (contraception). Osteoporosis and menopause  Osteoporosis is a disease in which the bones lose minerals and strength with aging. This can result in serious bone fractures. Your risk for osteoporosis can be identified using a bone density scan.  If you are 12 years of age or older, or if you are at risk for osteoporosis and fractures, ask your health care provider if you should be screened.  Ask your health care provider whether you should take a calcium or vitamin D supplement to lower your risk for osteoporosis.  Menopause may have certain physical symptoms and risks.  Hormone replacement therapy may reduce some of these symptoms and risks. Talk to your health care provider about whether hormone replacement therapy is right for you. Follow these instructions at home:  Schedule regular health, dental, and eye exams.  Stay current with your immunizations.  Do not use any tobacco products including cigarettes, chewing tobacco, or electronic cigarettes.  If you are pregnant, do not drink alcohol.  If you are breastfeeding, limit how much and how often you drink  alcohol.  Limit alcohol intake to no more than 1 drink per day for nonpregnant women. One drink equals 12 ounces of beer, 5 ounces of wine, or 1 ounces of hard liquor.  Do not use street drugs.  Do not share needles.  Ask your health care provider for help if you need support or information about quitting drugs.  Tell your health care provider if you often feel depressed.  Tell your health care provider if you have ever been abused or do not feel safe at home. This information is not intended to replace advice given to you by your health care provider. Make sure you discuss any questions you have with your health care provider. Document Released: 01/24/2011 Document Revised: 12/17/2015 Document Reviewed: 04/14/2015 Elsevier Interactive Patient Education  [employer].

## 2018-05-23 NOTE — Assessment & Plan Note (Signed)
Chronic problem.  Currently asymptomatic.  Check labs.  Adjust meds prn  

## 2018-05-24 ENCOUNTER — Encounter: Payer: Self-pay | Admitting: General Practice

## 2018-05-24 ENCOUNTER — Other Ambulatory Visit: Payer: Self-pay | Admitting: *Deleted

## 2018-05-24 ENCOUNTER — Telehealth: Payer: Self-pay | Admitting: Family Medicine

## 2018-05-24 LAB — CBC WITH DIFFERENTIAL/PLATELET
BASOS ABS: 0.1 10*3/uL (ref 0.0–0.1)
BASOS PCT: 1.3 % (ref 0.0–3.0)
EOS PCT: 6.3 % — AB (ref 0.0–5.0)
Eosinophils Absolute: 0.4 10*3/uL (ref 0.0–0.7)
HEMATOCRIT: 39.1 % (ref 36.0–46.0)
Hemoglobin: 13.1 g/dL (ref 12.0–15.0)
LYMPHS PCT: 31.1 % (ref 12.0–46.0)
Lymphs Abs: 1.8 10*3/uL (ref 0.7–4.0)
MCHC: 33.6 g/dL (ref 30.0–36.0)
MCV: 93.3 fl (ref 78.0–100.0)
MONOS PCT: 8.2 % (ref 3.0–12.0)
Monocytes Absolute: 0.5 10*3/uL (ref 0.1–1.0)
NEUTROS ABS: 3.1 10*3/uL (ref 1.4–7.7)
Neutrophils Relative %: 53.1 % (ref 43.0–77.0)
PLATELETS: 262 10*3/uL (ref 150.0–400.0)
RBC: 4.19 Mil/uL (ref 3.87–5.11)
RDW: 14.7 % (ref 11.5–15.5)
WBC: 5.9 10*3/uL (ref 4.0–10.5)

## 2018-05-24 LAB — LIPID PANEL
CHOLESTEROL: 159 mg/dL (ref 0–200)
HDL: 54.1 mg/dL (ref >39.00)
LDL Cholesterol: 83 mg/dL (ref 0–99)
NonHDL: 104.81
Total CHOL/HDL Ratio: 3
Triglycerides: 108 mg/dL (ref 0.0–149.0)
VLDL: 21.6 mg/dL (ref 0.0–40.0)

## 2018-05-24 LAB — BASIC METABOLIC PANEL
BUN: 18 mg/dL (ref 6–23)
CO2: 31 mEq/L (ref 19–32)
Calcium: 9.2 mg/dL (ref 8.4–10.5)
Chloride: 106 mEq/L (ref 96–112)
Creatinine, Ser: 0.78 mg/dL (ref 0.40–1.20)
GFR: 77.76 mL/min (ref >60.00)
GLUCOSE: 112 mg/dL — AB (ref 70–99)
POTASSIUM: 4 meq/L (ref 3.5–5.1)
SODIUM: 143 meq/L (ref 135–145)

## 2018-05-24 LAB — HEPATIC FUNCTION PANEL
ALT: 10 U/L (ref 0–35)
AST: 15 U/L (ref 0–37)
Albumin: 4.1 g/dL (ref 3.5–5.2)
Alkaline Phosphatase: 42 U/L (ref 39–117)
BILIRUBIN DIRECT: 0.1 mg/dL (ref 0.0–0.3)
BILIRUBIN TOTAL: 0.5 mg/dL (ref 0.2–1.2)
TOTAL PROTEIN: 6.6 g/dL (ref 6.0–8.3)

## 2018-05-24 LAB — TSH: TSH: 1.4 u[IU]/mL (ref 0.35–4.50)

## 2018-05-24 MED ORDER — MIRABEGRON ER 50 MG PO TB24: 50.0000 mg | ORAL_TABLET | Freq: Every day | ORAL | 1 refills | Status: DC

## 2018-05-24 MED ORDER — LEVOTHYROXINE SODIUM 75 MCG PO TABS: ORAL_TABLET | ORAL | 1 refills | Status: DC

## 2018-05-24 NOTE — Telephone Encounter (Signed)
Call to local pharmacy and canceled Rx . Rx forwarded to mail order as patient requested.

## 2018-05-24 NOTE — Telephone Encounter (Signed)
Copied from [place] [phone]. Topic: Quick Communication - Rx Refill/Question >> May 24, 2018  9:21 AM Scherrie Gerlach wrote: Medication: levothyroxine (SYNTHROID, LEVOTHROID) 75 MCG tablet mirabegron ER (MYRBETRIQ) 50 MG TB24 tablet  Pt had her CPE 10/30 and these 2 meds were sent to local [facility]. Pt needs them to go to [facility].  Can you resend to [facility] ?  Thanks. (Keep the meclizine (ANTIVERT) 25 MG tablet at [facility])  [place], [place] [phone] (Phone) [phone] (Fax)

## 2018-05-25 ENCOUNTER — Encounter: Payer: Self-pay | Admitting: Family Medicine

## 2018-05-25 ENCOUNTER — Other Ambulatory Visit (INDEPENDENT_AMBULATORY_CARE_PROVIDER_SITE_OTHER): Payer: Medicare Other

## 2018-05-25 ENCOUNTER — Other Ambulatory Visit: Payer: Self-pay | Admitting: *Deleted

## 2018-05-25 DIAGNOSIS — Z9189 Other specified personal risk factors, not elsewhere classified: Secondary | ICD-10-CM | POA: Diagnosis not present

## 2018-05-25 DIAGNOSIS — Z1159 Encounter for screening for other viral diseases: Secondary | ICD-10-CM | POA: Diagnosis not present

## 2018-05-25 MED ORDER — MECLIZINE HCL 25 MG PO TABS: 25.0000 mg | ORAL_TABLET | Freq: Three times a day (TID) | ORAL | 1 refills | Status: DC | PRN

## 2018-05-26 LAB — HEPATITIS C ANTIBODY
HEP C AB: NONREACTIVE
SIGNAL TO CUT-OFF: 0.01 (ref <1.00)

## 2018-05-28 DIAGNOSIS — M25521 Pain in right elbow: Secondary | ICD-10-CM | POA: Diagnosis not present

## 2018-05-31 DIAGNOSIS — M25521 Pain in right elbow: Secondary | ICD-10-CM | POA: Diagnosis not present

## 2018-06-06 DIAGNOSIS — M25529 Pain in unspecified elbow: Secondary | ICD-10-CM | POA: Diagnosis not present

## 2018-06-08 DIAGNOSIS — M25522 Pain in left elbow: Secondary | ICD-10-CM | POA: Diagnosis not present

## 2018-06-11 DIAGNOSIS — Z9622 Myringotomy tube(s) status: Secondary | ICD-10-CM | POA: Diagnosis not present

## 2018-06-11 DIAGNOSIS — H6981 Other specified disorders of Eustachian tube, right ear: Secondary | ICD-10-CM | POA: Diagnosis not present

## 2018-06-11 DIAGNOSIS — M25521 Pain in right elbow: Secondary | ICD-10-CM | POA: Diagnosis not present

## 2018-06-14 ENCOUNTER — Other Ambulatory Visit: Payer: Self-pay

## 2018-06-14 DIAGNOSIS — S63501D Unspecified sprain of right wrist, subsequent encounter: Secondary | ICD-10-CM | POA: Diagnosis not present

## 2018-06-14 DIAGNOSIS — S52124A Nondisplaced fracture of head of right radius, initial encounter for closed fracture: Secondary | ICD-10-CM | POA: Diagnosis not present

## 2018-06-14 DIAGNOSIS — M25521 Pain in right elbow: Secondary | ICD-10-CM | POA: Diagnosis not present

## 2018-06-18 DIAGNOSIS — I8312 Varicose veins of left lower extremity with inflammation: Secondary | ICD-10-CM | POA: Diagnosis not present

## 2018-06-18 DIAGNOSIS — I8311 Varicose veins of right lower extremity with inflammation: Secondary | ICD-10-CM | POA: Diagnosis not present

## 2018-06-18 DIAGNOSIS — M25529 Pain in unspecified elbow: Secondary | ICD-10-CM | POA: Diagnosis not present

## 2018-06-18 DIAGNOSIS — I83813 Varicose veins of bilateral lower extremities with pain: Secondary | ICD-10-CM | POA: Diagnosis not present

## 2018-06-19 ENCOUNTER — Ambulatory Visit
Admission: RE | Admit: 2018-06-19 | Discharge: 2018-06-19 | Disposition: A | Discharge status: Home / Self Care | Payer: Medicare Other | Source: Ambulatory Visit | Attending: Family Medicine | Admitting: Family Medicine

## 2018-06-19 ENCOUNTER — Encounter: Payer: Self-pay | Admitting: General Practice

## 2018-06-19 DIAGNOSIS — E2839 Other primary ovarian failure: Secondary | ICD-10-CM

## 2018-06-19 DIAGNOSIS — Z780 Asymptomatic menopausal state: Secondary | ICD-10-CM | POA: Diagnosis not present

## 2018-06-19 DIAGNOSIS — M85852 Other specified disorders of bone density and structure, left thigh: Secondary | ICD-10-CM | POA: Diagnosis not present

## 2018-06-27 DIAGNOSIS — M25521 Pain in right elbow: Secondary | ICD-10-CM | POA: Diagnosis not present

## 2018-07-05 DIAGNOSIS — M25521 Pain in right elbow: Secondary | ICD-10-CM | POA: Diagnosis not present

## 2018-07-11 DIAGNOSIS — M25521 Pain in right elbow: Secondary | ICD-10-CM | POA: Diagnosis not present

## 2018-07-12 DIAGNOSIS — I8312 Varicose veins of left lower extremity with inflammation: Secondary | ICD-10-CM | POA: Diagnosis not present

## 2018-07-30 DIAGNOSIS — I83812 Varicose veins of left lower extremities with pain: Secondary | ICD-10-CM | POA: Diagnosis not present

## 2018-07-30 DIAGNOSIS — I8312 Varicose veins of left lower extremity with inflammation: Secondary | ICD-10-CM | POA: Diagnosis not present

## 2018-07-31 DIAGNOSIS — S63501D Unspecified sprain of right wrist, subsequent encounter: Secondary | ICD-10-CM | POA: Diagnosis not present

## 2018-07-31 DIAGNOSIS — S52124D Nondisplaced fracture of head of right radius, subsequent encounter for closed fracture with routine healing: Secondary | ICD-10-CM | POA: Diagnosis not present

## 2018-08-22 DIAGNOSIS — I8311 Varicose veins of right lower extremity with inflammation: Secondary | ICD-10-CM | POA: Diagnosis not present

## 2018-08-22 DIAGNOSIS — M7981 Nontraumatic hematoma of soft tissue: Secondary | ICD-10-CM | POA: Diagnosis not present

## 2018-08-22 DIAGNOSIS — I83811 Varicose veins of right lower extremities with pain: Secondary | ICD-10-CM | POA: Diagnosis not present

## 2018-08-27 DIAGNOSIS — I8311 Varicose veins of right lower extremity with inflammation: Secondary | ICD-10-CM | POA: Diagnosis not present

## 2018-08-27 DIAGNOSIS — M7981 Nontraumatic hematoma of soft tissue: Secondary | ICD-10-CM | POA: Diagnosis not present

## 2018-08-28 ENCOUNTER — Other Ambulatory Visit: Payer: Self-pay | Admitting: Family Medicine

## 2018-08-28 DIAGNOSIS — Z1231 Encounter for screening mammogram for malignant neoplasm of breast: Secondary | ICD-10-CM

## 2018-08-30 DIAGNOSIS — S52124D Nondisplaced fracture of head of right radius, subsequent encounter for closed fracture with routine healing: Secondary | ICD-10-CM | POA: Diagnosis not present

## 2018-08-30 DIAGNOSIS — M25531 Pain in right wrist: Secondary | ICD-10-CM | POA: Diagnosis not present

## 2018-08-30 DIAGNOSIS — S63501D Unspecified sprain of right wrist, subsequent encounter: Secondary | ICD-10-CM | POA: Diagnosis not present

## 2018-09-05 DIAGNOSIS — I8311 Varicose veins of right lower extremity with inflammation: Secondary | ICD-10-CM | POA: Diagnosis not present

## 2018-09-07 DIAGNOSIS — M25531 Pain in right wrist: Secondary | ICD-10-CM | POA: Diagnosis not present

## 2018-09-18 DIAGNOSIS — S52124D Nondisplaced fracture of head of right radius, subsequent encounter for closed fracture with routine healing: Secondary | ICD-10-CM | POA: Diagnosis not present

## 2018-09-19 DIAGNOSIS — I8311 Varicose veins of right lower extremity with inflammation: Secondary | ICD-10-CM | POA: Diagnosis not present

## 2018-09-19 DIAGNOSIS — M7981 Nontraumatic hematoma of soft tissue: Secondary | ICD-10-CM | POA: Diagnosis not present

## 2018-10-17 ENCOUNTER — Ambulatory Visit: Payer: Medicare Other

## 2018-10-31 ENCOUNTER — Other Ambulatory Visit: Payer: Self-pay | Admitting: Family Medicine

## 2018-11-13 DIAGNOSIS — S63501D Unspecified sprain of right wrist, subsequent encounter: Secondary | ICD-10-CM | POA: Diagnosis not present

## 2018-11-13 DIAGNOSIS — S52124D Nondisplaced fracture of head of right radius, subsequent encounter for closed fracture with routine healing: Secondary | ICD-10-CM | POA: Diagnosis not present

## 2018-11-14 ENCOUNTER — Ambulatory Visit: Payer: Medicare Other

## 2019-01-03 ENCOUNTER — Other Ambulatory Visit: Payer: Self-pay

## 2019-01-03 ENCOUNTER — Ambulatory Visit
Admission: RE | Admit: 2019-01-03 | Discharge: 2019-01-03 | Disposition: A | Discharge status: Home / Self Care | Payer: Medicare Other | Source: Ambulatory Visit | Attending: Family Medicine | Admitting: Family Medicine

## 2019-01-03 DIAGNOSIS — Z1231 Encounter for screening mammogram for malignant neoplasm of breast: Secondary | ICD-10-CM

## 2019-03-26 DIAGNOSIS — L728 Other follicular cysts of the skin and subcutaneous tissue: Secondary | ICD-10-CM | POA: Diagnosis not present

## 2019-03-26 DIAGNOSIS — L820 Inflamed seborrheic keratosis: Secondary | ICD-10-CM | POA: Diagnosis not present

## 2019-03-26 DIAGNOSIS — L258 Unspecified contact dermatitis due to other agents: Secondary | ICD-10-CM | POA: Diagnosis not present

## 2019-04-08 ENCOUNTER — Other Ambulatory Visit: Payer: Self-pay | Admitting: Family Medicine

## 2019-04-18 DIAGNOSIS — Z23 Encounter for immunization: Secondary | ICD-10-CM | POA: Diagnosis not present

## 2019-04-25 DIAGNOSIS — S63501D Unspecified sprain of right wrist, subsequent encounter: Secondary | ICD-10-CM | POA: Diagnosis not present

## 2019-05-27 ENCOUNTER — Ambulatory Visit: Payer: Medicare Other | Admitting: Physician Assistant

## 2019-05-28 DIAGNOSIS — M79605 Pain in left leg: Secondary | ICD-10-CM | POA: Diagnosis not present

## 2019-05-28 DIAGNOSIS — M79604 Pain in right leg: Secondary | ICD-10-CM | POA: Diagnosis not present

## 2019-06-03 ENCOUNTER — Other Ambulatory Visit: Payer: Self-pay

## 2019-06-03 ENCOUNTER — Ambulatory Visit (INDEPENDENT_AMBULATORY_CARE_PROVIDER_SITE_OTHER): Payer: Medicare Other | Admitting: Physician Assistant

## 2019-06-03 ENCOUNTER — Encounter: Payer: Self-pay | Admitting: Physician Assistant

## 2019-06-03 VITALS — BP 122/64 | HR 78 | Temp 98.2°F | Resp 16 | Ht 64.0 in | Wt 190.0 lb

## 2019-06-03 DIAGNOSIS — E039 Hypothyroidism, unspecified: Secondary | ICD-10-CM

## 2019-06-03 DIAGNOSIS — E669 Obesity, unspecified: Secondary | ICD-10-CM

## 2019-06-03 DIAGNOSIS — Z0000 Encounter for general adult medical examination without abnormal findings: Secondary | ICD-10-CM

## 2019-06-03 MED ORDER — MECLIZINE HCL 25 MG PO TABS: 25.0000 mg | ORAL_TABLET | Freq: Three times a day (TID) | ORAL | 0 refills | Status: DC | PRN

## 2019-06-03 MED ORDER — MIRABEGRON ER 50 MG PO TB24: 50.0000 mg | ORAL_TABLET | Freq: Every day | ORAL | 1 refills | Status: DC

## 2019-06-03 NOTE — Progress Notes (Signed)
Subjective:   Karen Jones is a 68 y.o. female who presents for Medicare Annual (Subsequent) preventive examination.   Hypothyroidism managed with levothyroxine 75 mcg, tolerating well, takes as directed. Denies changes in bowel habits, fatigue, temperature intolerances, changes in nails and hair.   Exercise-- walking inside.  Diet-- endorses well balanced diet, has been focusing on what she eats, has lost 8 lbs through diet and exercise.   Review of Systems:  Review of Systems  Constitutional: Negative for fever and weight loss.  HENT: Negative for ear discharge, ear pain, hearing loss and tinnitus.   Eyes: Negative for blurred vision, double vision, photophobia and pain.  Respiratory: Negative for cough and shortness of breath.   Cardiovascular: Negative for chest pain and palpitations.  Gastrointestinal: Negative for abdominal pain, blood in stool, constipation, diarrhea, heartburn, melena, nausea and vomiting.  Genitourinary: Negative for dysuria, flank pain, frequency, hematuria and urgency.  Musculoskeletal: Negative for falls.  Neurological: Negative for dizziness, loss of consciousness and headaches.  Endo/Heme/Allergies: Negative for environmental allergies.  Psychiatric/Behavioral: Negative for depression, hallucinations, substance abuse and suicidal ideas. The patient is not nervous/anxious and does not have insomnia.    Objective:   Vitals: BP 122/64   Pulse 78   Temp 98.2 F (36.8 C) (Temporal)   Resp 16   Ht 5\' 4"  (1.626 m)   Wt 190 lb (86.2 kg)   SpO2 96%   BMI 32.61 kg/m   Body mass index is 32.61 kg/m.  Advanced Directives 06/03/2019 05/23/2018 05/18/2017  Does Patient Have a Medical Advance Directive? Yes Yes Yes  Type of Paramedic of [place];Living will Living will Living will  Copy of [place] in Chart? Yes - validated most recent copy scanned in chart (See row information) - -    Tobacco Social  History   Tobacco Use  Smoking Status Never Smoker  Smokeless Tobacco Never Used     Counseling given: Yes  Clinical Intake:  Pre-visit preparation completed: No  Pain : No/denies pain  BMI - recorded: 32.61 Nutritional Status: BMI > 30  Obese Diabetes: No  How often do you need to have someone help you when you read instructions, pamphlets, or other written materials from your doctor or pharmacy?: 1 - Never  Interpreter Needed?: No   Past Medical History:  Diagnosis Date  . Anemia   . Benign positional vertigo   . Cancer ([place])    skin  . Colon polyps   . Pain in limb   . Thyroid disease    Past Surgical History:  Procedure Laterality Date  . ABDOMINAL HYSTERECTOMY    . BILATERAL OOPHORECTOMY  1995  . COLONOSCOPY  1995,1997,2001,2005,2010   polyps removed every colonoscopy except 1 time,per pt  . INGUINAL HERNIA REPAIR    . POLYPECTOMY    . TONSILLECTOMY    . TUBAL LIGATION     Family History  Problem Relation Age of Onset  . Emphysema Mother   . Colon cancer Father 63  . Cancer Father   . Heart disease Father   . Heart attack Father   . Breast cancer Neg Hx    Social History   Socioeconomic History  . Marital status: Married    Spouse name: Not on file  . Number of children: Not on file  . Years of education: Not on file  . Highest education level: Not on file  Occupational History  . Occupation: Retired  Scientific laboratory technician  .  Financial resource strain: Not on file  . Food insecurity    Worry: Not on file    Inability: Not on file  . Transportation needs    Medical: Not on file    Non-medical: Not on file  Tobacco Use  . Smoking status: Never Smoker  . Smokeless tobacco: Never Used  Substance and Sexual Activity  . Alcohol use: Yes    Alcohol/week: 5.0 standard drinks    Types: 3 Glasses of wine, 2 Shots of liquor per week  . Drug use: No  . Sexual activity: Not Currently  Lifestyle  . Physical activity    Days per week: Not on file     Minutes per session: Not on file  . Stress: Not on file  Relationships  . Social Herbalist on phone: Not on file    Gets together: Not on file    Attends religious service: Not on file    Active member of club or organization: Not on file    Attends meetings of clubs or organizations: Not on file    Relationship status: Not on file  Other Topics Concern  . Not on file  Social History Narrative  . Not on file    Outpatient Encounter Medications as of 06/03/2019  Medication Sig  . ibuprofen (ADVIL,MOTRIN) 600 MG tablet   . levothyroxine (SYNTHROID) 75 MCG tablet TAKE 1 TABLET BY MOUTH  DAILY WITH BREAKFAST  . loratadine (CLARITIN) 10 MG tablet Take 10 mg by mouth daily.  . meclizine (ANTIVERT) 25 MG tablet Take 1 tablet (25 mg total) by mouth 3 (three) times daily as needed for dizziness.  . mirabegron ER (MYRBETRIQ) 50 MG TB24 tablet Take 1 tablet (50 mg total) by mouth daily.  . [DISCONTINUED] meclizine (ANTIVERT) 25 MG tablet Take 1 tablet (25 mg total) by mouth 3 (three) times daily as needed for dizziness.  . [DISCONTINUED] MYRBETRIQ 50 MG TB24 tablet TAKE 1 TABLET BY MOUTH  DAILY  . Pseudoephedrine-APAP-DM (TYLENOL COLD/FLU SEVERE DAY PO) Take by mouth.  . [DISCONTINUED] chlorpheniramine-HYDROcodone (TUSSIONEX PENNKINETIC ER) 10-8 MG/5ML SUER Take 5 mLs by mouth every 12 (twelve) hours as needed. (Patient not taking: Reported on 05/23/2018)   No facility-administered encounter medications on file as of 06/03/2019.    Activities of Daily Living In your present state of health, do you have any difficulty performing the following activities: 06/03/2019 06/03/2019  Hearing? N N  Comment wears hearing aids -  Vision? N N  Difficulty concentrating or making decisions? N N  Walking or climbing stairs? N N  Dressing or bathing? N N  Doing errands, shopping? N N  Preparing Food and eating ? - N  Using the Toilet? - N  In the past six months, have you accidently leaked  urine? - N  Do you have problems with loss of bowel control? - N  Managing your Medications? - N  Managing your Finances? - N  Housekeeping or managing your Housekeeping? - N  Some recent data might be hidden   Patient Care Team: Midge Minium, MD as PCP - General (Family Medicine) Loletha Carrow, Kirke Corin, MD as Consulting Physician (Gastroenterology) Linus Mako, MD as Consulting Physician (Family Medicine) Lubertha Sayres, MD as Referring Physician (Dermatology) [facility], [facility] (Specialist)    Assessment:   This is a routine wellness examination for [place].  Exercise Activities and Dietary recommendations Current Exercise Habits: Home exercise routine, Type of exercise: walking, Time (Minutes): 30, Frequency (Times/Week): 3,  Weekly Exercise (Minutes/Week): 90, Intensity: Mild  Goals    . Weight (lb) < 180 lb (81.6 kg)     Lose weight     . Weight (lb) < 185 lb (83.9 kg)     Lose weight by increasing activity.        Fall Risk Fall Risk  06/03/2019 06/03/2019 06/03/2019 06/14/2018 05/23/2018  Falls in the past year? 0 0 - 1 Yes  Comment - - - [facility] Telephone Survey: data to providers prior to load Tripped on sidewalk   Number falls in past yr: 0 0 0 1 1  Comment - - - Emmi Telephone Survey Actual Response = 1 -  Injury with Fall? - 0 0 1 Yes  Follow up - Falls evaluation completed Falls evaluation completed - Falls prevention discussed   Is the patient's home free of loose throw rugs in walkways, pet beds, electrical cords, etc?   yes      Grab bars in the bathroom? no      Handrails on the stairs?   yes      Adequate lighting?   yes   Depression Screen PHQ 2/9 Scores 06/03/2019 06/03/2019 05/23/2018 05/18/2017  PHQ - 2 Score 0 0 0 0     Cognitive Function MMSE - Mini Mental State Exam 06/03/2019 05/23/2018  Orientation to time 5 5  Orientation to Place 5 5  Registration 3 3  Attention/ Calculation 5 5  Recall 2 2  Language- name 2 objects 2 2  Language- repeat 1  1  Language- follow 3 step command 3 3  Language- read & follow direction 1 1  Write a sentence 1 1  Copy design 1 1  Total score 29 29        Immunization History  Administered Date(s) Administered  . Hepatitis A 03/09/2011, 09/21/2011  . Hepatitis B 01/05/2011, 03/09/2011, 03/20/2012  . Influenza, High Dose Seasonal PF 05/23/2018, 04/18/2019  . Influenza,inj,Quad PF,6+ Mos 04/21/2016, 04/26/2017  . Influenza-Unspecified 04/09/2013, 04/10/2014, 04/14/2015  . Pneumococcal Conjugate-13 03/09/2015  . Pneumococcal Polysaccharide-23 05/26/2016  . Tdap 11/22/2004, 03/27/2015  . Zoster 06/24/2010  . Zoster Recombinat (Shingrix) 02/03/2018, 05/07/2018    Qualifies for Shingles Vaccine? Shingrex UTD  Screening Tests Health Maintenance  Topic Date Due  . COLONOSCOPY  07/03/2019  . MAMMOGRAM  01/03/2020  . TETANUS/TDAP  03/26/2025  . INFLUENZA VACCINE  Completed  . DEXA SCAN  Completed  . Hepatitis C Screening  Completed  . PNA vac Low Risk Adult  Completed    Cancer Screenings: Lung: Low Dose CT Chest recommended if Age 2-80 years, 30 pack-year currently smoking OR have quit w/in 15years. Patient does not qualify. Breast:  Up to date on Mammogram? Yes   Up to date of Bone Density/Dexa? Yes Colorectal: colonoscopy UTD, due 07/03/19  Additional Screenings: Hepatitis C Screening: completed     Plan:  1. Encounter for Medicare annual wellness exam During the course of the visit the patient was educated and counseled about appropriate screening and preventive services including: Fall prevention, Bone densitometry screening, Diabetes screening, Nutrition counseling.  Patient UTD on required immunizations. MM UTD. Colonoscopy due in December. Will call GI to schedule. Will obtain fasting labs at today's visit.   Patient Instructions (the written plan) was given to the patient.   2. Hypothyroidism, unspecified type Taking medications as directed. Due for repeat labs. Will repeat  TSH today.  3. Obesity (BMI 30-39.9) Endorses 8-pound weight loss. Continue diet and exercise. Repeat fasting  labs today.   I have personally reviewed and noted the following in the patient's chart:   . Medical and social history . Use of alcohol, tobacco or illicit drugs  . Current medications and supplements . Functional ability and status . Nutritional status . Physical activity . Advanced directives . List of other physicians . Hospitalizations, surgeries, and ER visits in previous 12 months . Vitals . Screenings to include cognitive, depression, and falls . Referrals and appointments  In addition, I have reviewed and discussed with patient certain preventive protocols, quality metrics, and best practice recommendations. A written personalized care plan for preventive services as well as general preventive health recommendations were provided to patient.     Leeanne Rio, PA-C  06/03/2019

## 2019-06-03 NOTE — Patient Instructions (Addendum)
Please go to the lab for blood work.   Our office will call you with your results unless you have chosen to receive results via MyChart.  If your blood work is normal we will follow-up each year for physicals and as scheduled for chronic medical problems.  If anything is abnormal we will treat accordingly and get you in for a follow-up.   Preventive Care 70 Years and Older, Female Preventive care refers to lifestyle choices and visits with your health care provider that can promote health and wellness. This includes:  A yearly physical exam. This is also called an annual well check.  Regular dental and eye exams.  Immunizations.  Screening for certain conditions.  Healthy lifestyle choices, such as diet and exercise. What can I expect for my preventive care visit? Physical exam Your health care provider will check:  Height and weight. These may be used to calculate body mass index (BMI), which is a measurement that tells if you are at a healthy weight.  Heart rate and blood pressure.  Your skin for abnormal spots. Counseling Your health care provider may ask you questions about:  Alcohol, tobacco, and drug use.  Emotional well-being.  Home and relationship well-being.  Sexual activity.  Eating habits.  History of falls.  Memory and ability to understand (cognition).  Work and work Statistician.  Pregnancy and menstrual history. What immunizations do I need?  Influenza (flu) vaccine  This is recommended every year. Tetanus, diphtheria, and pertussis (Tdap) vaccine  You may need a Td booster every 10 years. Varicella (chickenpox) vaccine  You may need this vaccine if you have not already been vaccinated. Zoster (shingles) vaccine  You may need this after age 25. Pneumococcal conjugate (PCV13) vaccine  One dose is recommended after age 65. Pneumococcal polysaccharide (PPSV23) vaccine  One dose is recommended after age 66. Measles, mumps, and rubella  (MMR) vaccine  You may need at least one dose of MMR if you were born in 1957 or later. You may also need a second dose. Meningococcal conjugate (MenACWY) vaccine  You may need this if you have certain conditions. Hepatitis A vaccine  You may need this if you have certain conditions or if you travel or work in places where you may be exposed to hepatitis A. Hepatitis B vaccine  You may need this if you have certain conditions or if you travel or work in places where you may be exposed to hepatitis B. Haemophilus influenzae type b (Hib) vaccine  You may need this if you have certain conditions. You may receive vaccines as individual doses or as more than one vaccine together in one shot (combination vaccines). Talk with your health care provider about the risks and benefits of combination vaccines. What tests do I need? Blood tests  Lipid and cholesterol levels. These may be checked every 5 years, or more frequently depending on your overall health.  Hepatitis C test.  Hepatitis B test. Screening  Lung cancer screening. You may have this screening every year starting at age 53 if you have a 30-pack-year history of smoking and currently smoke or have quit within the past 15 years.  Colorectal cancer screening. All adults should have this screening starting at age 66 and continuing until age 20. Your health care provider may recommend screening at age 106 if you are at increased risk. You will have tests every 1-10 years, depending on your results and the type of screening test.  Diabetes screening. This is done by  checking your blood sugar (glucose) after you have not eaten for a while (fasting). You may have this done every 1-3 years.  Mammogram. This may be done every 1-2 years. Talk with your health care provider about how often you should have regular mammograms.  BRCA-related cancer screening. This may be done if you have a family history of breast, ovarian, tubal, or peritoneal  cancers. Other tests  Sexually transmitted disease (STD) testing.  Bone density scan. This is done to screen for osteoporosis. You may have this done starting at age 93. Follow these instructions at home: Eating and drinking  Eat a diet that includes fresh fruits and vegetables, whole grains, lean protein, and low-fat dairy products. Limit your intake of foods with high amounts of sugar, saturated fats, and salt.  Take vitamin and mineral supplements as recommended by your health care provider.  Do not drink alcohol if your health care provider tells you not to drink.  If you drink alcohol: ? Limit how much you have to 0-1 drink a day. ? Be aware of how much alcohol is in your drink. In the [place] one drink equals one 12 oz bottle of beer (355 mL), one 5 oz glass of wine (148 mL), or one 1 oz glass of hard liquor (44 mL). Lifestyle  Take daily care of your teeth and gums.  Stay active. Exercise for at least 30 minutes on 5 or more days each week.  Do not use any products that contain nicotine or tobacco, such as cigarettes, e-cigarettes, and chewing tobacco. If you need help quitting, ask your health care provider.  If you are sexually active, practice safe sex. Use a condom or other form of protection in order to prevent STIs (sexually transmitted infections).  Talk with your health care provider about taking a low-dose aspirin or statin. What's next?  Go to your health care provider once a year for a well check visit.  Ask your health care provider how often you should have your eyes and teeth checked.  Stay up to date on all vaccines. This information is not intended to replace advice given to you by your health care provider. Make sure you discuss any questions you have with your health care provider. Document Released: 08/07/2015 Document Revised: 07/05/2018 Document Reviewed: 07/05/2018 Elsevier Patient Education  2020 [employer]. .

## 2019-06-04 ENCOUNTER — Other Ambulatory Visit (INDEPENDENT_AMBULATORY_CARE_PROVIDER_SITE_OTHER): Payer: Medicare Other

## 2019-06-04 DIAGNOSIS — R7309 Other abnormal glucose: Secondary | ICD-10-CM

## 2019-06-04 DIAGNOSIS — I8311 Varicose veins of right lower extremity with inflammation: Secondary | ICD-10-CM | POA: Diagnosis not present

## 2019-06-04 DIAGNOSIS — I8312 Varicose veins of left lower extremity with inflammation: Secondary | ICD-10-CM | POA: Diagnosis not present

## 2019-06-04 LAB — COMPREHENSIVE METABOLIC PANEL
ALT: 10 U/L (ref 0–35)
AST: 16 U/L (ref 0–37)
Albumin: 4.1 g/dL (ref 3.5–5.2)
Alkaline Phosphatase: 46 U/L (ref 39–117)
BUN: 20 mg/dL (ref 6–23)
CO2: 30 mEq/L (ref 19–32)
Calcium: 9.2 mg/dL (ref 8.4–10.5)
Chloride: 105 mEq/L (ref 96–112)
Creatinine, Ser: 0.78 mg/dL (ref 0.40–1.20)
GFR: 72.94 mL/min (ref >60.00)
Glucose, Bld: 116 mg/dL — ABNORMAL HIGH (ref 70–99)
Potassium: 4 mEq/L (ref 3.5–5.1)
Sodium: 141 mEq/L (ref 135–145)
Total Bilirubin: 0.3 mg/dL (ref 0.2–1.2)
Total Protein: 6.3 g/dL (ref 6.0–8.3)

## 2019-06-04 LAB — HEMOGLOBIN A1C: Hgb A1c MFr Bld: 5.9 % (ref 4.6–6.5)

## 2019-06-04 LAB — LIPID PANEL
Cholesterol: 184 mg/dL (ref 0–200)
HDL: 54.6 mg/dL (ref >39.00)
LDL Cholesterol: 107 mg/dL — ABNORMAL HIGH (ref 0–99)
NonHDL: 129.88
Total CHOL/HDL Ratio: 3
Triglycerides: 115 mg/dL (ref 0.0–149.0)
VLDL: 23 mg/dL (ref 0.0–40.0)

## 2019-06-04 LAB — TSH: TSH: 0.76 u[IU]/mL (ref 0.35–4.50)

## 2019-06-05 ENCOUNTER — Encounter: Payer: Self-pay | Admitting: Gastroenterology

## 2019-06-05 ENCOUNTER — Telehealth: Payer: Self-pay | Admitting: *Deleted

## 2019-06-05 DIAGNOSIS — Z1211 Encounter for screening for malignant neoplasm of colon: Secondary | ICD-10-CM

## 2019-06-05 NOTE — Telephone Encounter (Signed)
Advised patient will proceed with referral to GI for colonoscopy. Referral placed.

## 2019-06-05 NOTE — Telephone Encounter (Signed)
She is due for recall in December per EMR -- previous colonoscopies performed by Dr. Olevia Perches. Ok to place referral to GI -- Dr. Loletha Carrow.

## 2019-06-05 NOTE — Telephone Encounter (Signed)
Patient called in and said that she was seen on Monday and she did not know when she was here the information  for her colonoscopy.  She is calling back with that information. Dr Wilfrid Lund with [facility] on [address] is who her husband sees that they would like for her to go to.

## 2019-06-05 NOTE — Addendum Note (Signed)
Addended by: Leonidas Romberg on: 06/05/2019 03:42 PM   Modules accepted: Orders

## 2019-06-10 DIAGNOSIS — I83813 Varicose veins of bilateral lower extremities with pain: Secondary | ICD-10-CM | POA: Diagnosis not present

## 2019-06-10 DIAGNOSIS — I8311 Varicose veins of right lower extremity with inflammation: Secondary | ICD-10-CM | POA: Diagnosis not present

## 2019-06-10 DIAGNOSIS — I8312 Varicose veins of left lower extremity with inflammation: Secondary | ICD-10-CM | POA: Diagnosis not present

## 2019-06-17 ENCOUNTER — Other Ambulatory Visit: Payer: Self-pay

## 2019-06-18 DIAGNOSIS — I8311 Varicose veins of right lower extremity with inflammation: Secondary | ICD-10-CM | POA: Diagnosis not present

## 2019-06-27 ENCOUNTER — Other Ambulatory Visit: Payer: Self-pay

## 2019-06-27 ENCOUNTER — Ambulatory Visit (AMBULATORY_SURGERY_CENTER): Payer: Medicare Other | Admitting: *Deleted

## 2019-06-27 VITALS — Temp 96.8°F | Ht 64.0 in | Wt 188.0 lb

## 2019-06-27 DIAGNOSIS — Z1159 Encounter for screening for other viral diseases: Secondary | ICD-10-CM

## 2019-06-27 DIAGNOSIS — Z86010 Personal history of colonic polyps: Secondary | ICD-10-CM

## 2019-06-27 MED ORDER — PLENVU 140 G PO SOLR: 1.0000 | ORAL | 0 refills | Status: DC

## 2019-06-27 NOTE — Progress Notes (Signed)
No egg or soy allergy known to patient  No issues with past sedation with any surgeries  or procedures, no intubation problems  No diet pills per patient No home 02 use per patient  No blood thinners per patient  Pt denies issues with constipation  No A fib or A flutter  EMMI video sent to pt's e mail  Plenvu  Sample [phone]  10/2019  Due to the COVID-19 pandemic we are asking patients to follow these guidelines. Please only bring one care partner. Please be aware that your care partner may wait in the car in the parking lot or if they feel like they will be too hot to wait in the car, they may wait in the lobby on the 4th floor. All care partners are required to wear a mask the entire time (we do not have any that we can provide them), they need to practice social distancing, and we will do a Covid check for all patient's and care partners when you arrive. Also we will check their temperature and your temperature. If the care partner waits in their car they need to stay in the parking lot the entire time and we will call them on their cell phone when the patient is ready for discharge so they can bring the car to the front of the building. Also all patient's will need to wear a mask into building.

## 2019-06-28 DIAGNOSIS — M7981 Nontraumatic hematoma of soft tissue: Secondary | ICD-10-CM | POA: Diagnosis not present

## 2019-06-28 DIAGNOSIS — I8312 Varicose veins of left lower extremity with inflammation: Secondary | ICD-10-CM | POA: Diagnosis not present

## 2019-07-05 ENCOUNTER — Ambulatory Visit: Payer: Medicare Other

## 2019-07-05 ENCOUNTER — Other Ambulatory Visit: Payer: Self-pay | Admitting: Gastroenterology

## 2019-07-05 DIAGNOSIS — Z1159 Encounter for screening for other viral diseases: Secondary | ICD-10-CM

## 2019-07-06 LAB — SARS CORONAVIRUS 2 (TAT 6-24 HRS): SARS Coronavirus 2: NEGATIVE

## 2019-07-10 ENCOUNTER — Other Ambulatory Visit: Payer: Self-pay

## 2019-07-10 ENCOUNTER — Ambulatory Visit (AMBULATORY_SURGERY_CENTER): Payer: Medicare Other | Admitting: Gastroenterology

## 2019-07-10 ENCOUNTER — Encounter: Payer: Self-pay | Admitting: Gastroenterology

## 2019-07-10 VITALS — BP 115/63 | HR 63 | Resp 12 | Ht 64.0 in | Wt 188.0 lb

## 2019-07-10 DIAGNOSIS — Z86010 Personal history of colonic polyps: Secondary | ICD-10-CM

## 2019-07-10 DIAGNOSIS — D124 Benign neoplasm of descending colon: Secondary | ICD-10-CM

## 2019-07-10 DIAGNOSIS — Z800 Family history of malignant neoplasm of digestive organs: Secondary | ICD-10-CM | POA: Diagnosis not present

## 2019-07-10 DIAGNOSIS — D123 Benign neoplasm of transverse colon: Secondary | ICD-10-CM | POA: Diagnosis not present

## 2019-07-10 DIAGNOSIS — D122 Benign neoplasm of ascending colon: Secondary | ICD-10-CM

## 2019-07-10 DIAGNOSIS — D125 Benign neoplasm of sigmoid colon: Secondary | ICD-10-CM | POA: Diagnosis not present

## 2019-07-10 DIAGNOSIS — D120 Benign neoplasm of cecum: Secondary | ICD-10-CM

## 2019-07-10 MED ORDER — SODIUM CHLORIDE 0.9 % IV SOLN: 500.0000 mL | Freq: Once | INTRAVENOUS | Status: DC

## 2019-07-10 NOTE — Op Note (Signed)
[facility] Patient Name: Karen Jones Procedure Date: 07/10/2019 8:34 AM MRN: [id number] Endoscopist: [place]. Loletha Carrow , MD Age: 68 Referring MD:  Date of Birth: November 06, 1950 Gender: Female Account #: [id number] Procedure:                Colonoscopy Indications:              Surveillance: Personal history of adenomatous                            polyps on last colonoscopy 5 years ago (06/2014 - 6                            polyps, some TAs, one in right colon reportedly                            25mm and removed piecemeal). Patient also has                            family history of colon cancer in father Medicines:                Monitored Anesthesia Care Procedure:                Pre-Anesthesia Assessment:                           - Prior to the procedure, a History and Physical                            was performed, and patient medications and                            allergies were reviewed. The patient's tolerance of                            previous anesthesia was also reviewed. The risks                            and benefits of the procedure and the sedation                            options and risks were discussed with the patient.                            All questions were answered, and informed consent                            was obtained. Prior Anticoagulants: The patient has                            taken no previous anticoagulant or antiplatelet                            agents. ASA Grade Assessment: II - A patient with  mild systemic disease. After reviewing the risks                            and benefits, the patient was deemed in                            satisfactory condition to undergo the procedure.                           After obtaining informed consent, the colonoscope                            was passed under direct vision. Throughout the                            procedure, the patient's blood  pressure, pulse, and                            oxygen saturations were monitored continuously. The                            Colonoscope was introduced through the anus and                            advanced to the the cecum, identified by                            appendiceal orifice and ileocecal valve. The                            colonoscopy was performed without difficulty. The                            patient tolerated the procedure well. The quality                            of the bowel preparation was excellent. The                            ileocecal valve, appendiceal orifice, and rectum                            were photographed. The bowel preparation used was                            Plenvu. Scope In: 8:42:14 AM Scope Out: 9:09:17 AM Scope Withdrawal Time: 0 hours 22 minutes 16 seconds  Total Procedure Duration: 0 hours 27 minutes 3 seconds  Findings:                 The perianal and digital rectal examinations were                            normal.  A 6 mm polyp was found in the cecum. The polyp was                            sessile. The polyp was removed with a cold snare.                            Resection and retrieval were complete.                           Three sessile polyps were found in the ascending                            colon. The polyps were 6 to 8 mm in size. These                            polyps were removed with a cold snare. Resection                            and retrieval were complete.                           Three sessile polyps were found in the descending                            colon, transverse colon and ascending colon. The                            polyps were 5 to 8 mm in size. These polyps were                            removed with a cold snare. Resection and retrieval                            were complete.                           Two sessile polyps were found in the sigmoid colon                             and descending colon. The polyps were 6 mm in size.                            These polyps were removed with a cold snare.                            Resection and retrieval were complete.                           The exam was otherwise without abnormality on                            direct and retroflexion views. Complications:  No immediate complications. Estimated Blood Loss:     Estimated blood loss was minimal. Impression:               - One 6 mm polyp in the cecum, removed with a cold                            snare. Resected and retrieved.                           - Three 6 to 8 mm polyps in the ascending colon,                            removed with a cold snare. Resected and retrieved.                           - Three 5 to 8 mm polyps in the descending colon,                            in the transverse colon and in the ascending colon,                            removed with a cold snare. Resected and retrieved.                           - Two 6 mm polyps in the sigmoid colon and in the                            descending colon, removed with a cold snare.                            Resected and retrieved.                           - The examination was otherwise normal on direct                            and retroflexion views. Recommendation:           - Patient has a contact number available for                            emergencies. The signs and symptoms of potential                            delayed complications were discussed with the                            patient. Return to normal activities tomorrow.                            Written discharge instructions were provided to the                            patient.                           -  Resume previous diet.                           - Continue present medications.                           - Await pathology results.                           - Repeat  colonoscopy is recommended for                            surveillance. The colonoscopy date will be                            determined after pathology results from today's                            exam become available for review. [name] L. Loletha Carrow, MD 07/10/2019 9:15:57 AM This report has been signed electronically.

## 2019-07-10 NOTE — Patient Instructions (Signed)
Handouts given : Polyps -9 Resume previous diet Continue current medications Await Pathology    YOU HAD AN ENDOSCOPIC PROCEDURE TODAY AT [facility]:   Refer to the procedure report that was given to you for any specific questions about what was found during the examination.  If the procedure report does not answer your questions, please call your gastroenterologist to clarify.  If you requested that your care partner not be given the details of your procedure findings, then the procedure report has been included in a sealed envelope for you to review at your convenience later.  YOU SHOULD EXPECT: Some feelings of bloating in the abdomen. Passage of more gas than usual.  Walking can help get rid of the air that was put into your GI tract during the procedure and reduce the bloating. If you had a lower endoscopy (such as a colonoscopy or flexible sigmoidoscopy) you may notice spotting of blood in your stool or on the toilet paper. If you underwent a bowel prep for your procedure, you may not have a normal bowel movement for a few days.  Please Note:  You might notice some irritation and congestion in your nose or some drainage.  This is from the oxygen used during your procedure.  There is no need for concern and it should clear up in a day or so.  SYMPTOMS TO REPORT IMMEDIATELY:   Following lower endoscopy (colonoscopy or flexible sigmoidoscopy):  Excessive amounts of blood in the stool  Significant tenderness or worsening of abdominal pains  Swelling of the abdomen that is new, acute  Fever of 100F or higher   For urgent or emergent issues, a gastroenterologist can be reached at any hour by calling [phone].   DIET:  We do recommend a small meal at first, but then you may proceed to your regular diet.  Drink plenty of fluids but you should avoid alcoholic beverages for 24 hours.  ACTIVITY:  You should plan to take it easy for the rest of today and you should NOT  DRIVE or use heavy machinery until tomorrow (because of the sedation medicines used during the test).    FOLLOW UP: Our staff will call the number listed on your records 48-72 hours following your procedure to check on you and address any questions or concerns that you may have regarding the information given to you following your procedure. If we do not reach you, we will leave a message.  We will attempt to reach you two times.  During this call, we will ask if you have developed any symptoms of COVID 19. If you develop any symptoms (ie: fever, flu-like symptoms, shortness of breath, cough etc.) before then, please call [phone].  If you test positive for Covid 19 in the 2 weeks post procedure, please call and report this information to [place].    If any biopsies were taken you will be contacted by phone or by letter within the next 1-3 weeks.  Please call us at [phone] if you have not heard about the biopsies in 3 weeks.    SIGNATURES/CONFIDENTIALITY: You and/or your care partner have signed paperwork which will be entered into your electronic medical record.  These signatures attest to the fact that that the information above on your After Visit Summary has been reviewed and is understood.  Full responsibility of the confidentiality of this discharge information lies with you and/or your care-partner.

## 2019-07-10 NOTE — Progress Notes (Signed)
To PACU, VSS. Report to [email or web address] 

## 2019-07-10 NOTE — Progress Notes (Signed)
Called to room to assist during endoscopic procedure.  Patient ID and intended procedure confirmed with present staff. Received instructions for my participation in the procedure from the performing physician.  

## 2019-07-10 NOTE — Progress Notes (Signed)
Pt's states no medical or surgical changes since previsit or office visit.  Temp taken by LC VS taken by DT 

## 2019-07-12 ENCOUNTER — Telehealth: Payer: Self-pay | Admitting: *Deleted

## 2019-07-12 NOTE — Telephone Encounter (Signed)
  Follow up Call-  Call back number 07/10/2019  Post procedure Call Back phone  # [phone]  Permission to leave phone message Yes  Some recent data might be hidden     Patient questions:  Do you have a fever, pain , or abdominal swelling? No. Pain Score  0 *  Have you tolerated food without any problems? Yes.    Have you been able to return to your normal activities? Yes.    Do you have any questions about your discharge instructions: Diet   No. Medications  No. Follow up visit  No.  Do you have questions or concerns about your Care? No.  Actions: * If pain score is 4 or above: No action needed, pain <4.  1. Have you developed a fever since your procedure? no  2.   Have you had an respiratory symptoms (SOB or cough) since your procedure? no  3.   Have you tested positive for COVID 19 since your procedure no  4.   Have you had any family members/close contacts diagnosed with the COVID 19 since your procedure?  no   If yes to any of these questions please route to Joylene John, RN and Alphonsa Gin, Therapist, sports.

## 2019-07-15 ENCOUNTER — Encounter: Payer: Self-pay | Admitting: Gastroenterology

## 2019-08-13 ENCOUNTER — Encounter: Payer: Self-pay | Admitting: Family Medicine

## 2019-08-25 ENCOUNTER — Ambulatory Visit: Payer: Medicare Other

## 2019-08-28 ENCOUNTER — Encounter: Payer: Self-pay | Admitting: Family Medicine

## 2019-08-29 ENCOUNTER — Ambulatory Visit: Payer: Medicare Other | Attending: Internal Medicine

## 2019-08-29 DIAGNOSIS — Z23 Encounter for immunization: Secondary | ICD-10-CM

## 2019-08-29 NOTE — Progress Notes (Signed)
   Covid-19 Vaccination Clinic  Name:  Karen Jones    MRN: [id number] DOB: 1950-11-24  08/29/2019  Karen Jones was observed post Covid-19 immunization for 15 minutes without incidence. She was provided with Vaccine Information Sheet and instruction to access the V-Safe system.   Karen Jones was instructed to call 911 with any severe reactions post vaccine: Marland Kitchen Difficulty breathing  . Swelling of your face and throat  . A fast heartbeat  . A bad rash all over your body  . Dizziness and weakness    Immunizations Administered    Name Date Dose VIS Date Route   Pfizer COVID-19 Vaccine 08/29/2019  3:50 PM 0.3 mL 07/05/2019 Intramuscular   Manufacturer: [place]   Lot: [id number]   [place]: [id number]

## 2019-08-30 ENCOUNTER — Ambulatory Visit: Payer: Medicare Other

## 2019-09-23 ENCOUNTER — Ambulatory Visit: Payer: Medicare Other | Attending: Internal Medicine

## 2019-09-23 DIAGNOSIS — Z23 Encounter for immunization: Secondary | ICD-10-CM | POA: Insufficient documentation

## 2019-09-23 NOTE — Progress Notes (Signed)
   Covid-19 Vaccination Clinic  Name:  Karen Jones    MRN: [id number] DOB: 1951-04-08  09/23/2019  [name] was observed post Covid-19 immunization for 30 minutes based on pre-vaccination screening without incidence. She was provided with Vaccine Information Sheet and instruction to access the V-Safe system.   [name] was instructed to call 911 with any severe reactions post vaccine: Marland Kitchen Difficulty breathing  . Swelling of your face and throat  . A fast heartbeat  . A bad rash all over your body  . Dizziness and weakness    Immunizations Administered    Name Date Dose VIS Date Route   Pfizer COVID-19 Vaccine 09/23/2019  5:29 PM 0.3 mL 07/05/2019 Intramuscular   Manufacturer: [place]   Lot: [id number]   [place]: [id number]

## 2019-09-27 ENCOUNTER — Encounter: Payer: Self-pay | Admitting: Family Medicine

## 2019-11-27 ENCOUNTER — Other Ambulatory Visit: Payer: Self-pay | Admitting: Family Medicine

## 2019-11-27 DIAGNOSIS — Z1231 Encounter for screening mammogram for malignant neoplasm of breast: Secondary | ICD-10-CM

## 2020-01-07 ENCOUNTER — Ambulatory Visit
Admission: RE | Admit: 2020-01-07 | Discharge: 2020-01-07 | Disposition: A | Discharge status: Home / Self Care | Payer: Medicare Other | Source: Ambulatory Visit | Attending: Family Medicine | Admitting: Family Medicine

## 2020-01-07 ENCOUNTER — Other Ambulatory Visit: Payer: Self-pay

## 2020-01-07 DIAGNOSIS — Z1231 Encounter for screening mammogram for malignant neoplasm of breast: Secondary | ICD-10-CM

## 2020-02-12 DIAGNOSIS — H2513 Age-related nuclear cataract, bilateral: Secondary | ICD-10-CM | POA: Diagnosis not present

## 2020-03-23 DIAGNOSIS — I8312 Varicose veins of left lower extremity with inflammation: Secondary | ICD-10-CM | POA: Diagnosis not present

## 2020-03-23 DIAGNOSIS — I8311 Varicose veins of right lower extremity with inflammation: Secondary | ICD-10-CM | POA: Diagnosis not present

## 2020-03-23 DIAGNOSIS — I83893 Varicose veins of bilateral lower extremities with other complications: Secondary | ICD-10-CM | POA: Diagnosis not present

## 2020-03-25 DIAGNOSIS — I8311 Varicose veins of right lower extremity with inflammation: Secondary | ICD-10-CM | POA: Diagnosis not present

## 2020-03-25 DIAGNOSIS — I8312 Varicose veins of left lower extremity with inflammation: Secondary | ICD-10-CM | POA: Diagnosis not present

## 2020-03-26 DIAGNOSIS — I83813 Varicose veins of bilateral lower extremities with pain: Secondary | ICD-10-CM | POA: Diagnosis not present

## 2020-03-26 DIAGNOSIS — M79605 Pain in left leg: Secondary | ICD-10-CM | POA: Diagnosis not present

## 2020-03-26 DIAGNOSIS — M79604 Pain in right leg: Secondary | ICD-10-CM | POA: Diagnosis not present

## 2020-04-02 DIAGNOSIS — Z23 Encounter for immunization: Secondary | ICD-10-CM | POA: Diagnosis not present

## 2020-04-07 ENCOUNTER — Other Ambulatory Visit: Payer: Self-pay | Admitting: Physician Assistant

## 2020-04-27 ENCOUNTER — Other Ambulatory Visit: Payer: Self-pay | Admitting: Family Medicine

## 2020-05-04 DIAGNOSIS — Z23 Encounter for immunization: Secondary | ICD-10-CM | POA: Diagnosis not present

## 2020-05-14 ENCOUNTER — Other Ambulatory Visit (HOSPITAL_COMMUNITY): Payer: Self-pay | Admitting: Family Medicine

## 2020-05-14 DIAGNOSIS — I739 Peripheral vascular disease, unspecified: Secondary | ICD-10-CM

## 2020-05-15 ENCOUNTER — Other Ambulatory Visit: Payer: Self-pay

## 2020-05-15 ENCOUNTER — Ambulatory Visit (HOSPITAL_COMMUNITY)
Admission: RE | Admit: 2020-05-15 | Discharge: 2020-05-15 | Disposition: A | Discharge status: Home / Self Care | Payer: Medicare Other | Source: Ambulatory Visit | Attending: Vascular Surgery | Admitting: Vascular Surgery

## 2020-05-15 DIAGNOSIS — I739 Peripheral vascular disease, unspecified: Secondary | ICD-10-CM | POA: Diagnosis not present

## 2020-05-20 DIAGNOSIS — L820 Inflamed seborrheic keratosis: Secondary | ICD-10-CM | POA: Diagnosis not present

## 2020-05-20 DIAGNOSIS — L728 Other follicular cysts of the skin and subcutaneous tissue: Secondary | ICD-10-CM | POA: Diagnosis not present

## 2020-06-05 NOTE — Progress Notes (Signed)
Subjective:   Karen Jones is a 69 y.o. female who presents for Medicare Annual (Subsequent) preventive examination.  Review of Systems     Cardiac Risk Factors include: advanced age (>63men, >54 women);obesity (BMI >30kg/m2)     Objective:    Today's Vitals   06/08/20 1020  BP: 126/66  Pulse: 72  Resp: 16  Temp: 97.8 F (36.6 C)  TempSrc: Temporal  SpO2: 95%  Weight: 191 lb 12.8 oz (87 kg)  Height: 5\' 5"  (1.651 m)   Body mass index is 31.92 kg/m.  Advanced Directives 06/08/2020 06/03/2019 05/23/2018 05/18/2017  Does Patient Have a Medical Advance Directive? Yes Yes Yes Yes  Type of Advance Directive Living will Karen Jones;Living will Living will Living will  Copy of Karen Jones in Chart? - Yes - validated most recent copy scanned in chart (See row information) - -    Current Medications (verified) Outpatient Encounter Medications as of 06/08/2020  Medication Sig  . ibuprofen (ADVIL,MOTRIN) 600 MG tablet   . levothyroxine (SYNTHROID) 75 MCG tablet TAKE 1 TABLET BY MOUTH  DAILY WITH BREAKFAST  . loratadine (CLARITIN) 10 MG tablet Take 10 mg by mouth daily.  . meclizine (ANTIVERT) 25 MG tablet Take 1 tablet (25 mg total) by mouth 3 (three) times daily as needed for dizziness.  Marland Kitchen MYRBETRIQ 50 MG TB24 tablet TAKE 1 TABLET BY MOUTH  DAILY  . Pseudoephedrine-APAP-DM (TYLENOL COLD/FLU SEVERE DAY PO) Take by mouth. (Patient not taking: Reported on 06/08/2020)   No facility-administered encounter medications on file as of 06/08/2020.    Allergies (verified) Penicillins and Levofloxacin   History: Past Medical History:  Diagnosis Date  . Allergy   . Anemia   . Benign positional vertigo   . Cancer ([place])    skin  . Colon polyps   . Pain in limb   . Thyroid disease    Past Surgical History:  Procedure Laterality Date  . ABDOMINAL HYSTERECTOMY    . BILATERAL OOPHORECTOMY  1995  . COLONOSCOPY   1995,1997,2001,2005,2010   polyps removed every colonoscopy except 1 time,per pt  . HAND SURGERY     to remove webbing between fingers   . POLYPECTOMY    . TONSILLECTOMY    . TUBAL LIGATION    . UMBILICAL HERNIA REPAIR     Family History  Problem Relation Age of Onset  . Emphysema Mother   . Colon cancer Father 40  . Cancer Father   . Heart disease Father   . Heart attack Father   . Colon polyps Brother   . Breast cancer Neg Hx   . Esophageal cancer Neg Hx   . Rectal cancer Neg Hx   . Stomach cancer Neg Hx    Social History   Socioeconomic History  . Marital status: Married    Spouse name: Not on file  . Number of children: Not on file  . Years of education: Not on file  . Highest education level: Not on file  Occupational History  . Occupation: Retired  Tobacco Use  . Smoking status: Never Smoker  . Smokeless tobacco: Never Used  Vaping Use  . Vaping Use: Never used  Substance and Sexual Activity  . Alcohol use: Yes    Alcohol/week: 5.0 standard drinks    Types: 3 Glasses of wine, 2 Shots of liquor per week  . Drug use: No  . Sexual activity: Not Currently  Other Topics Concern  . Not on file  Social History Narrative  . Not on file   Social Determinants of Health   Financial Resource Strain: Low Risk   . Difficulty of Paying Living Expenses: Not hard at all  Food Insecurity: No Food Insecurity  . Worried About Charity fundraiser in the Last Year: Never true  . Ran Out of Food in the Last Year: Never true  Transportation Needs: No Transportation Needs  . Lack of Transportation (Medical): No  . Lack of Transportation (Non-Medical): No  Physical Activity: Insufficiently Active  . Days of Exercise per Week: 3 days  . Minutes of Exercise per Session: 30 min  Stress: No Stress Concern Present  . Feeling of Stress : Not at all  Social Connections: Socially Integrated  . Frequency of Communication with Friends and Family: More than three times a week  .  Frequency of Social Gatherings with Friends and Family: Once a week  . Attends Religious Services: More than 4 times per year  . Active Member of Clubs or Organizations: Yes  . Attends Archivist Meetings: More than 4 times per year  . Marital Status: Married    Tobacco Counseling Counseling given: Not Answered   Clinical Intake:  Pre-visit preparation completed: Yes  Pain : No/denies pain     Nutritional Status: BMI > 30  Obese Nutritional Risks: None Diabetes: No  How often do you need to have someone help you when you read instructions, pamphlets, or other written materials from your doctor or pharmacy?: 1 - Never What is the last grade level you completed in school?: 1 yr of college  Diabetic?No  Interpreter Needed?: No  Information entered by :: Caroleen Hamman LPN   Activities of Daily Living In your present state of health, do you have any difficulty performing the following activities: 06/08/2020  Hearing? N  Vision? N  Difficulty concentrating or making decisions? N  Walking or climbing stairs? N  Dressing or bathing? N  Doing errands, shopping? N  Preparing Food and eating ? N  Using the Toilet? N  In the past six months, have you accidently leaked urine? Y  Do you have problems with loss of bowel control? N  Managing your Medications? N  Managing your Finances? N  Housekeeping or managing your Housekeeping? N  Some recent data might be hidden    Patient Care Team: Midge Minium, MD as PCP - General (Family Medicine) Loletha Carrow, Kirke Corin, MD as Consulting Physician (Gastroenterology) Linus Mako, MD as Consulting Physician (Family Medicine) Lubertha Sayres, MD as Referring Physician (Dermatology) [facility] (Specialist)  Indicate any recent Medical Services you may have received from other than [facility] providers in the past year (date may be approximate).     Assessment:   This is a routine wellness examination for  Karen Jones.  Hearing/Vision screen  Hearing Screening   125Hz  250Hz  500Hz  1000Hz  2000Hz  3000Hz  4000Hz  6000Hz  8000Hz   Right ear:           Left ear:           Comments: Bilateral hearing aids  Vision Screening Comments: Reading glasses Last eye exam-03/2020-Dr. Oswaldo Conroy  Dietary issues and exercise activities discussed: Current Exercise Habits: Home exercise routine, Type of exercise: walking, Time (Minutes): 30, Frequency (Times/Week): 3, Weekly Exercise (Minutes/Week): 90, Intensity: Mild, Exercise limited by: None identified  Goals    . Weight (lb) < 180 lb (81.6 kg)     Lose weight       Depression Screen PHQ  2/9 Scores 06/08/2020 06/03/2019 06/03/2019 05/23/2018 05/18/2017 04/21/2016 11/20/2015  PHQ - 2 Score 0 0 0 0 0 0 0    Fall Risk Fall Risk  06/08/2020 06/17/2019 06/03/2019 06/03/2019 06/03/2019  Falls in the past year? 0 0 0 0 -  Comment - Emmi Telephone Survey: data to providers prior to load - - -  Number falls in past yr: 0 - 0 0 0  Comment - - - - -  Injury with Fall? 0 - - 0 0  Follow up Falls prevention discussed - - Falls evaluation completed Falls evaluation completed    Any stairs in or around the home? Yes  If so, are there any without handrails? No  Home free of loose throw rugs in walkways, pet beds, electrical cords, etc? Yes  Adequate lighting in your home to reduce risk of falls? Yes   ASSISTIVE DEVICES UTILIZED TO PREVENT FALLS:  Life alert? No  Use of a cane, walker or w/c? No  Grab bars in the bathroom? Yes  Shower chair or bench in shower? No  Elevated toilet seat or a handicapped toilet? No   TIMED UP AND GO:  Was the test performed? Yes .  Length of time to ambulate 10 feet: 9 sec.   Gait steady and fast without use of assistive device  Cognitive Function:No cognitive impairment noted MMSE - Mini Mental State Exam 06/03/2019 05/23/2018  Orientation to time 5 5  Orientation to Place 5 5  Registration 3 3  Attention/ Calculation 5 5  Recall 2 2   Language- name 2 objects 2 2  Language- repeat 1 1  Language- follow 3 step command 3 3  Language- read & follow direction 1 1  Write a sentence 1 1  Copy design 1 1  Total score 29 29        Immunizations Immunization History  Administered Date(s) Administered  . Hepatitis A 03/09/2011, 09/21/2011  . Hepatitis B 01/05/2011, 03/09/2011, 03/20/2012  . Influenza, High Dose Seasonal PF 05/23/2018, 04/18/2019  . Influenza,inj,Quad PF,6+ Mos 04/21/2016, 04/26/2017  . Influenza-Unspecified 04/10/2014, 04/14/2015, 04/09/2020  . PFIZER SARS-COV-2 Vaccination 08/29/2019, 09/23/2019, 05/04/2020  . Pneumococcal Conjugate-13 03/09/2015  . Pneumococcal Polysaccharide-23 05/26/2016  . Tdap 11/22/2004, 03/27/2015  . Zoster 06/24/2010  . Zoster Recombinat (Shingrix) 02/03/2018, 05/07/2018    TDAP status: Up to date   Flu Vaccine status: Up to date   Pneumococcal vaccine status: Up to date   Covid-19 vaccine status: Completed vaccines  Qualifies for Shingles Vaccine? No   Zostavax completed Yes   Shingrix Completed?: Yes  Screening Tests Health Maintenance  Topic Date Due  . MAMMOGRAM  01/06/2021  . COLONOSCOPY  07/09/2021  . TETANUS/TDAP  03/26/2025  . INFLUENZA VACCINE  Completed  . DEXA SCAN  Completed  . COVID-19 Vaccine  Completed  . Hepatitis C Screening  Completed  . PNA vac Low Risk Adult  Completed    Health Maintenance  There are no preventive care reminders to display for this patient.  Colorectal cancer screening: Completed Colonoscopy 07/10/2019. Repeat every 2 years Mammogram status: Completed Bilateral 01/07/2020. Repeat every year Bone Density status: Completed 06/19/2018. Results reflect: Bone density results: OSTEOPENIA. Repeat every 2 years.  Lung Cancer Screening: (Low Dose CT Chest recommended if Age 81-80 years, 30 pack-year currently smoking OR have quit w/in 15years.) does not qualify.     Additional Screening:  Hepatitis C Screening:   Completed 05/25/2018  Vision Screening: Recommended annual ophthalmology exams for early detection of  glaucoma and other disorders of the eye. Is the patient up to date with their annual eye exam?  Yes  Who is the provider or what is the name of the office in which the patient attends annual eye exams? Dr. Oswaldo Conroy   Dental Screening: Recommended annual dental exams for proper oral hygiene  Community Resource Referral / Chronic Care Management: CRR required this visit?  No   CCM required this visit?  No      Plan:     I have personally reviewed and noted the following in the patient's chart:   . Medical and social history . Use of alcohol, tobacco or illicit drugs  . Current medications and supplements . Functional ability and status . Nutritional status . Physical activity . Advanced directives . List of other physicians . Hospitalizations, surgeries, and ER visits in previous 12 months . Vitals . Screenings to include cognitive, depression, and falls . Referrals and appointments  In addition, I have reviewed and discussed with patient certain preventive protocols, quality metrics, and best practice recommendations. A written personalized care plan for preventive services as well as general preventive health recommendations were provided to patient.     Marta Antu, LPN   [id number]  Nurse Health Advisor  Nurse Notes: None

## 2020-06-08 ENCOUNTER — Encounter: Payer: Self-pay | Admitting: Family Medicine

## 2020-06-08 ENCOUNTER — Ambulatory Visit (INDEPENDENT_AMBULATORY_CARE_PROVIDER_SITE_OTHER): Payer: Medicare Other | Admitting: Family Medicine

## 2020-06-08 ENCOUNTER — Ambulatory Visit (INDEPENDENT_AMBULATORY_CARE_PROVIDER_SITE_OTHER): Payer: Medicare Other

## 2020-06-08 ENCOUNTER — Other Ambulatory Visit: Payer: Self-pay

## 2020-06-08 VITALS — BP 126/66 | HR 72 | Temp 97.8°F | Resp 16 | Ht 65.0 in | Wt 191.8 lb

## 2020-06-08 DIAGNOSIS — E669 Obesity, unspecified: Secondary | ICD-10-CM | POA: Diagnosis not present

## 2020-06-08 DIAGNOSIS — Z0000 Encounter for general adult medical examination without abnormal findings: Secondary | ICD-10-CM

## 2020-06-08 DIAGNOSIS — E039 Hypothyroidism, unspecified: Secondary | ICD-10-CM | POA: Diagnosis not present

## 2020-06-08 DIAGNOSIS — N3281 Overactive bladder: Secondary | ICD-10-CM | POA: Diagnosis not present

## 2020-06-08 DIAGNOSIS — Z780 Asymptomatic menopausal state: Secondary | ICD-10-CM

## 2020-06-08 LAB — CBC WITH DIFFERENTIAL/PLATELET
Basophils Absolute: 0.1 10*3/uL (ref 0.0–0.1)
Basophils Relative: 1.2 % (ref 0.0–3.0)
Eosinophils Absolute: 0.2 10*3/uL (ref 0.0–0.7)
Eosinophils Relative: 3.3 % (ref 0.0–5.0)
HCT: 40.8 % (ref 36.0–46.0)
Hemoglobin: 13.5 g/dL (ref 12.0–15.0)
Lymphocytes Relative: 33.2 % (ref 12.0–46.0)
Lymphs Abs: 1.8 10*3/uL (ref 0.7–4.0)
MCHC: 33 g/dL (ref 30.0–36.0)
MCV: 92.2 fl (ref 78.0–100.0)
Monocytes Absolute: 0.4 10*3/uL (ref 0.1–1.0)
Monocytes Relative: 8.2 % (ref 3.0–12.0)
Neutro Abs: 2.9 10*3/uL (ref 1.4–7.7)
Neutrophils Relative %: 54.1 % (ref 43.0–77.0)
Platelets: 276 10*3/uL (ref 150.0–400.0)
RBC: 4.43 Mil/uL (ref 3.87–5.11)
RDW: 14.2 % (ref 11.5–15.5)
WBC: 5.4 10*3/uL (ref 4.0–10.5)

## 2020-06-08 LAB — BASIC METABOLIC PANEL
BUN: 15 mg/dL (ref 6–23)
CO2: 31 mEq/L (ref 19–32)
Calcium: 9 mg/dL (ref 8.4–10.5)
Chloride: 105 mEq/L (ref 96–112)
Creatinine, Ser: 0.8 mg/dL (ref 0.40–1.20)
GFR: 74.17 mL/min (ref >60.00)
Glucose, Bld: 84 mg/dL (ref 70–99)
Potassium: 4.1 mEq/L (ref 3.5–5.1)
Sodium: 143 mEq/L (ref 135–145)

## 2020-06-08 LAB — HEPATIC FUNCTION PANEL
ALT: 11 U/L (ref 0–35)
AST: 16 U/L (ref 0–37)
Albumin: 4.1 g/dL (ref 3.5–5.2)
Alkaline Phosphatase: 42 U/L (ref 39–117)
Bilirubin, Direct: 0.1 mg/dL (ref 0.0–0.3)
Total Bilirubin: 0.5 mg/dL (ref 0.2–1.2)
Total Protein: 6.6 g/dL (ref 6.0–8.3)

## 2020-06-08 LAB — TSH: TSH: 1.05 u[IU]/mL (ref 0.35–4.50)

## 2020-06-08 LAB — LIPID PANEL
Cholesterol: 170 mg/dL (ref 0–200)
HDL: 54.9 mg/dL (ref >39.00)
LDL Cholesterol: 92 mg/dL (ref 0–99)
NonHDL: 114.66
Total CHOL/HDL Ratio: 3
Triglycerides: 112 mg/dL (ref 0.0–149.0)
VLDL: 22.4 mg/dL (ref 0.0–40.0)

## 2020-06-08 MED ORDER — LEVOTHYROXINE SODIUM 75 MCG PO TABS: 75.0000 ug | ORAL_TABLET | Freq: Every day | ORAL | 3 refills | Status: DC

## 2020-06-08 MED ORDER — SOLIFENACIN SUCCINATE 5 MG PO TABS: 5.0000 mg | ORAL_TABLET | Freq: Every day | ORAL | 3 refills | Status: DC

## 2020-06-08 NOTE — Patient Instructions (Addendum)
Follow up in 1 year or as needed We'll notify you of your lab results and make any changes if needed Continue to work on healthy diet and regular exercise- you can do it! We'll call you with your urology referral STOP the Myrbetriq once you are out and START the Vesicare Congratulations on being up to date on all the things!!! Call with any questions or concerns Stay Safe!  Stay Healthy! Happy Holidays!

## 2020-06-08 NOTE — Assessment & Plan Note (Signed)
Ongoing issue for pt.  Discussed the need for healthy diet and regular exercise.  Check labs to risk stratify.  Will continue to follow.

## 2020-06-08 NOTE — Patient Instructions (Signed)
Karen Jones , Thank you for taking time to come for your Medicare Wellness Visit. I appreciate your ongoing commitment to your health goals. Please review the following plan we discussed and let me know if I can assist you in the future.   Screening recommendations/referrals: Colonoscopy: Completed 07/10/2019- Due 07/09/2021 Mammogram: Completed 01/07/2020-Due 01/06/2021 Bone Density: Ordered today. Someone will be calling you to schedule Recommended yearly ophthalmology/optometry visit for glaucoma screening and checkup Recommended yearly dental visit for hygiene and checkup  Vaccinations: Influenza vaccine: Up to date Pneumococcal vaccine: Completed vaccines Tdap vaccine: Up to date- Due-03/26/2025 Shingles vaccine: Completed vaccines Covid-19:Completed vaccines  Advanced directives: Copy in chart  Conditions/risks identified: See problem list  Next appointment: Follow up in one year for your annual wellness visit    Preventive Care 55 Years and Older, Female Preventive care refers to lifestyle choices and visits with your health care provider that can promote health and wellness. What does preventive care include?  A yearly physical exam. This is also called an annual well check.  Dental exams once or twice a year.  Routine eye exams. Ask your health care provider how often you should have your eyes checked.  Personal lifestyle choices, including:  Daily care of your teeth and gums.  Regular physical activity.  Eating a healthy diet.  Avoiding tobacco and drug use.  Limiting alcohol use.  Practicing safe sex.  Taking low-dose aspirin every day.  Taking vitamin and mineral supplements as recommended by your health care provider. What happens during an annual well check? The services and screenings done by your health care provider during your annual well check will depend on your age, overall health, lifestyle risk factors, and family history of disease. Counseling    Your health care provider may ask you questions about your:  Alcohol use.  Tobacco use.  Drug use.  Emotional well-being.  Home and relationship well-being.  Sexual activity.  Eating habits.  History of falls.  Memory and ability to understand (cognition).  Work and work Statistician.  Reproductive health. Screening  You may have the following tests or measurements:  Height, weight, and BMI.  Blood pressure.  Lipid and cholesterol levels. These may be checked every 5 years, or more frequently if you are over 84 years old.  Skin check.  Lung cancer screening. You may have this screening every year starting at age 82 if you have a 30-pack-year history of smoking and currently smoke or have quit within the past 15 years.  Fecal occult blood test (FOBT) of the stool. You may have this test every year starting at age 59.  Flexible sigmoidoscopy or colonoscopy. You may have a sigmoidoscopy every 5 years or a colonoscopy every 10 years starting at age 22.  Hepatitis C blood test.  Hepatitis B blood test.  Sexually transmitted disease (STD) testing.  Diabetes screening. This is done by checking your blood sugar (glucose) after you have not eaten for a while (fasting). You may have this done every 1-3 years.  Bone density scan. This is done to screen for osteoporosis. You may have this done starting at age 61.  Mammogram. This may be done every 1-2 years. Talk to your health care provider about how often you should have regular mammograms. Talk with your health care provider about your test results, treatment options, and if necessary, the need for more tests. Vaccines  Your health care provider may recommend certain vaccines, such as:  Influenza vaccine. This is recommended every year.  Tetanus, diphtheria, and acellular pertussis (Tdap, Td) vaccine. You may need a Td booster every 10 years.  Zoster vaccine. You may need this after age 43.  Pneumococcal 13-valent  conjugate (PCV13) vaccine. One dose is recommended after age 68.  Pneumococcal polysaccharide (PPSV23) vaccine. One dose is recommended after age 17. Talk to your health care provider about which screenings and vaccines you need and how often you need them. This information is not intended to replace advice given to you by your health care provider. Make sure you discuss any questions you have with your health care provider. Document Released: 08/07/2015 Document Revised: 03/30/2016 Document Reviewed: 05/12/2015 Elsevier Interactive Patient Education  2017 [place] Prevention in the Home Falls can cause injuries. They can happen to people of all ages. There are many things you can do to make your home safe and to help prevent falls. What can I do on the outside of my home?  Regularly fix the edges of walkways and driveways and fix any cracks.  Remove anything that might make you trip as you walk through a door, such as a raised step or threshold.  Trim any bushes or trees on the path to your home.  Use bright outdoor lighting.  Clear any walking paths of anything that might make someone trip, such as rocks or tools.  Regularly check to see if handrails are loose or broken. Make sure that both sides of any steps have handrails.  Any raised decks and porches should have guardrails on the edges.  Have any leaves, snow, or ice cleared regularly.  Use sand or salt on walking paths during winter.  Clean up any spills in your garage right away. This includes oil or grease spills. What can I do in the bathroom?  Use night lights.  Install grab bars by the toilet and in the tub and shower. Do not use towel bars as grab bars.  Use non-skid mats or decals in the tub or shower.  If you need to sit down in the shower, use a plastic, non-slip stool.  Keep the floor dry. Clean up any water that spills on the floor as soon as it happens.  Remove soap buildup in the tub or  shower regularly.  Attach bath mats securely with double-sided non-slip rug tape.  Do not have throw rugs and other things on the floor that can make you trip. What can I do in the bedroom?  Use night lights.  Make sure that you have a light by your bed that is easy to reach.  Do not use any sheets or blankets that are too big for your bed. They should not hang down onto the floor.  Have a firm chair that has side arms. You can use this for support while you get dressed.  Do not have throw rugs and other things on the floor that can make you trip. What can I do in the kitchen?  Clean up any spills right away.  Avoid walking on wet floors.  Keep items that you use a lot in easy-to-reach places.  If you need to reach something above you, use a strong step stool that has a grab bar.  Keep electrical cords out of the way.  Do not use floor polish or wax that makes floors slippery. If you must use wax, use non-skid floor wax.  Do not have throw rugs and other things on the floor that can make you trip. What can I do  with my stairs?  Do not leave any items on the stairs.  Make sure that there are handrails on both sides of the stairs and use them. Fix handrails that are broken or loose. Make sure that handrails are as long as the stairways.  Check any carpeting to make sure that it is firmly attached to the stairs. Fix any carpet that is loose or worn.  Avoid having throw rugs at the top or bottom of the stairs. If you do have throw rugs, attach them to the floor with carpet tape.  Make sure that you have a light switch at the top of the stairs and the bottom of the stairs. If you do not have them, ask someone to add them for you. What else can I do to help prevent falls?  Wear shoes that:  Do not have high heels.  Have rubber bottoms.  Are comfortable and fit you well.  Are closed at the toe. Do not wear sandals.  If you use a stepladder:  Make sure that it is fully  opened. Do not climb a closed stepladder.  Make sure that both sides of the stepladder are locked into place.  Ask someone to hold it for you, if possible.  Clearly mark and make sure that you can see:  Any grab bars or handrails.  First and last steps.  Where the edge of each step is.  Use tools that help you move around (mobility aids) if they are needed. These include:  Canes.  Walkers.  Scooters.  Crutches.  Turn on the lights when you go into a dark area. Replace any light bulbs as soon as they burn out.  Set up your furniture so you have a clear path. Avoid moving your furniture around.  If any of your floors are uneven, fix them.  If there are any pets around you, be aware of where they are.  Review your medicines with your doctor. Some medicines can make you feel dizzy. This can increase your chance of falling. Ask your doctor what other things that you can do to help prevent falls. This information is not intended to replace advice given to you by your health care provider. Make sure you discuss any questions you have with your health care provider. Document Released: 05/07/2009 Document Revised: 12/17/2015 Document Reviewed: 08/15/2014 Elsevier Interactive Patient Education  2017 [employer].

## 2020-06-08 NOTE — Assessment & Plan Note (Signed)
Chronic problem.  Pt reports energy level is good but she has dry skin/hair.  This may be seasonal due to colder weather.  Check labs.  Adjust meds prn

## 2020-06-08 NOTE — Assessment & Plan Note (Signed)
Ongoing issue for pt.  She also has incomplete emptying and overflow incontinence if she attempts to hold her urine for too long.  Given the price of Myrbetriq, will switch to Vesicare while awaiting a urology referral.  Pt expressed understanding and is in agreement w/ plan.

## 2020-06-08 NOTE — Progress Notes (Signed)
   Subjective:    Patient ID: Karen Jones, female    DOB: [birth year], 69 y.o.   MRN: [id number]  HPI Hypothyroid- chronic problem, on Levothyroxine 19mcg daily.  Pt reports energy level is good.  + dry skin.  No hair loss.  Obesity- BMI is 31.92.  Pt has gained 3 lbs since last visit.  Walking regularly.  Denies CP, SOB, abd pain, N/V.  OAB- currently on Myrbetriq 50mg  daily.  Started on Detrol but this was going to be more expensive so she was switched to Myrbetric.  Pt is paying $100/month.  Pt is considering surgery b/c she is having issues w/ incomplete emptying.     Review of Systems For ROS see HPI   This visit occurred during the SARS-CoV-2 public health emergency.  Safety protocols were in place, including screening questions prior to the visit, additional usage of staff PPE, and extensive cleaning of exam room while observing appropriate contact time as indicated for disinfecting solutions.       Objective:   Physical Exam Vitals reviewed.  Constitutional:      General: She is not in acute distress.    Appearance: Normal appearance. She is well-developed. She is obese.  HENT:     Head: Normocephalic and atraumatic.  Eyes:     Conjunctiva/sclera: Conjunctivae normal.     Pupils: Pupils are equal, round, and reactive to light.  Neck:     Thyroid: No thyromegaly.  Cardiovascular:     Rate and Rhythm: Normal rate and regular rhythm.     Pulses: Normal pulses.     Heart sounds: Normal heart sounds. No murmur heard.   Pulmonary:     Effort: Pulmonary effort is normal. No respiratory distress.     Breath sounds: Normal breath sounds.  Abdominal:     General: There is no distension.     Palpations: Abdomen is soft.     Tenderness: There is no abdominal tenderness.  Musculoskeletal:     Cervical back: Normal range of motion and neck supple.     Right lower leg: No edema.     Left lower leg: No edema.  Lymphadenopathy:     Cervical: No cervical adenopathy.    Skin:    General: Skin is warm and dry.  Neurological:     Mental Status: She is alert and oriented to person, place, and time.  Psychiatric:        Behavior: Behavior normal.           Assessment & Plan:

## 2020-06-10 ENCOUNTER — Encounter: Payer: Self-pay | Admitting: Family Medicine

## 2020-06-10 MED ORDER — SOLIFENACIN SUCCINATE 10 MG PO TABS
10.0000 mg | ORAL_TABLET | Freq: Every day | ORAL | 3 refills | Status: DC
Start: 2020-06-10 — End: 2020-06-12

## 2020-06-12 MED ORDER — SOLIFENACIN SUCCINATE 10 MG PO TABS: 10.0000 mg | ORAL_TABLET | Freq: Every day | ORAL | 3 refills | Status: DC

## 2020-06-12 NOTE — Telephone Encounter (Signed)
Patient called back and is still wanting to know about her prescription - Please advise

## 2020-06-22 ENCOUNTER — Encounter: Payer: Self-pay | Admitting: Family Medicine

## 2020-06-23 NOTE — Telephone Encounter (Signed)
Pt is aware of the information about the referral.

## 2020-07-30 ENCOUNTER — Ambulatory Visit
Admission: RE | Admit: 2020-07-30 | Discharge: 2020-07-30 | Disposition: A | Discharge status: Home / Self Care | Payer: Medicare Other | Source: Ambulatory Visit | Attending: Family Medicine | Admitting: Family Medicine

## 2020-07-30 ENCOUNTER — Other Ambulatory Visit: Payer: Self-pay

## 2020-07-30 DIAGNOSIS — Z780 Asymptomatic menopausal state: Secondary | ICD-10-CM

## 2020-08-20 DIAGNOSIS — N3946 Mixed incontinence: Secondary | ICD-10-CM | POA: Diagnosis not present

## 2020-08-20 DIAGNOSIS — N3281 Overactive bladder: Secondary | ICD-10-CM | POA: Diagnosis not present

## 2020-09-09 ENCOUNTER — Telehealth: Payer: Self-pay | Admitting: Family Medicine

## 2020-09-09 DIAGNOSIS — E039 Hypothyroidism, unspecified: Secondary | ICD-10-CM

## 2020-09-09 MED ORDER — LEVOTHYROXINE SODIUM 75 MCG PO TABS: 75.0000 ug | ORAL_TABLET | Freq: Every day | ORAL | 1 refills | Status: DC

## 2020-09-09 MED ORDER — LEVOTHYROXINE SODIUM 75 MCG PO TABS: 75.0000 ug | ORAL_TABLET | Freq: Every day | ORAL | 0 refills | Status: DC

## 2020-09-09 NOTE — Telephone Encounter (Signed)
Pt called in stating that she changed pharmacies. She is now using [facility]. She needs her Levothyroxine 75 MCGs in a 90 day supply to be sent in. She is out of this medication.   Please advise and pt can be reached at the home #

## 2020-09-09 NOTE — Telephone Encounter (Signed)
Rx sent to the preferred patient pharmacy  

## 2020-09-09 NOTE — Addendum Note (Signed)
Addended by: Leonidas Romberg on: 09/09/2020 10:31 AM   Modules accepted: Orders

## 2020-09-09 NOTE — Telephone Encounter (Signed)
Spoke with patient and since she is out of medication. Will send in 14 day supply to her local pharmacy while she is waiting on the 90 day supply from [facility]

## 2020-09-18 DIAGNOSIS — N3281 Overactive bladder: Secondary | ICD-10-CM | POA: Diagnosis not present

## 2020-09-18 DIAGNOSIS — N3946 Mixed incontinence: Secondary | ICD-10-CM | POA: Diagnosis not present

## 2020-09-28 DIAGNOSIS — N3946 Mixed incontinence: Secondary | ICD-10-CM | POA: Diagnosis not present

## 2020-09-28 DIAGNOSIS — N3281 Overactive bladder: Secondary | ICD-10-CM | POA: Diagnosis not present

## 2020-10-06 ENCOUNTER — Other Ambulatory Visit: Payer: Self-pay

## 2020-10-06 ENCOUNTER — Encounter: Payer: Self-pay | Admitting: Family Medicine

## 2020-10-06 DIAGNOSIS — R42 Dizziness and giddiness: Secondary | ICD-10-CM

## 2020-10-06 MED ORDER — MECLIZINE HCL 25 MG PO TABS: 25.0000 mg | ORAL_TABLET | Freq: Three times a day (TID) | ORAL | 0 refills | Status: DC | PRN

## 2020-10-13 ENCOUNTER — Ambulatory Visit (INDEPENDENT_AMBULATORY_CARE_PROVIDER_SITE_OTHER): Payer: Medicare Other | Admitting: Podiatrist

## 2020-10-13 ENCOUNTER — Other Ambulatory Visit: Payer: Self-pay

## 2020-10-13 DIAGNOSIS — L600 Ingrowing nail: Secondary | ICD-10-CM | POA: Diagnosis not present

## 2020-10-13 NOTE — Patient Instructions (Addendum)

## 2020-10-13 NOTE — Progress Notes (Signed)
    Chief Complaint  Karen Jones presents with  . Ingrown Toenail     left foot great toe pain-especially while wearing shoes-outside of the toe     HPI: Karen Jones is 70 y.o. female who presents today for the concerns as listed above. She relates she has an annoying pain on the medial side of the left great toenail.  She has kept it from getting infected but states it is painful in certain shoes.    Karen Jones Active Problem List   Diagnosis Date Noted  . OAB (overactive bladder) 06/08/2020  . Hypothyroidism 04/21/2016  . Obesity (BMI 30-39.9) 04/21/2016  . Varicose veins 03/15/2011    Current Outpatient Medications on File Prior to Visit  Medication Sig Dispense Refill  . ibuprofen (ADVIL,MOTRIN) 600 MG tablet As needed  0  . levothyroxine (SYNTHROID) 75 MCG tablet Take 1 tablet (75 mcg total) by mouth daily with breakfast. 14 tablet 0  . loratadine (CLARITIN) 10 MG tablet Take 10 mg by mouth daily.    . meclizine (ANTIVERT) 25 MG tablet Take 1 tablet (25 mg total) by mouth 3 (three) times daily as needed for dizziness. 30 tablet 0  . Pseudoephedrine-APAP-DM (TYLENOL COLD/FLU SEVERE DAY PO) Take by mouth. (Karen Jones not taking: Reported on 06/08/2020)    . solifenacin (VESICARE) 10 MG tablet Take 1 tablet (10 mg total) by mouth daily. 30 tablet 3   No current facility-administered medications on file prior to visit.    Allergies  Allergen Reactions  . Penicillins Shortness Of Breath and Rash    REACTION: rash, difficulty breathing  . Levofloxacin     Other reaction(s): Myalgias (intolerance)    Review of Systems No fevers, chills, nausea, muscle aches, no difficulty breathing, no calf pain, no chest pain or shortness of breath.   Physical Exam  GENERAL APPEARANCE: Alert, conversant. Appropriately groomed. No acute distress.   VASCULAR: Pedal pulses palpable DP and PT bilateral.  Capillary refill time is immediate to all digits,  Proximal to distal cooling it warm to warm.  Digital  perfusion adequate.   NEUROLOGIC: sensation is intact to 5.07 monofilament at 5/5 sites bilateral.  Light touch is intact bilateral, vibratory sensation intact bilateral  MUSCULOSKELETAL: acceptable muscle strength, tone and stability bilateral.  No gross boney pedal deformities noted.  No pain, crepitus or limitation noted with foot and ankle range of motion bilateral.   DERMATOLOGIC: skin is warm, supple, and dry.  No open lesions noted.  No rash, no pre ulcerative lesions. Incurvated and ingrown hallux nail of the left foot is noted on the medial side. No drainage or inflammation is noted.  Pain with direct pressure is elicited.      Assessment     ICD-10-CM   1. Ingrown toenail of left foot  L60.0      Plan  Treatment options and alternatives were discussed. Recommended a permanent removal of the medial nail border of the Left hallux nail.  Karen Jones agreed. Skin was prepped with alcohol and a local injection of lidocaine and Marcaine plain was infiltrated to anesthetize the toe. The toe was then prepped with Betadine exsanguinated. The offending nail border is removed and phenol applied.  It was cleansed well with alcohol. Antibiotic ointment and a dressing was then applied and the Karen Jones was given instructions for aftercare. She will return in 1-2 weeks for a nail check and will call sooner if concerns or any signs of infection arise.

## 2020-10-15 ENCOUNTER — Encounter: Payer: Self-pay | Admitting: Podiatrist

## 2020-10-16 ENCOUNTER — Telehealth: Payer: Self-pay | Admitting: Podiatrist

## 2020-10-16 NOTE — Telephone Encounter (Signed)
Pt called to confirm the amount of days she has to soak and bandage her toe for. I called pt and she expressed understanding. She will be back on 4/5 for a nail check.

## 2020-10-19 ENCOUNTER — Telehealth: Payer: Self-pay

## 2020-10-19 MED ORDER — MUPIROCIN 2 % EX OINT
1.0000 | TOPICAL_OINTMENT | Freq: Two times a day (BID) | CUTANEOUS | 2 refills | Status: DC
Start: 2020-10-19 — End: 2021-06-09

## 2020-10-19 MED ORDER — CLINDAMYCIN HCL 300 MG PO CAPS
300.0000 mg | ORAL_CAPSULE | Freq: Three times a day (TID) | ORAL | 0 refills | Status: DC
Start: 2020-10-19 — End: 2021-05-07

## 2020-10-19 NOTE — Telephone Encounter (Signed)
Pt had ingrown removed last Tuesday. Pt is now in pain and her toe has tons of redness. The pt isn't sure if its infected or not.

## 2020-10-19 NOTE — Telephone Encounter (Signed)
I will call her in an antibiotic -  keely-  would you mind calling to let her know?  Also I'm happy to see her Thursday or Friday when I'm at the office or she can see someone else sooner if she needs to-    Thanks a bunch!

## 2020-10-21 ENCOUNTER — Telehealth: Payer: Self-pay | Admitting: *Deleted

## 2020-10-21 NOTE — Telephone Encounter (Addendum)
Patient is having problems with the toenail  that was removed 1 week ago, started to bother her 4 days ago. She also wants to know if she can use antibiotic soaps instead of epsom salts. Please advise.

## 2020-10-22 NOTE — Telephone Encounter (Signed)
Spoke with husband and explained message per Dr Valentina Lucks and he verbalized understanding and will give message to wife.

## 2020-10-22 NOTE — Telephone Encounter (Signed)
Would you mind letting her know it's fine to switch soaks-  antibiotic soap and water will be great.   Also-  if she can come in tomorrow I am happy to see her to have a look at the toe-    Thanks Ammie!

## 2020-10-22 NOTE — Telephone Encounter (Signed)
Called,no answer and could not leave Vmessage, will try back later

## 2020-10-27 ENCOUNTER — Ambulatory Visit (INDEPENDENT_AMBULATORY_CARE_PROVIDER_SITE_OTHER): Payer: Medicare Other | Admitting: Podiatrist

## 2020-10-27 ENCOUNTER — Encounter: Payer: Self-pay | Admitting: Podiatrist

## 2020-10-27 ENCOUNTER — Other Ambulatory Visit: Payer: Self-pay

## 2020-10-27 DIAGNOSIS — L600 Ingrowing nail: Secondary | ICD-10-CM

## 2020-10-27 NOTE — Progress Notes (Signed)
Chief Complaint  Patient presents with  . nail check    Nail check      HPI: Patient is 70 y.o. female who presents today for recheck of the left great toenail status post permanent phenol matrixectomy on the medial border. The patient states the toe was doing well but then became sore and infected looking last Sunday.  She was put on antibiotics (clindamycin) and mupirocin ointment last Monday.  She states the toe is improving but still painful.      Allergies  Allergen Reactions  . Penicillins Shortness Of Breath and Rash    REACTION: rash, difficulty breathing  . Levofloxacin     Other reaction(s): Myalgias (intolerance)    Review of systems is reviewed and negative.   Physical Exam  Patient is awake, alert, and oriented x 3.  In no acute distress.    Vascular status is intact with palpable pedal pulses DP and PT bilateral and capillary refill time less than 3 seconds bilateral.  No edema or erythema noted.  Neurological exam reveals epicritic and protective sensation grossly intact bilateral.  Dermatological exam reveals skin is supple and dry to bilateral feet.  No open lesions present.   Musculoskeletal exam: Musculature intact with dorsiflexion, plantarflexion, inversion, eversion. Ankle and First MPJ joint range of motion normal.   Medial aspect of the left great toenail appears to be healing well.  Small scab is present and uncomfortable when pressed.  She also has a trace amount of redness along the proximal nail fold-  Appears the infection is improving.  No pus or drainage expressed.  No malodor or swelling in the toe noted.    Assessment: Follow up visit for ingrown toenail removal left foot-  Medial aspect of hallux  Plan: Examined the patient and the toe.  It appears to be healing well.  I recommended she use peroxide on the scab area of the toe as well as continuing with the epsom salt soaks for 4 more days.  She will also continue using the mupirocin ointment on  the toe for the next week to 2 weeks.  If she still has pain she will call.  She will also finish the antibiotics.

## 2020-11-11 ENCOUNTER — Telehealth: Payer: Self-pay | Admitting: *Deleted

## 2020-11-11 NOTE — Telephone Encounter (Signed)
Patient is calling with concerns about her toe that is still sore after ingrown (10/13/20)several weeks ago. She was told to soak the toe for 4 more days, apply the ointment for 2 weeks but no improvement. Please advise.

## 2020-11-12 NOTE — Telephone Encounter (Signed)
Called and spoke with husband and gave instructions per Dr Valentina Lucks and to call to schedule  F/u appointment.

## 2020-11-19 DIAGNOSIS — N3941 Urge incontinence: Secondary | ICD-10-CM | POA: Diagnosis not present

## 2020-11-20 ENCOUNTER — Ambulatory Visit (INDEPENDENT_AMBULATORY_CARE_PROVIDER_SITE_OTHER): Payer: Medicare Other | Admitting: Podiatrist

## 2020-11-20 ENCOUNTER — Encounter: Payer: Self-pay | Admitting: Podiatrist

## 2020-11-20 ENCOUNTER — Other Ambulatory Visit: Payer: Self-pay

## 2020-11-20 DIAGNOSIS — L600 Ingrowing nail: Secondary | ICD-10-CM

## 2020-11-20 MED ORDER — DOXYCYCLINE HYCLATE 100 MG PO TABS: 100.0000 mg | ORAL_TABLET | Freq: Two times a day (BID) | ORAL | 0 refills | Status: DC

## 2020-11-20 NOTE — Progress Notes (Signed)
Chief Complaint  Patient presents with  . Nail Problem    F/u today after ingrown left great toenail removal and continuing soreness/redness. Reports w/out shoes pain lvl 3/10 and w/ shoes pain lvl 5/10. Denies f/c/n/v     HPI: Patient is 70 y.o. female who presents today for follow-up of the medial border of the left great toenail which was removed on 10/13/2020.  The nail has not been healing despite her doing all that has been instructed of her to do- including soaking, bandaid, mupirocin ointment. She still has redness and discomfort along the medial nail border and the tip of the toe.    Allergies  Allergen Reactions  . Penicillins Shortness Of Breath and Rash    REACTION: rash, difficulty breathing  . Levofloxacin     Other reaction(s): Myalgias (intolerance)    Review of systems is reviewed and negative.   Physical Exam  Patient is awake, alert, and oriented x 3.  In no acute distress.    Vascular status is intact with palpable pedal pulses DP and PT bilateral and capillary refill time less than 3 seconds bilateral.  No edema or erythema noted.  Neurological exam reveals epicritic and protective sensation grossly intact bilateral.  Dermatological exam reveals skin is supple and dry to bilateral feet.  No open lesions present.   Musculoskeletal exam: Musculature intact with dorsiflexion, plantarflexion, inversion, eversion. Ankle and First MPJ joint range of motion normal.   Left hallux medial nail border does continue to be red and painful with pressure along the proximal and medial and distal edge of the nail.  There is also redness along the nail fold on the left hallux in comparison to the right.  Concern for a remaining nail spicule is present.  Assessment:   ICD-10-CM   1. Ingrown toenail of left foot  L60.0      Plan: Treatment options were discussed with the patient.  I did discuss that I was concerned that there may be a remaining nail spicule present that is causing  the discomfort in the proximal nail fold and causing the redness and swelling.  I recommended a incision and drainage to determine if there is still a small nail spicule present.  The patient agreed to prepped the skin with alcohol and infiltrated lidocaine and Marcaine mix into the toe in a digital block fashion.  The patient tolerated this well.  After the toe was anesthetized it was prepped with Betadine and exsanguinated.  A portion of the medial nail border was then resected and removed.  Upon further evaluation a small area of nail root was encountered in the most medial portion of the nail.  This was also removed.  I did apply more phenol to this area and cleansed with alcohol wash well.  Silvadene cream was then applied and a dry and sterile compressive dressing was applied.  Patient will continue soaking twice a day and utilize mupirocin ointment.  I also called in doxycycline for her to start taking as well as a preventative.  She will be seen back in 1 week for a nail check she will call prior to that visit if any problems arise.

## 2020-11-30 ENCOUNTER — Other Ambulatory Visit: Payer: Self-pay

## 2020-11-30 ENCOUNTER — Other Ambulatory Visit: Payer: Self-pay | Admitting: Family Medicine

## 2020-11-30 ENCOUNTER — Ambulatory Visit (INDEPENDENT_AMBULATORY_CARE_PROVIDER_SITE_OTHER): Payer: Medicare Other | Admitting: Podiatry

## 2020-11-30 ENCOUNTER — Encounter: Payer: Self-pay | Admitting: Podiatry

## 2020-11-30 DIAGNOSIS — Z1231 Encounter for screening mammogram for malignant neoplasm of breast: Secondary | ICD-10-CM

## 2020-11-30 DIAGNOSIS — L03032 Cellulitis of left toe: Secondary | ICD-10-CM

## 2020-12-02 NOTE — Progress Notes (Signed)
Subjective:   Patient ID: Karen Jones, female   DOB: 70 y.o.   MRN: [id number]   HPI Patient states improved but wanted this toe checked again with light redness    ROS      Objective:  Physical Exam  Neurovascular status intact with patient's left hallux showing slight redness crusted tissue medial side localized to this area     Assessment:  Mild paronychia left hallux localized     Plan:  H&P reduced and reviewed condition debrided out the tissue recommended soaks and that this should heal uneventfully.  If any increased redness or pathology were to occur patient is to let us know immediately

## 2020-12-03 DIAGNOSIS — N3281 Overactive bladder: Secondary | ICD-10-CM | POA: Diagnosis not present

## 2020-12-03 DIAGNOSIS — N3946 Mixed incontinence: Secondary | ICD-10-CM | POA: Diagnosis not present

## 2020-12-17 ENCOUNTER — Other Ambulatory Visit: Payer: Self-pay

## 2020-12-17 ENCOUNTER — Telehealth: Payer: Self-pay

## 2020-12-17 DIAGNOSIS — E039 Hypothyroidism, unspecified: Secondary | ICD-10-CM

## 2020-12-17 MED ORDER — LEVOTHYROXINE SODIUM 75 MCG PO TABS: 75.0000 ug | ORAL_TABLET | Freq: Every day | ORAL | 0 refills | Status: DC

## 2020-12-17 NOTE — Telephone Encounter (Signed)
Patient is calling in stating that she put a request in for levothyroxine (SYNTHROID) 75 MCG tablet to [facility]. When she received the last refill it was a generic -Synthorid, and she called [facility] asking if they can get Levothyroxine for her next time. When she received a call about an upcoming refill request they told her they were sending the generic Synthorid to her but [place] asked them to call us to have levothyroxine sent to them but has not hear anything and patient is almost out. Asking if we can send a 90 day supply to [id number] - [place], [place] - [address]

## 2020-12-17 NOTE — Telephone Encounter (Signed)
Medication sent to patient's pharmacy. Patient notified.

## 2021-01-20 ENCOUNTER — Encounter: Payer: Self-pay | Admitting: *Deleted

## 2021-01-22 ENCOUNTER — Ambulatory Visit
Admission: RE | Admit: 2021-01-22 | Discharge: 2021-01-22 | Disposition: A | Discharge status: Home / Self Care | Payer: Medicare Other | Source: Ambulatory Visit | Attending: Family Medicine | Admitting: Family Medicine

## 2021-01-22 ENCOUNTER — Other Ambulatory Visit: Payer: Self-pay

## 2021-01-22 DIAGNOSIS — Z1231 Encounter for screening mammogram for malignant neoplasm of breast: Secondary | ICD-10-CM | POA: Diagnosis not present

## 2021-02-08 ENCOUNTER — Encounter: Payer: Self-pay | Admitting: Family Medicine

## 2021-03-02 DIAGNOSIS — Z23 Encounter for immunization: Secondary | ICD-10-CM | POA: Diagnosis not present

## 2021-03-04 DIAGNOSIS — H9201 Otalgia, right ear: Secondary | ICD-10-CM | POA: Diagnosis not present

## 2021-03-04 DIAGNOSIS — Z9622 Myringotomy tube(s) status: Secondary | ICD-10-CM | POA: Diagnosis not present

## 2021-03-04 DIAGNOSIS — H6981 Other specified disorders of Eustachian tube, right ear: Secondary | ICD-10-CM | POA: Diagnosis not present

## 2021-03-09 ENCOUNTER — Other Ambulatory Visit: Payer: Self-pay | Admitting: Family Medicine

## 2021-03-09 DIAGNOSIS — E039 Hypothyroidism, unspecified: Secondary | ICD-10-CM

## 2021-03-11 ENCOUNTER — Telehealth: Payer: Self-pay | Admitting: Family Medicine

## 2021-03-11 NOTE — Chronic Care Management (AMB) (Signed)
  Chronic Care Management   Outreach Note  03/11/2021 Name: Karen Jones MRN: [id number] DOB: 16-Oct-1950  Referred by: Midge Minium, MD Reason for referral : No chief complaint on file.   An unsuccessful telephone outreach was attempted today. The patient was referred to the pharmacist for assistance with care management and care coordination.   Follow Up Plan:   Tatjana Dellinger Upstream Scheduler

## 2021-03-15 ENCOUNTER — Telehealth: Payer: Self-pay | Admitting: Family Medicine

## 2021-03-15 NOTE — Progress Notes (Signed)
  Chronic Care Management   Note  03/15/2021 Name: Karen Jones MRN: [id number] DOB: [birth year]  Karen Jones is a 70 y.o. year old female who is a primary care patient of Birdie Riddle, Aundra Millet, MD. I reached out to [name] by phone today in response to a referral sent by Karen Jones's PCP, Midge Minium, MD.   Karen Jones was given information about Chronic Care Management services today including:  CCM service includes personalized support from designated clinical staff supervised by her physician, including individualized plan of care and coordination with other care providers 24/7 contact phone numbers for assistance for urgent and routine care needs. Service will only be billed when office clinical staff spend 20 minutes or more in a month to coordinate care. Only one practitioner may furnish and bill the service in a calendar month. The patient may stop CCM services at any time (effective at the end of the month) by phone call to the office staff.   Patient agreed to services and verbal consent obtained.   Follow up plan:   Tatjana Secretary/administrator

## 2021-05-07 ENCOUNTER — Telehealth: Payer: Medicare Other | Admitting: Physician Assistant

## 2021-05-07 DIAGNOSIS — J40 Bronchitis, not specified as acute or chronic: Secondary | ICD-10-CM

## 2021-05-07 DIAGNOSIS — J0140 Acute pansinusitis, unspecified: Secondary | ICD-10-CM

## 2021-05-07 MED ORDER — BENZONATATE 100 MG PO CAPS: 100.0000 mg | ORAL_CAPSULE | Freq: Three times a day (TID) | ORAL | 0 refills | Status: DC | PRN

## 2021-05-07 MED ORDER — PREDNISONE 20 MG PO TABS: 40.0000 mg | ORAL_TABLET | Freq: Every day | ORAL | 0 refills | Status: DC

## 2021-05-07 MED ORDER — PSEUDOEPH-BROMPHEN-DM 30-2-10 MG/5ML PO SYRP: 5.0000 mL | ORAL_SOLUTION | Freq: Four times a day (QID) | ORAL | 0 refills | Status: DC | PRN

## 2021-05-07 MED ORDER — ALBUTEROL SULFATE HFA 108 (90 BASE) MCG/ACT IN AERS: 2.0000 | INHALATION_SPRAY | Freq: Four times a day (QID) | RESPIRATORY_TRACT | 0 refills | Status: DC | PRN

## 2021-05-07 MED ORDER — AZITHROMYCIN 250 MG PO TABS: ORAL_TABLET | ORAL | 0 refills | Status: DC

## 2021-05-07 NOTE — Patient Instructions (Signed)
[name], thank you for joining Mar Daring, PA-C for today's virtual visit.  While this provider is not your primary care provider (PCP), if your PCP is located in our provider database this encounter information will be shared with them immediately following your visit.  Consent: (Patient) [name] provided verbal consent for this virtual visit at the beginning of the encounter.  Current Medications:  Current Outpatient Medications:    albuterol (VENTOLIN HFA) 108 (90 Base) MCG/ACT inhaler, Inhale 2 puffs into the lungs every 6 (six) hours as needed for wheezing or shortness of breath., Disp: 8 g, Rfl: 0   azithromycin (ZITHROMAX) 250 MG tablet, Take 2 tablets PO on day one, and one tablet PO daily thereafter until completed., Disp: 6 tablet, Rfl: 0   benzonatate (TESSALON) 100 MG capsule, Take 1 capsule (100 mg total) by mouth 3 (three) times daily as needed., Disp: 30 capsule, Rfl: 0   brompheniramine-pseudoephedrine-DM 30-2-10 MG/5ML syrup, Take 5 mLs by mouth 4 (four) times daily as needed., Disp: 120 mL, Rfl: 0   predniSONE (DELTASONE) 20 MG tablet, Take 2 tablets (40 mg total) by mouth daily with breakfast., Disp: 10 tablet, Rfl: 0   ibuprofen (ADVIL,MOTRIN) 600 MG tablet, As needed, Disp: , Rfl: 0   levothyroxine (SYNTHROID) 75 MCG tablet, TAKE 1 TABLET (75 MCG TOTAL) BY MOUTH DAILY WITH BREAKFAST., Disp: 90 tablet, Rfl: 0   loratadine (CLARITIN) 10 MG tablet, Take 10 mg by mouth daily., Disp: , Rfl:    meclizine (ANTIVERT) 25 MG tablet, Take 1 tablet (25 mg total) by mouth 3 (three) times daily as needed for dizziness., Disp: 30 tablet, Rfl: 0   mupirocin ointment (BACTROBAN) 2 %, Apply 1 application topically 2 (two) times daily., Disp: 30 g, Rfl: 2   MYRBETRIQ 50 MG TB24 tablet, , Disp: , Rfl:    solifenacin (VESICARE) 10 MG tablet, Take 1 tablet (10 mg total) by mouth daily., Disp: 30 tablet, Rfl: 3   Medications ordered in this encounter:  Meds  ordered this encounter  Medications   azithromycin (ZITHROMAX) 250 MG tablet    Sig: Take 2 tablets PO on day one, and one tablet PO daily thereafter until completed.    Dispense:  6 tablet    Refill:  0    Order Specific Question:   Supervising Provider    Answer:   Crespo, BRIAN [[id number]]   brompheniramine-pseudoephedrine-DM 30-2-10 MG/5ML syrup    Sig: Take 5 mLs by mouth 4 (four) times daily as needed.    Dispense:  120 mL    Refill:  0    Order Specific Question:   Supervising Provider    Answer:   Vogelsang, BRIAN [[id number]]   benzonatate (TESSALON) 100 MG capsule    Sig: Take 1 capsule (100 mg total) by mouth 3 (three) times daily as needed.    Dispense:  30 capsule    Refill:  0    Order Specific Question:   Supervising Provider    Answer:   Zirbel, BRIAN [[id number]]   predniSONE (DELTASONE) 20 MG tablet    Sig: Take 2 tablets (40 mg total) by mouth daily with breakfast.    Dispense:  10 tablet    Refill:  0    Order Specific Question:   Supervising Provider    Answer:   Jhaveri, BRIAN [[id number]]   albuterol (VENTOLIN HFA) 108 (90 Base) MCG/ACT inhaler    Sig: Inhale 2 puffs into the lungs every 6 (six) hours  as needed for wheezing or shortness of breath.    Dispense:  8 g    Refill:  0    Order Specific Question:   Supervising Provider    Answer:   Sabra Heck, Mackey     *If you need refills on other medications prior to your next appointment, please contact your pharmacy*  Follow-Up: Call back or seek an in-person evaluation if the symptoms worsen or if the condition fails to improve as anticipated.  Other Instructions Sinusitis, Adult Sinusitis is inflammation of your sinuses. Sinuses are hollow spaces in the bones around your face. Your sinuses are located: Around your eyes. In the middle of your forehead. Behind your nose. In your cheekbones. Mucus normally drains out of your sinuses. When your nasal tissues become inflamed or swollen, mucus can become trapped or blocked.  This allows bacteria, viruses, and fungi to grow, which leads to infection. Most infections of the sinuses are caused by a virus. Sinusitis can develop quickly. It can last for up to 4 weeks (acute) or for more than 12 weeks (chronic). Sinusitis often develops after a cold. What are the causes? This condition is caused by anything that creates swelling in the sinuses or stops mucus from draining. This includes: Allergies. Asthma. Infection from bacteria or viruses. Deformities or blockages in your nose or sinuses. Abnormal growths in the nose (nasal polyps). Pollutants, such as chemicals or irritants in the air. Infection from fungi (rare). What increases the risk? You are more likely to develop this condition if you: Have a weak body defense system (immune system). Do a lot of swimming or diving. Overuse nasal sprays. Smoke. What are the signs or symptoms? The main symptoms of this condition are pain and a feeling of pressure around the affected sinuses. Other symptoms include: Stuffy nose or congestion. Thick drainage from your nose. Swelling and warmth over the affected sinuses. Headache. Upper toothache. A cough that may get worse at night. Extra mucus that collects in the throat or the back of the nose (postnasal drip). Decreased sense of smell and taste. Fatigue. A fever. Sore throat. Bad breath. How is this diagnosed? This condition is diagnosed based on: Your symptoms. Your medical history. A physical exam. Tests to find out if your condition is acute or chronic. This may include: Checking your nose for nasal polyps. Viewing your sinuses using a device that has a light (endoscope). Testing for allergies or bacteria. Imaging tests, such as an MRI or CT scan. In rare cases, a bone biopsy may be done to rule out more serious types of fungal sinus disease. How is this treated? Treatment for sinusitis depends on the cause and whether your condition is chronic or  acute. If caused by a virus, your symptoms should go away on their own within 10 days. You may be given medicines to relieve symptoms. They include: Medicines that shrink swollen nasal passages (topical intranasal decongestants). Medicines that treat allergies (antihistamines). A spray that eases inflammation of the nostrils (topical intranasal corticosteroids). Rinses that help get rid of thick mucus in your nose (nasal saline washes). If caused by bacteria, your health care provider may recommend waiting to see if your symptoms improve. Most bacterial infections will get better without antibiotic medicine. You may be given antibiotics if you have: A severe infection. A weak immune system. If caused by narrow nasal passages or nasal polyps, you may need to have surgery. Follow these instructions at home: Medicines Take, use, or apply over-the-counter and prescription  medicines only as told by your health care provider. These may include nasal sprays. If you were prescribed an antibiotic medicine, take it as told by your health care provider. Do not stop taking the antibiotic even if you start to feel better. Hydrate and humidify  Drink enough fluid to keep your urine pale yellow. Staying hydrated will help to thin your mucus. Use a cool mist humidifier to keep the humidity level in your home above 50%. Inhale steam for 10-15 minutes, 3-4 times a day, or as told by your health care provider. You can do this in the bathroom while a hot shower is running. Limit your exposure to cool or dry air. Rest Rest as much as possible. Sleep with your head raised (elevated). Make sure you get enough sleep each night. General instructions  Apply a warm, moist washcloth to your face 3-4 times a day or as told by your health care provider. This will help with discomfort. Wash your hands often with soap and water to reduce your exposure to germs. If soap and water are not available, use hand sanitizer. Do  not smoke. Avoid being around people who are smoking (secondhand smoke). Keep all follow-up visits as told by your health care provider. This is important. Contact a health care provider if: You have a fever. Your symptoms get worse. Your symptoms do not improve within 10 days. Get help right away if: You have a severe headache. You have persistent vomiting. You have severe pain or swelling around your face or eyes. You have vision problems. You develop confusion. Your neck is stiff. You have trouble breathing. Summary Sinusitis is soreness and inflammation of your sinuses. Sinuses are hollow spaces in the bones around your face. This condition is caused by nasal tissues that become inflamed or swollen. The swelling traps or blocks the flow of mucus. This allows bacteria, viruses, and fungi to grow, which leads to infection. If you were prescribed an antibiotic medicine, take it as told by your health care provider. Do not stop taking the antibiotic even if you start to feel better. Keep all follow-up visits as told by your health care provider. This is important. This information is not intended to replace advice given to you by your health care provider. Make sure you discuss any questions you have with your health care provider. Document Revised: 12/11/2017 Document Reviewed: 12/11/2017 Elsevier Patient Education  2022 [place].   Acute Bronchitis, Adult Acute bronchitis is sudden or acute swelling of the air tubes (bronchi) in the lungs. Acute bronchitis causes these tubes to fill with mucus, which can make it hard to breathe. It can also cause coughing or wheezing. In adults, acute bronchitis usually goes away within 2 weeks. A cough caused by bronchitis may last up to 3 weeks. Smoking, allergies, and asthma can make the condition worse. What are the causes? This condition can be caused by germs and by substances that irritate the lungs, including: Cold and flu viruses. The  most common cause of this condition is the virus that causes the common cold. Bacteria. Substances that irritate the lungs, including: Smoke from cigarettes and other forms of tobacco. Dust and pollen. Fumes from chemical products, gases, or burned fuel. Other materials that pollute indoor or outdoor air. Close contact with someone who has acute bronchitis. What increases the risk? The following factors may make you more likely to develop this condition: A weak body's defense system, also called the immune system. A condition that affects your  lungs and breathing, such as asthma. What are the signs or symptoms? Common symptoms of this condition include: Lung and breathing problems, such as: Coughing. This may bring up clear, yellow, or green mucus from your lungs (sputum). Wheezing. Having too much mucus in your lungs (chest congestion). Having shortness of breath. A fever. Chills. Aches and pains, including: Tightness in your chest and other body aches. A sore throat. How is this diagnosed? This condition is usually diagnosed based on: Your symptoms and medical history. A physical exam. You may also have other tests, including tests to rule out other conditions, such as pneumonia. These tests include: A test of lung function. Test of a mucus sample to look for the presence of bacteria. Tests to check the oxygen level in your blood. Blood tests. Chest X-ray. How is this treated? Most cases of acute bronchitis clear up over time without treatment. Your health care provider may recommend: Drinking more fluids. This can thin your mucus, which may improve your breathing. Using a device that gets medicine into your lungs (inhaler) to help improve breathing and control coughing. Using a vaporizer or a humidifier. These are machines that add water to the air to help you breathe better. Taking a medicine for a fever. Taking a medicine that thins mucus and clears congestion  (expectorant). Taking a medicine that prevents or stops coughing (cough suppressant). Follow these instructions at home: Activity Get plenty of rest. Return to your normal activities as told by your health care provider. Ask your health care provider what activities are safe for you. Lifestyle  Drink enough fluid to keep your urine pale yellow. Do not drink alcohol. Do not use any products that contain nicotine or tobacco, such as cigarettes, e-cigarettes, and chewing tobacco. If you need help quitting, ask your health care provider. Be aware that: Your bronchitis will get worse if you smoke or breathe in other people's smoke (secondhand smoke). Your lungs will heal faster if you quit smoking. General instructions Take over-the-counter and prescription medicines only as told by your health care provider. Use an inhaler, vaporizer, or humidifier as told by your health care provider. If you have a sore throat, gargle with a salt-water mixture 3-4 times a day or as needed. To make a salt-water mixture, completely dissolve -1 tsp (3-6 g) of salt in 1 cup (237 mL) of warm water. Take two teaspoons of honey at bedtime to lessen coughing at night. Keep all follow-up visits as told by your health care provider. This is important. How is this prevented? To lower your risk of getting this condition again: Wash your hands often with soap and water. If soap and water are not available, use hand sanitizer. Avoid contact with people who have cold symptoms. Try not to touch your mouth, nose, or eyes with your hands. Avoid places where there are fumes from chemicals. Breathing these fumes will make your condition worse. Get the flu shot every year. Contact a health care provider if: Your symptoms do not improve after 2 weeks of treatment. You vomit more than once or twice. You have symptoms of dehydration such as: Dark urine. Dry skin or eyes. Increased thirst. Headaches. Confusion. Muscle  cramps. Get help right away if you: Cough up blood. Feel pain in your chest. Have severe shortness of breath. Faint or keep feeling like you are going to faint. Have a severe headache. Have fever or chills that get worse. These symptoms may represent a serious problem that is an emergency. Do  not wait to see if the symptoms will go away. Get medical help right away. Call your local emergency services (911 in the [place] Do not drive yourself to the hospital. Summary Acute bronchitis is sudden (acute) inflammation of the air tubes (bronchi) between the windpipe and the lungs. In adults, acute bronchitis usually goes away within 2 weeks, although coughing may last 3 weeks or longer. Take over-the-counter and prescription medicines only as told by your health care provider. Drink enough fluid to keep your urine pale yellow. Contact a health care provider if your symptoms do not improve after 2 weeks of treatment. Get help right away if you cough up blood, faint, or have chest pain or shortness of breath. This information is not intended to replace advice given to you by your health care provider. Make sure you discuss any questions you have with your health care provider. Document Revised: 06/10/2020 Document Reviewed: 02/01/2019 Elsevier Patient Education  2022 [employer].    If you have been instructed to have an in-person evaluation today at a local Urgent Care facility, please use the link below. It will take you to a list of all of our available [facility], including address, phone number and hours of operation. Please do not delay care.  [facility]  If you or a family member do not have a primary care provider, use the link below to schedule a visit and establish care. When you choose a [facility] primary care physician or advanced practice provider, you gain a long-term partner in health. Find a Primary Care Provider  Learn more about [facility]'s  in-office and virtual care options: [facility]

## 2021-05-07 NOTE — Progress Notes (Signed)
Virtual Visit Consent   Karen Jones, you are scheduled for a virtual visit with a [facility] provider today.     Just as with appointments in the office, your consent must be obtained to participate.  Your consent will be active for this visit and any virtual visit you may have with one of our providers in the next 365 days.     If you have a MyChart account, a copy of this consent can be sent to you electronically.  All virtual visits are billed to your insurance company just like a traditional visit in the office.    As this is a virtual visit, video technology does not allow for your provider to perform a traditional examination.  This may limit your provider's ability to fully assess your condition.  If your provider identifies any concerns that need to be evaluated in person or the need to arrange testing (such as labs, EKG, etc.), we will make arrangements to do so.     Although advances in technology are sophisticated, we cannot ensure that it will always work on either your end or our end.  If the connection with a video visit is poor, the visit may have to be switched to a telephone visit.  With either a video or telephone visit, we are not always able to ensure that we have a secure connection.     I need to obtain your verbal consent now.   Are you willing to proceed with your visit today?    Karen Jones has provided verbal consent on 05/07/2021 for a virtual visit (video or telephone).   Karen Daring, PA-C   Date: 05/07/2021 4:42 PM   Virtual Visit via Video Note   I, Karen Jones, connected with  Karen Jones  ([id number], 1951/01/10) on 05/07/21 at  4:15 PM EDT by a video-enabled telemedicine application and verified that I am speaking with the correct person using two identifiers.  Location: Patient: Virtual Visit Location Patient: Home Provider: Virtual Visit Location Provider: Home Office   I discussed the limitations of evaluation  and management by telemedicine and the availability of in person appointments. The patient expressed understanding and agreed to proceed.    History of Present Illness: Karen Jones is a 70 y.o. who identifies as a female who was assigned female at birth, and is being seen today for URI symptoms.  HPI: URI  This is a new problem. The current episode started 1 to 4 weeks ago. The problem has been gradually worsening. Associated symptoms include congestion, coughing, headaches, sinus pain, sneezing and a sore throat. Associated symptoms comments: Hoarse voice, hears crackling in chest. She has tried acetaminophen, antihistamine, sleep, steam, NSAIDs, increased fluids and decongestant (nasal spray) for the symptoms. The treatment provided mild relief.   Recent travel to [place] and [place], got sick while there over a week ago. Symptoms have been coming and going.  Problems:  Patient Active Problem List   Diagnosis Date Noted   OAB (overactive bladder) 06/08/2020   Hypothyroidism 04/21/2016   Obesity (BMI 30-39.9) 04/21/2016   Varicose veins 03/15/2011    Allergies:  Allergies  Allergen Reactions   Penicillins Shortness Of Breath and Rash    REACTION: rash, difficulty breathing   Levofloxacin     Other reaction(s): Myalgias (intolerance)   Medications:  Current Outpatient Medications:    albuterol (VENTOLIN HFA) 108 (90 Base) MCG/ACT inhaler, Inhale 2 puffs into the lungs every 6 (six)  hours as needed for wheezing or shortness of breath., Disp: 8 g, Rfl: 0   azithromycin (ZITHROMAX) 250 MG tablet, Take 2 tablets PO on day one, and one tablet PO daily thereafter until completed., Disp: 6 tablet, Rfl: 0   benzonatate (TESSALON) 100 MG capsule, Take 1 capsule (100 mg total) by mouth 3 (three) times daily as needed., Disp: 30 capsule, Rfl: 0   brompheniramine-pseudoephedrine-DM 30-2-10 MG/5ML syrup, Take 5 mLs by mouth 4 (four) times daily as needed., Disp: 120 mL, Rfl: 0    predniSONE (DELTASONE) 20 MG tablet, Take 2 tablets (40 mg total) by mouth daily with breakfast., Disp: 10 tablet, Rfl: 0   ibuprofen (ADVIL,MOTRIN) 600 MG tablet, As needed, Disp: , Rfl: 0   levothyroxine (SYNTHROID) 75 MCG tablet, TAKE 1 TABLET (75 MCG TOTAL) BY MOUTH DAILY WITH BREAKFAST., Disp: 90 tablet, Rfl: 0   loratadine (CLARITIN) 10 MG tablet, Take 10 mg by mouth daily., Disp: , Rfl:    meclizine (ANTIVERT) 25 MG tablet, Take 1 tablet (25 mg total) by mouth 3 (three) times daily as needed for dizziness., Disp: 30 tablet, Rfl: 0   mupirocin ointment (BACTROBAN) 2 %, Apply 1 application topically 2 (two) times daily., Disp: 30 g, Rfl: 2   MYRBETRIQ 50 MG TB24 tablet, , Disp: , Rfl:    solifenacin (VESICARE) 10 MG tablet, Take 1 tablet (10 mg total) by mouth daily., Disp: 30 tablet, Rfl: 3  Observations/Objective: Patient is well-developed, well-nourished in no acute distress.  Resting comfortably at home.  Head is normocephalic, atraumatic.  No labored breathing.  Speech is clear and coherent with logical content.  Patient is alert and oriented at baseline.  Dry, deep, harsh cough heard multiple times  Assessment and Plan: 1. Acute non-recurrent pansinusitis - azithromycin (ZITHROMAX) 250 MG tablet; Take 2 tablets PO on day one, and one tablet PO daily thereafter until completed.  Dispense: 6 tablet; Refill: 0 - predniSONE (DELTASONE) 20 MG tablet; Take 2 tablets (40 mg total) by mouth daily with breakfast.  Dispense: 10 tablet; Refill: 0  2. Bronchitis - azithromycin (ZITHROMAX) 250 MG tablet; Take 2 tablets PO on day one, and one tablet PO daily thereafter until completed.  Dispense: 6 tablet; Refill: 0 - brompheniramine-pseudoephedrine-DM 30-2-10 MG/5ML syrup; Take 5 mLs by mouth 4 (four) times daily as needed.  Dispense: 120 mL; Refill: 0 - benzonatate (TESSALON) 100 MG capsule; Take 1 capsule (100 mg total) by mouth 3 (three) times daily as needed.  Dispense: 30 capsule;  Refill: 0 - predniSONE (DELTASONE) 20 MG tablet; Take 2 tablets (40 mg total) by mouth daily with breakfast.  Dispense: 10 tablet; Refill: 0 - albuterol (VENTOLIN HFA) 108 (90 Base) MCG/ACT inhaler; Inhale 2 puffs into the lungs every 6 (six) hours as needed for wheezing or shortness of breath.  Dispense: 8 g; Refill: 0  - Worsening symptoms that have not responded to OTC medications.  - Will give zpak, cover atypical bacteria  - Prednisone and albuterol for chest tightness and adventitious lung sounds (per patient) - Bromfed Dm and tessalon for cough - Ibuprofen and tylenol for fevers and aches  - Continue allergy medications.  - Stay well hydrated and get plenty of rest.  - Call or seek in person evaluation if no symptom improvement or if symptoms worsen.  Follow Up Instructions: I discussed the assessment and treatment plan with the patient. The patient was provided an opportunity to ask questions and all were answered. The patient agreed with the  plan and demonstrated an understanding of the instructions.  A copy of instructions were sent to the patient via MyChart unless otherwise noted below.    The patient was advised to call back or seek an in-person evaluation if the symptoms worsen or if the condition fails to improve as anticipated.  Time:  I spent 15 minutes with the patient via telehealth technology discussing the above problems/concerns.    Karen Daring, PA-C

## 2021-05-14 ENCOUNTER — Encounter: Payer: Self-pay | Admitting: Registered Nurse

## 2021-05-14 ENCOUNTER — Ambulatory Visit (INDEPENDENT_AMBULATORY_CARE_PROVIDER_SITE_OTHER): Payer: Medicare Other | Admitting: Registered Nurse

## 2021-05-14 ENCOUNTER — Other Ambulatory Visit: Payer: Self-pay

## 2021-05-14 DIAGNOSIS — J0140 Acute pansinusitis, unspecified: Secondary | ICD-10-CM | POA: Diagnosis not present

## 2021-05-14 DIAGNOSIS — J40 Bronchitis, not specified as acute or chronic: Secondary | ICD-10-CM

## 2021-05-14 MED ORDER — PREDNISONE 10 MG PO TABS: ORAL_TABLET | ORAL | 0 refills | Status: AC

## 2021-05-14 MED ORDER — AZITHROMYCIN 250 MG PO TABS: ORAL_TABLET | ORAL | 0 refills | Status: DC

## 2021-05-14 MED ORDER — TRIAMTERENE-HCTZ 37.5-25 MG PO TABS: 1.0000 | ORAL_TABLET | Freq: Every day | ORAL | 3 refills | Status: DC

## 2021-05-14 NOTE — Patient Instructions (Addendum)
Ms. Dorrene Bently to see you, sorry you're not feeling well  Have sent zpack and prednisone  If no improvement by Monday will consider alternative antibiotic or other aggressive treatment  Stay hydrated and get rest over the weekend  Thank you  Rich     If you have lab work done today you will be contacted with your lab results within the next 2 weeks.  If you have not heard from [place] then please contact us. The fastest way to get your results is to register for My Chart.   IF you received an x-ray today, you will receive an invoice from [facility]. Please contact [facility] at [phone] with questions or concerns regarding your invoice.   IF you received labwork today, you will receive an invoice from [facility]. Please contact [facility] at [phone] with questions or concerns regarding your invoice.   Our billing staff will not be able to assist you with questions regarding bills from these companies.  You will be contacted with the lab results as soon as they are available. The fastest way to get your results is to activate your My Chart account. Instructions are located on the last page of this paperwork. If you have not heard from [place] regarding the results in 2 weeks, please contact this office.

## 2021-05-15 NOTE — Progress Notes (Signed)
Established Patient Office Visit  Subjective:  Patient ID: Karen Jones, female    DOB: [birth year]  Age: 70 y.o. MRN: [id number]  CC:  Chief Complaint  Patient presents with   Sinusitis    Patient states she has had a sinus infection for other a week. Patient states she believes she has pneumonia because the she has more congestion in the chest and pain when she takes a deep breath.     HPI Karen Jones presents for sinus infection  Ongoing over one week. Worsening. Feels like it has migrated to chest Rattling, productive cough. Mild shob. Coughing fits have become more intense. She has some pleuritic pain when taking deep breaths.  No fever or chills. Denies nvd.  Has had hx of pna. Would prefer to head this off early.   Does note some mild dependent edema. Painless. No redness or heat, no skin breakdown. This has happened for her in the past with acute illnesses. No further symptoms suggestive of cardiac dysfunction.  Past Medical History:  Diagnosis Date   Allergy    Anemia    Benign positional vertigo    Cancer (HCC)    skin   Colon polyps    Pain in limb    Thyroid disease     Past Surgical History:  Procedure Laterality Date   ABDOMINAL HYSTERECTOMY     BILATERAL OOPHORECTOMY  1995   COLONOSCOPY  1995,1997,2001,2005,2010   polyps removed every colonoscopy except 1 time,per pt   HAND SURGERY     to remove webbing between fingers    POLYPECTOMY     TONSILLECTOMY     TUBAL LIGATION     UMBILICAL HERNIA REPAIR      Family History  Problem Relation Age of Onset   Emphysema Mother    Colon cancer Father 67   Cancer Father    Heart disease Father    Heart attack Father    Colon polyps Brother    Breast cancer Neg Hx    Esophageal cancer Neg Hx    Rectal cancer Neg Hx    Stomach cancer Neg Hx     Social History   Socioeconomic History   Marital status: Married    Spouse name: Not on file   Number of children: Not on file   Years of  education: Not on file   Highest education level: Not on file  Occupational History   Occupation: Retired  Tobacco Use   Smoking status: Never   Smokeless tobacco: Never  Vaping Use   Vaping Use: Never used  Substance and Sexual Activity   Alcohol use: Yes    Alcohol/week: 5.0 standard drinks    Types: 3 Glasses of wine, 2 Shots of liquor per week   Drug use: No   Sexual activity: Not Currently  Other Topics Concern   Not on file  Social History Narrative   Not on file   Social Determinants of Health   Financial Resource Strain: Low Risk    Difficulty of Paying Living Expenses: Not hard at all  Food Insecurity: No Food Insecurity   Worried About Charity fundraiser in the Last Year: Never true   Ran Out of Food in the Last Year: Never true  Transportation Needs: No Transportation Needs   Lack of Transportation (Medical): No   Lack of Transportation (Non-Medical): No  Physical Activity: Insufficiently Active   Days of Exercise per Week: 3 days   Minutes of Exercise per  Session: 30 min  Stress: No Stress Concern Present   Feeling of Stress : Not at all  Social Connections: Socially Integrated   Frequency of Communication with Friends and Family: More than three times a week   Frequency of Social Gatherings with Friends and Family: Once a week   Attends Religious Services: More than 4 times per year   Active Member of [employer] or Organizations: Yes   Attends Music therapist: More than 4 times per year   Marital Status: Married  Human resources officer Violence: Not At Risk   Fear of Current or Ex-Partner: No   Emotionally Abused: No   Physically Abused: No   Sexually Abused: No    Outpatient Medications Prior to Visit  Medication Sig Dispense Refill   albuterol (VENTOLIN HFA) 108 (90 Base) MCG/ACT inhaler Inhale 2 puffs into the lungs every 6 (six) hours as needed for wheezing or shortness of breath. 8 g 0   benzonatate (TESSALON) 100 MG capsule Take 1 capsule  (100 mg total) by mouth 3 (three) times daily as needed. 30 capsule 0   brompheniramine-pseudoephedrine-DM 30-2-10 MG/5ML syrup Take 5 mLs by mouth 4 (four) times daily as needed. 120 mL 0   ibuprofen (ADVIL,MOTRIN) 600 MG tablet As needed  0   levothyroxine (SYNTHROID) 75 MCG tablet TAKE 1 TABLET (75 MCG TOTAL) BY MOUTH DAILY WITH BREAKFAST. 90 tablet 0   loratadine (CLARITIN) 10 MG tablet Take 10 mg by mouth daily.     meclizine (ANTIVERT) 25 MG tablet Take 1 tablet (25 mg total) by mouth 3 (three) times daily as needed for dizziness. 30 tablet 0   mupirocin ointment (BACTROBAN) 2 % Apply 1 application topically 2 (two) times daily. 30 g 2   MYRBETRIQ 50 MG TB24 tablet      solifenacin (VESICARE) 10 MG tablet Take 1 tablet (10 mg total) by mouth daily. 30 tablet 3   azithromycin (ZITHROMAX) 250 MG tablet Take 2 tablets PO on day one, and one tablet PO daily thereafter until completed. (Patient not taking: Reported on 05/14/2021) 6 tablet 0   predniSONE (DELTASONE) 20 MG tablet Take 2 tablets (40 mg total) by mouth daily with breakfast. (Patient not taking: Reported on 05/14/2021) 10 tablet 0   No facility-administered medications prior to visit.    Allergies  Allergen Reactions   Penicillins Shortness Of Breath and Rash    REACTION: rash, difficulty breathing   Levofloxacin     Other reaction(s): Myalgias (intolerance)    ROS Review of Systems  Constitutional:  Positive for fatigue. Negative for activity change, appetite change, chills, diaphoresis, fever and unexpected weight change.  HENT:  Positive for postnasal drip, rhinorrhea, sinus pressure and sinus pain.   Eyes: Negative.   Respiratory:  Positive for cough, shortness of breath and wheezing. Negative for apnea, choking, chest tightness and stridor.   Cardiovascular: Negative.   Gastrointestinal: Negative.   Genitourinary: Negative.   Musculoskeletal: Negative.   Skin: Negative.   Neurological: Negative.    Psychiatric/Behavioral: Negative.    All other systems reviewed and are negative.    Objective:    Physical Exam Vitals and nursing note reviewed.  Constitutional:      General: She is not in acute distress.    Appearance: Normal appearance. She is normal weight. She is not ill-appearing, toxic-appearing or diaphoretic.  Cardiovascular:     Rate and Rhythm: Normal rate and regular rhythm.     Heart sounds: Normal heart sounds. No murmur heard.  No friction rub. No gallop.  Pulmonary:     Effort: Pulmonary effort is normal. No respiratory distress.     Breath sounds: No stridor. Wheezing present. No rhonchi or rales.  Chest:     Chest wall: Tenderness present.  Skin:    General: Skin is warm and dry.  Neurological:     General: No focal deficit present.     Mental Status: She is alert and oriented to person, place, and time. Mental status is at baseline.  Psychiatric:        Mood and Affect: Mood normal.        Behavior: Behavior normal.        Thought Content: Thought content normal.        Judgment: Judgment normal.    BP (!) 117/44   Pulse 64   Temp 98.1 F (36.7 C) (Temporal)   Resp 17   Ht 5\' 5"  (1.651 m)   Wt 198 lb 12.8 oz (90.2 kg)   SpO2 99%   BMI 33.08 kg/m  Wt Readings from Last 3 Encounters:  05/14/21 198 lb 12.8 oz (90.2 kg)  06/08/20 191 lb 12.8 oz (87 kg)  06/08/20 191 lb 12.8 oz (87 kg)     Health Maintenance Due  Topic Date Due   COVID-19 Vaccine (4 - Booster for Pfizer series) 06/29/2020   INFLUENZA VACCINE  02/22/2021    There are no preventive care reminders to display for this patient.  Lab Results  Component Value Date   TSH 1.05 06/08/2020   Lab Results  Component Value Date   WBC 5.4 06/08/2020   HGB 13.5 06/08/2020   HCT 40.8 06/08/2020   MCV 92.2 06/08/2020   PLT 276.0 06/08/2020   Lab Results  Component Value Date   NA 143 06/08/2020   K 4.1 06/08/2020   CO2 31 06/08/2020   GLUCOSE 84 06/08/2020   BUN 15  06/08/2020   CREATININE 0.80 06/08/2020   BILITOT 0.5 06/08/2020   ALKPHOS 42 06/08/2020   AST 16 06/08/2020   ALT 11 06/08/2020   PROT 6.6 06/08/2020   ALBUMIN 4.1 06/08/2020   CALCIUM 9.0 06/08/2020   GFR 74.17 06/08/2020   Lab Results  Component Value Date   CHOL 170 06/08/2020   Lab Results  Component Value Date   HDL 54.90 06/08/2020   Lab Results  Component Value Date   LDLCALC 92 06/08/2020   Lab Results  Component Value Date   TRIG 112.0 06/08/2020   Lab Results  Component Value Date   CHOLHDL 3 06/08/2020   Lab Results  Component Value Date   HGBA1C 5.9 06/04/2019      Assessment & Plan:   Problem List Items Addressed This Visit   None Visit Diagnoses     Acute non-recurrent pansinusitis       Relevant Medications   azithromycin (ZITHROMAX) 250 MG tablet   predniSONE (DELTASONE) 10 MG tablet   Bronchitis       Relevant Medications   triamterene-hydrochlorothiazide (MAXZIDE-25) 37.5-25 MG tablet   azithromycin (ZITHROMAX) 250 MG tablet   predniSONE (DELTASONE) 10 MG tablet       Meds ordered this encounter  Medications   triamterene-hydrochlorothiazide (MAXZIDE-25) 37.5-25 MG tablet    Sig: Take 1 tablet by mouth daily.    Dispense:  30 tablet    Refill:  3    Order Specific Question:   Supervising Provider    Answer:   Carlota Raspberry, JEFFREY R [[id number]]  azithromycin (ZITHROMAX) 250 MG tablet    Sig: Take 2 tablets PO on day one, and one tablet PO daily thereafter until completed.    Dispense:  6 tablet    Refill:  0    Order Specific Question:   Supervising Provider    Answer:   Carlota Raspberry, JEFFREY R [[id number]]   predniSONE (DELTASONE) 10 MG tablet    Sig: Take 3 tablets (30 mg total) by mouth daily with breakfast for 3 days, THEN 2 tablets (20 mg total) daily with breakfast for 3 days, THEN 1 tablet (10 mg total) daily with breakfast for 3 days.    Dispense:  18 tablet    Refill:  0    Order Specific Question:   Supervising Provider    Answer:    Carlota Raspberry, JEFFREY R [[id number]]    Follow-up: No follow-ups on file.   PLAN Has done well with repeat zpack in past, per pt. Discussed risks of this vs. Starting new abx, would likely opt for doxycycline. Pt would prefer to use zpack. If no improvement we will start doxycycline.  Prednisone taper for supportive care. Maxzide 25mg  po qd for dependent edema. Return if worsening or failing to improve. ER precautions reviewed with patient who voices understanding.  Patient encouraged to call clinic with any questions, comments, or concerns.  Maximiano Coss, NP

## 2021-05-17 ENCOUNTER — Other Ambulatory Visit: Payer: Self-pay | Admitting: Registered Nurse

## 2021-05-17 ENCOUNTER — Telehealth: Payer: Self-pay

## 2021-05-17 DIAGNOSIS — J0140 Acute pansinusitis, unspecified: Secondary | ICD-10-CM

## 2021-05-17 DIAGNOSIS — J40 Bronchitis, not specified as acute or chronic: Secondary | ICD-10-CM

## 2021-05-17 MED ORDER — DOXYCYCLINE HYCLATE 100 MG PO TABS: 100.0000 mg | ORAL_TABLET | Freq: Two times a day (BID) | ORAL | 0 refills | Status: DC

## 2021-05-17 NOTE — Telephone Encounter (Signed)
Caller name:Ashayla Sabra Heck   On DPR? :Yes  Call back number:[phone]  Provider they see: Maximiano Coss   Reason for call:Pt is calling she is not improving and Richard told her to call back if she is not and he would call something else in for her. Pt was called in Z-pack and Prednisone.

## 2021-05-17 NOTE — Telephone Encounter (Signed)
Pt.notified

## 2021-05-17 NOTE — Telephone Encounter (Signed)
Have sent doxycycline 100mg  po bid x 10 days.   Thanks  [employer]

## 2021-05-18 ENCOUNTER — Telehealth: Payer: Self-pay

## 2021-05-18 NOTE — Progress Notes (Signed)
Chronic Care Management Pharmacy Assistant   Name: Karen Jones  MRN: [id number] DOB: 30-Jun-1951  Karen Jones is Jones 70 y.o. year old female who presents for her initial CCM visit with the clinical pharmacist.  Reason for Encounter: Chart Prep for Initial CPP visit on 05/19/21 @ 9:00 am    Recent office visits:  05/14/21 Maximiano Coss, NP - Family Medicine - Acute pansinusitis - triamterene-hydrochlorothiazide (MAXZIDE-25) 37.5-25 MG tablet, azithromycin (ZITHROMAX) 250 MG tablet, and predniSONE (DELTASONE) 10 MG tablet prescribed. Follow up if no improvement.   05/07/21 Fenton Malling, [facility] - Family Medicine (Video Visit) - Acute pansinusitis - albuterol (VENTOLIN HFA) 108 (90 Base) MCG/ACT inhaler, azithromycin (ZITHROMAX) 250 MG tablet, benzonatate (TESSALON) 100 MG capsule, predniSONE (DELTASONE) 20 MG tablet, brompheniramine-pseudoephedrine-DM 30-2-10 MG/5ML syrup prescribed. Follow up if no improvement   Recent consult visits:  03/04/21 Pietro Cassis, MD - Otolaryngology - Otalgia (right) - follow-up with Dr. Redmond Baseman. I am going to have her start using some drops 10 days prior to the appointment with him to soften up the deposits so that he may possibly attempt removal of the crust  12/03/20 Daine Gravel - Overactive bladder - No notes available.   11/30/20 Dimas Aguas, DPM - Podiatry - Paronychia of great toe left foot -  reduced and reviewed condition debrided out the tissue recommended soaks and that this should heal uneventfully. Follow up as needed.   11/20/20 - Trudie Buckler, DPM - Podiatry - Ingrown toenail of left foot - doxycycline (VIBRA-TABS) 100 MG tablet Take 1 tablet (100 mg total) by mouth 2 (two) times daily prescribed. I/D completed in office. Follow up in 1 week.   11/19/20 Jacalyn Lefevre - Urology - No notes available.    Hospital visits:  None in previous 6 months  Medications: Outpatient Encounter Medications as of 05/18/2021  Medication  Sig   albuterol (VENTOLIN HFA) 108 (90 Base) MCG/ACT inhaler Inhale 2 puffs into the lungs every 6 (six) hours as needed for wheezing or shortness of breath.   azithromycin (ZITHROMAX) 250 MG tablet Take 2 tablets PO on day one, and one tablet PO daily thereafter until completed.   benzonatate (TESSALON) 100 MG capsule Take 1 capsule (100 mg total) by mouth 3 (three) times daily as needed.   brompheniramine-pseudoephedrine-DM 30-2-10 MG/5ML syrup Take 5 mLs by mouth 4 (four) times daily as needed.   doxycycline (VIBRA-TABS) 100 MG tablet Take 1 tablet (100 mg total) by mouth 2 (two) times daily.   ibuprofen (ADVIL,MOTRIN) 600 MG tablet As needed   levothyroxine (SYNTHROID) 75 MCG tablet TAKE 1 TABLET (75 MCG TOTAL) BY MOUTH DAILY WITH BREAKFAST.   loratadine (CLARITIN) 10 MG tablet Take 10 mg by mouth daily.   meclizine (ANTIVERT) 25 MG tablet Take 1 tablet (25 mg total) by mouth 3 (three) times daily as needed for dizziness.   mupirocin ointment (BACTROBAN) 2 % Apply 1 application topically 2 (two) times daily.   MYRBETRIQ 50 MG TB24 tablet    predniSONE (DELTASONE) 10 MG tablet Take 3 tablets (30 mg total) by mouth daily with breakfast for 3 days, THEN 2 tablets (20 mg total) daily with breakfast for 3 days, THEN 1 tablet (10 mg total) daily with breakfast for 3 days.   solifenacin (VESICARE) 10 MG tablet Take 1 tablet (10 mg total) by mouth daily.   triamterene-hydrochlorothiazide (MAXZIDE-25) 37.5-25 MG tablet Take 1 tablet by mouth daily.   No facility-administered encounter medications on file as of 05/18/2021.  Have you seen any other providers since your last visit? No  Any changes in your medications or health? Yes Patient reports she had a sinus infection and is on antibiotics and prednisone.    Any side effects from any medications? no  Do you have Jones symptoms or problems not managed by your medications? No  Any concerns about your health right now? No   Has your provider  asked that you check blood pressure, blood sugar, or follow special diet at home? no  Do you get any type of exercise on a regular basis? Yes patient reported she was but has been sick and not able to as much.   Can you think of a goal you would like to reach for your health? Patient reported she would like to lose some weight.   Do you have any problems getting your medications? no  Is there anything that you would like to discuss during the appointment? No  Please bring medications and supplements to appointment . Patient confirmed appointment date and time.    Care Gaps  AWV: done 06/08/20 due 06/08/21 Colonoscopy: due 07/09/21 DM Eye Exam: N/A DM Foot Exam:  N/A Microalbumin: N/A HbgAIC: done 06/04/19 (5.9) DEXA: done 07/30/20 Mammogram: done 01/22/21   Star Rating Drugs: No Star Rating Drugs noted.  Future Appointments  Date Time Provider [place]  05/19/2021  9:00 AM [facility] PHARMACIST [facility]  06/09/2021 10:00 AM Midge Minium, MD [facility] [place], [place] Pharmacist Assistant  [phone]  Time Spent: 43 minutes

## 2021-05-19 ENCOUNTER — Other Ambulatory Visit: Payer: Self-pay

## 2021-05-19 ENCOUNTER — Ambulatory Visit (INDEPENDENT_AMBULATORY_CARE_PROVIDER_SITE_OTHER): Payer: Medicare Other

## 2021-05-19 DIAGNOSIS — E669 Obesity, unspecified: Secondary | ICD-10-CM

## 2021-05-19 DIAGNOSIS — E039 Hypothyroidism, unspecified: Secondary | ICD-10-CM

## 2021-05-19 NOTE — Progress Notes (Signed)
Chronic Care Management Pharmacy Note  05/19/2021 Name:  Karen Jones MRN:  [id number] DOB:  06-26-1951  Summary: -[facility]: recommended daily d3 1000 units daily, had stopped d3-calcium due to constipation.  -update: now receiving botox for OAB, no longer using vesicare.   Subjective: Karen Jones is an 70 y.o. year old female who is a primary patient of Tabori, Aundra Millet, MD.  The CCM team was consulted for assistance with disease management and care coordination needs.    Engaged with patient face to face for initial visit in response to provider referral for pharmacy case management and/or care coordination services.   Consent to Services:  The patient was given the following information about Chronic Care Management services today, agreed to services, and gave verbal consent: 1. CCM service includes personalized support from designated clinical staff supervised by the primary care provider, including individualized plan of care and coordination with other care providers 2. 24/7 contact phone numbers for assistance for urgent and routine care needs. 3. Service will only be billed when office clinical staff spend 20 minutes or more in a month to coordinate care. 4. Only one practitioner may furnish and bill the service in a calendar month. 5.The patient may stop CCM services at any time (effective at the end of the month) by phone call to the office staff. 6. The patient will be responsible for cost sharing (co-pay) of up to 20% of the service fee (after annual deductible is met). Patient agreed to services and consent obtained.  Patient Care Team: Midge Minium, MD as PCP - General (Family Medicine) Loletha Carrow, Kirke Corin, MD as Consulting Physician (Gastroenterology) Linus Mako, MD as Consulting Physician (Family Medicine) Lubertha Sayres, MD as Referring Physician (Dermatology) [facility], [facility] (Specialist) Madelin Rear, [facility] as Pharmacist  (Pharmacist) Objective:  Lab Results  Component Value Date   CREATININE 0.80 06/08/2020   CREATININE 0.78 06/03/2019   CREATININE 0.78 05/23/2018    Lab Results  Component Value Date   HGBA1C 5.9 06/04/2019   Last diabetic Eye exam: No results found for: HMDIABEYEEXA  Last diabetic Foot exam: No results found for: HMDIABFOOTEX      Component Value Date/Time   CHOL 170 06/08/2020 1135   TRIG 112.0 06/08/2020 1135   HDL 54.90 06/08/2020 1135   CHOLHDL 3 06/08/2020 1135   VLDL 22.4 06/08/2020 1135   [place] 92 06/08/2020 1135    Hepatic Function Latest Ref Rng & Units 06/08/2020 06/03/2019 05/23/2018  Total Protein 6.0 - 8.3 g/dL 6.6 6.3 6.6  Albumin 3.5 - 5.2 g/dL 4.1 4.1 4.1  AST 0 - 37 U/L 16 16 15   ALT 0 - 35 U/L 11 10 10   Alk Phosphatase 39 - 117 U/L 42 46 42  Total Bilirubin 0.2 - 1.2 mg/dL 0.5 0.3 0.5  Bilirubin, Direct 0.0 - 0.3 mg/dL 0.1 - 0.1    Lab Results  Component Value Date/Time   TSH 1.05 06/08/2020 11:35 AM   TSH 0.76 06/03/2019 02:47 PM    CBC Latest Ref Rng & Units 06/08/2020 05/23/2018 05/18/2017  WBC 4.0 - 10.5 K/uL 5.4 5.9 6.5  Hemoglobin 12.0 - 15.0 g/dL 13.5 13.1 13.4  Hematocrit 36.0 - 46.0 % 40.8 39.1 41.1  Platelets 150.0 - 400.0 K/uL 276.0 262.0 297.0    No results found for: VD25OH  Clinical ASCVD: No  The 10-year ASCVD risk score (Arnett DK, et al., 2019) is: 9.6%   Values used to calculate the score:  Age: 64 years     Sex: Female     Is Non-Hispanic African American: No     Diabetic: No     Tobacco smoker: No     Systolic Blood Pressure: 161 mmHg     Is BP treated: No     HDL Cholesterol: 54.9 mg/dL     Total Cholesterol: 170 mg/dL    Social History   Tobacco Use  Smoking Status Never  Smokeless Tobacco Never   BP Readings from Last 3 Encounters:  05/14/21 (!) 117/44  06/08/20 126/66  06/08/20 126/66   Pulse Readings from Last 3 Encounters:  05/14/21 64  06/08/20 72  06/08/20 72   Wt Readings from Last 3  Encounters:  05/14/21 198 lb 12.8 oz (90.2 kg)  06/08/20 191 lb 12.8 oz (87 kg)  06/08/20 191 lb 12.8 oz (87 kg)    Assessment: Review of patient past medical history, allergies, medications, health status, including review of consultants reports, laboratory and other test data, was performed as part of comprehensive evaluation and provision of chronic care management services.   SDOH:  (Social Determinants of Health) assessments and interventions performed:    CCM Care Plan  Allergies  Allergen Reactions   Penicillins Shortness Of Breath and Rash    REACTION: rash, difficulty breathing   Levofloxacin     Other reaction(s): Myalgias (intolerance)    Medications Reviewed Today     Reviewed by Madelin Rear, [facility] (Pharmacist) on 05/19/21 at [place] List Status: <None>   Medication Order Taking? Sig Documenting Provider Last Dose Status Informant  albuterol (VENTOLIN HFA) 108 (90 Base) MCG/ACT inhaler [id number]  Inhale 2 puffs into the lungs every 6 (six) hours as needed for wheezing or shortness of breath. Fenton Malling M, PA-C  Active   benzonatate (TESSALON) 100 MG capsule [id number]  Take 1 capsule (100 mg total) by mouth 3 (three) times daily as needed. Mar Daring, [place]  Active   brompheniramine-pseudoephedrine-DM 30-2-10 MG/5ML syrup [id number]  Take 5 mLs by mouth 4 (four) times daily as needed. Fenton Malling M, PA-C  Active   doxycycline (VIBRA-TABS) 100 MG tablet [id number] Yes Take 1 tablet (100 mg total) by mouth 2 (two) times daily. Maximiano Coss, NP Taking Active   ibuprofen (ADVIL,MOTRIN) 600 MG tablet [id number]  As needed [provider]  Active   levothyroxine (SYNTHROID) 75 MCG tablet [id number] Yes TAKE 1 TABLET (75 MCG TOTAL) BY MOUTH DAILY WITH BREAKFAST. Midge Minium, MD Taking Active   loratadine (CLARITIN) 10 MG tablet [id number]  Take 10 mg by mouth daily. [provider]  Active   meclizine (ANTIVERT) 25 MG tablet  [id number]  Take 1 tablet (25 mg total) by mouth 3 (three) times daily as needed for dizziness. Midge Minium, MD  Active   mupirocin ointment (BACTROBAN) 2 % [id number]  Apply 1 application topically 2 (two) times daily. Bronson Ing, DPM  Active   predniSONE (DELTASONE) 10 MG tablet [id number] Yes Take 3 tablets (30 mg total) by mouth daily with breakfast for 3 days, THEN 2 tablets (20 mg total) daily with breakfast for 3 days, THEN 1 tablet (10 mg total) daily with breakfast for 3 days. Maximiano Coss, NP Taking Active   triamterene-hydrochlorothiazide (MAXZIDE-25) 37.5-25 MG tablet [id number]  Take 1 tablet by mouth daily. Maximiano Coss, NP  Active             Patient Active Problem List   Diagnosis Date Noted  OAB (overactive bladder) 06/08/2020   Hypothyroidism 04/21/2016   Obesity (BMI 30-39.9) 04/21/2016   Varicose veins 03/15/2011    Immunization History  Administered Date(s) Administered   Hepatitis A 03/09/2011, 09/21/2011   Hepatitis B 01/05/2011, 03/09/2011, 03/20/2012   Influenza, High Dose Seasonal PF 05/23/2018, 04/18/2019   Influenza,inj,Quad PF,6+ Mos 04/21/2016, 04/26/2017   Influenza-Unspecified 04/10/2014, 04/14/2015, 04/09/2020   PFIZER(Purple Top)SARS-COV-2 Vaccination 08/29/2019, 09/23/2019, 05/04/2020   Pneumococcal Conjugate-13 03/09/2015   Pneumococcal Polysaccharide-23 05/26/2016   Tdap 11/22/2004, 03/27/2015   Zoster Recombinat (Shingrix) 02/03/2018, 05/07/2018   Zoster, Live 06/24/2010    Conditions to be addressed/monitored: Hypothyroidism, Obesity, OAB  Care Plan : [place]  Updates made by Madelin Rear, [facility] since 05/19/2021 12:00 AM     Problem: hypothyroidism, obesity, OAB   Priority: High     Long-Range Goal: disease management   Start Date: 05/19/2021  This Visit's Progress: On track  Priority: High  Note:   Hypothyrodism (Goal: ensure consistent medication) -Controlled -Current treatment  Levothyroxine  0.75 mcg once daily  -Reviewed adherence - no issues or missed doses  -Medications previously tried: n/a  -Recommended to continue current medication   Osteopenia (Goal ensure appropriate supplementation) -Controlled -Has used d3-calcium prn due to consistipation. Diet consists of routine greens, cereal  -Last DEXA Scan: 07/2020   10-year probability of major osteoporotic fracture: <20%  10-year probability of hip fracture: <3% -Patient is not a candidate for pharmacologic treatment -Current treatment  Vitamin D3-Calcium PRN -Medications previously tried: n/a  -Recommend [phone] units of vitamin D daily. -Counseled on weight bearing exercises, including walking  OAB (Goal: ensure medication safety) -Controlled -Now going for botox - reports good control over urinary symptoms -Current treatment  Vesicare (not taking 10) -Medications previously tried: myrbetriq (not covered) -No Rx changes   Obesity(Goal to maintain diet and exercise efforts) -Not ideally controlled -Has wanted to get started on Qysmia in the past however difficulty with insurance/discount programs. No available patient assistance programs based off of income.  -Diet: greens, peanut butter and jelly sandwich, cereal and milk, chicken fish pork steak, occasional cookies and chips  -Usual pedal machine for exercise, has walked in the past and intends to make this a habit moving forward -Counseled on diet and exercise extensively    Medication Assistance: None required.  Patient affirms current coverage meets needs.  Patient's preferred pharmacy is:  [facility] [id number] - [place], [place] - [address] [place] [address] Phone: [phone] Fax: [phone]  [facility] [place], [place] [address] [place] [address] Phone: [phone] Fax: [phone]  [id number]  [place] [facility] [address] [place] [id number]: [phone]: [phone] Pt endorses 100% compliance  Follow Up:  Patient agrees to Care Plan and Follow-up.  Plan:  HC General f/u 6-8 months. Otherwise PRN pharmacist f/u.  Future Appointments  Date Time Provider DMorse 06/09/2021 10:00 AM TMidge Minium MD [facility] PharmD, CPP Clinical Pharmacist Practitioner  [place] ([phone]

## 2021-05-19 NOTE — Patient Instructions (Addendum)
Ms. Skolnik,  Thank you for talking with me today. I have included our care plan/goals in the following pages.   Please review and call me at [phone] with any questions.  Thanks! Ellin Mayhew, PharmD, CPP Clinical Pharmacist Practitioner  [phone]  Care Plan : [place]  Updates made by Madelin Rear, [facility] since 05/19/2021 12:00 AM     Problem: hypothyroidism, obesity, OAB   Priority: High     Long-Range Goal: disease management   Start Date: 05/19/2021  This Visit's Progress: On track  Priority: High  Note:   Hypothyrodism (Goal: ensure consistent medication) -Controlled -Current treatment  Levothyroxine 0.75 mcg once daily  -Reviewed adherence - no issues or missed doses  -Medications previously tried: n/a  -Recommended to continue current medication   Osteopenia (Goal ensure appropriate supplementation) -Controlled -Has used d3-calcium prn due to consistipation. Diet consists of routine greens, cereal  -Last DEXA Scan: 07/2020   10-year probability of major osteoporotic fracture: <20%  10-year probability of hip fracture: <3% -Patient is not a candidate for pharmacologic treatment -Current treatment  Vitamin D3-Calcium PRN -Medications previously tried: n/a  -Recommend [phone] units of vitamin D daily. -Counseled on weight bearing exercises, including walking  OAB (Goal: ensure medication safety) -Controlled -Now going for botox - reports good control over urinary symptoms -Current treatment  Vesicare (not taking 10) -Medications previously tried: myrbetriq (not covered) -No Rx changes   Obesity(Goal to maintain diet and exercise efforts) -Not ideally controlled -Has wanted to get started on Qysmia in the past however difficulty with insurance/discount programs. No available patient assistance programs based off of income.  -Diet: greens, peanut butter and jelly sandwich, cereal and milk, chicken fish pork steak, occasional  cookies and chips  -Usual pedal machine for exercise, has walked in the past and intends to make this a habit moving forward -Counseled on diet and exercise extensively     The patient was given the following information about Chronic Care Management services today, agreed to services, and gave verbal consent: 1. CCM service includes personalized support from designated clinical staff supervised by the primary care provider, including individualized plan of care and coordination with other care providers 2. 24/7 contact phone numbers for assistance for urgent and routine care needs. 3. Service will only be billed when office clinical staff spend 20 minutes or more in a month to coordinate care. 4. Only one practitioner may furnish and bill the service in a calendar month. 5.The patient may stop CCM services at any time (effective at the end of the month) by phone call to the office staff. 6. The patient will be responsible for cost sharing (co-pay) of up to 20% of the service fee (after annual deductible is met). Patient agreed to services and consent obtained.  The patient verbalized understanding of instructions provided today and agreed to receive a MyChart copy of patient instruction and/or educational materials. Telephone follow up appointment with pharmacy team member scheduled for: See next appointment with "Care Management Staff" under "What's Next" below.  Exercising to Lose Weight Getting regular exercise is important for everyone. It is especially important if you are overweight. Being overweight increases your risk of heart disease, stroke, diabetes, high blood pressure, and several types of cancer. Exercising, and reducing the calories you consume, can help you lose weight and improve fitness and health. Exercise can be moderate or vigorous intensity. To lose weight, most people need to do a certain amount of moderate  or vigorous-intensity exercise each week. How can exercise affect me? You  lose weight when you exercise enough to burn more calories than you eat. Exercise also reduces body fat and builds muscle. The more muscle you have, the more calories you burn. Exercise also: Improves mood. Reduces stress and tension. Improves your overall fitness, flexibility, and endurance. Increases bone strength. Moderate-intensity exercise Moderate-intensity exercise is any activity that gets you moving enough to burn at least three times more energy (calories) than if you were sitting. Examples of moderate exercise include: Walking a mile in 15 minutes. Doing light yard work. Biking at an easy pace. Most people should get at least 150 minutes of moderate-intensity exercise a week to maintain their body weight. Vigorous-intensity exercise Vigorous-intensity exercise is any activity that gets you moving enough to burn at least six times more calories than if you were sitting. When you exercise at this intensity, you should be working hard enough that you are not able to carry on a conversation. Examples of vigorous exercise include: Running. Playing a team sport, such as football, basketball, and soccer. Jumping rope. Most people should get at least 75 minutes a week of vigorous exercise to maintain their body weight. What actions can I take to lose weight? The amount of exercise you need to lose weight depends on: Your age. The type of exercise. Any health conditions you have. Your overall physical ability. Talk to your health care provider about how much exercise you need and what types of activities are safe for you. Nutrition  Make changes to your diet as told by your health care provider or diet and nutrition specialist (dietitian). This may include: Eating fewer calories. Eating more protein. Eating less unhealthy fats. Eating a diet that includes fresh fruits and vegetables, whole grains, low-fat dairy products, and lean protein. Avoiding foods with added fat, salt, and  sugar. Drink plenty of water while you exercise to prevent dehydration or heat stroke. Activity Choose an activity that you enjoy and set realistic goals. Your health care provider can help you make an exercise plan that works for you. Exercise at a moderate or vigorous intensity most days of the week. The intensity of exercise may vary from person to person. You can tell how intense a workout is for you by paying attention to your breathing and heartbeat. Most people will notice their breathing and heartbeat get faster with more intense exercise. Do resistance training twice each week, such as: Push-ups. Sit-ups. Lifting weights. Using resistance bands. Getting short amounts of exercise can be just as helpful as long, structured periods of exercise. If you have trouble finding time to exercise, try doing these things as part of your daily routine: Get up, stretch, and walk around every 30 minutes throughout the day. Go for a walk during your lunch break. Park your car farther away from your destination. If you take public transportation, get off one stop early and walk the rest of the way. Make phone calls while standing up and walking around. Take the stairs instead of elevators or escalators. Wear comfortable clothes and shoes with good support. Do not exercise so much that you hurt yourself, feel dizzy, or get very short of breath. Where to find more information U.S. Department of Health and Human Services: [employer]at Centers for Disease Control and Prevention: http://www.wolf.info/ Contact a health care provider: Before starting a new exercise program. If you have questions or concerns about your weight. If you have a medical problem that keeps you  from exercising. Get help right away if: You have any of the following while exercising: Injury. Dizziness. Difficulty breathing or shortness of breath that does not go away when you stop exercising. Chest pain. Rapid heartbeat. These  symptoms may represent a serious problem that is an emergency. Do not wait to see if the symptoms will go away. Get medical help right away. Call your local emergency services (911 in the [place].S[place] Do not drive yourself to the hospital. Summary Getting regular exercise is especially important if you are overweight. Being overweight increases your risk of heart disease, stroke, diabetes, high blood pressure, and several types of cancer. Losing weight happens when you burn more calories than you eat. Reducing the amount of calories you eat, and getting regular moderate or vigorous exercise each week, helps you lose weight. This information is not intended to replace advice given to you by your health care provider. Make sure you discuss any questions you have with your health care provider. Document Revised: 09/06/2020 Document Reviewed: 09/06/2020 Elsevier Patient Education  2022 [employer].

## 2021-05-24 DIAGNOSIS — E039 Hypothyroidism, unspecified: Secondary | ICD-10-CM | POA: Diagnosis not present

## 2021-05-31 ENCOUNTER — Encounter: Payer: Self-pay | Admitting: Gastroenterology

## 2021-06-03 DIAGNOSIS — H9201 Otalgia, right ear: Secondary | ICD-10-CM | POA: Diagnosis not present

## 2021-06-03 DIAGNOSIS — Z9622 Myringotomy tube(s) status: Secondary | ICD-10-CM | POA: Diagnosis not present

## 2021-06-03 DIAGNOSIS — Z23 Encounter for immunization: Secondary | ICD-10-CM | POA: Diagnosis not present

## 2021-06-05 ENCOUNTER — Other Ambulatory Visit: Payer: Self-pay | Admitting: Family Medicine

## 2021-06-05 DIAGNOSIS — E039 Hypothyroidism, unspecified: Secondary | ICD-10-CM

## 2021-06-09 ENCOUNTER — Other Ambulatory Visit: Payer: Self-pay

## 2021-06-09 ENCOUNTER — Ambulatory Visit (HOSPITAL_BASED_OUTPATIENT_CLINIC_OR_DEPARTMENT_OTHER)
Admission: RE | Admit: 2021-06-09 | Discharge: 2021-06-09 | Disposition: A | Discharge status: Home / Self Care | Payer: Medicare Other | Source: Ambulatory Visit | Attending: Family Medicine | Admitting: Family Medicine

## 2021-06-09 ENCOUNTER — Ambulatory Visit (INDEPENDENT_AMBULATORY_CARE_PROVIDER_SITE_OTHER): Payer: Medicare Other | Admitting: Family Medicine

## 2021-06-09 ENCOUNTER — Encounter: Payer: Self-pay | Admitting: Family Medicine

## 2021-06-09 VITALS — BP 118/74 | HR 63 | Temp 97.3°F | Resp 16 | Wt 186.8 lb

## 2021-06-09 DIAGNOSIS — R42 Dizziness and giddiness: Secondary | ICD-10-CM | POA: Diagnosis not present

## 2021-06-09 DIAGNOSIS — E039 Hypothyroidism, unspecified: Secondary | ICD-10-CM | POA: Diagnosis not present

## 2021-06-09 DIAGNOSIS — R0781 Pleurodynia: Secondary | ICD-10-CM

## 2021-06-09 DIAGNOSIS — R600 Localized edema: Secondary | ICD-10-CM | POA: Diagnosis not present

## 2021-06-09 DIAGNOSIS — E669 Obesity, unspecified: Secondary | ICD-10-CM

## 2021-06-09 DIAGNOSIS — R918 Other nonspecific abnormal finding of lung field: Secondary | ICD-10-CM | POA: Diagnosis not present

## 2021-06-09 LAB — BASIC METABOLIC PANEL
BUN: 17 mg/dL (ref 6–23)
CO2: 26 mEq/L (ref 19–32)
Calcium: 9.4 mg/dL (ref 8.4–10.5)
Chloride: 105 mEq/L (ref 96–112)
Creatinine, Ser: 0.96 mg/dL (ref 0.40–1.20)
GFR: 59.18 mL/min — ABNORMAL LOW (ref >60.00)
Glucose, Bld: 89 mg/dL (ref 70–99)
Potassium: 4.1 mEq/L (ref 3.5–5.1)
Sodium: 143 mEq/L (ref 135–145)

## 2021-06-09 LAB — HEPATIC FUNCTION PANEL
ALT: 12 U/L (ref 0–35)
AST: 15 U/L (ref 0–37)
Albumin: 4.1 g/dL (ref 3.5–5.2)
Alkaline Phosphatase: 49 U/L (ref 39–117)
Bilirubin, Direct: 0.1 mg/dL (ref 0.0–0.3)
Total Bilirubin: 0.5 mg/dL (ref 0.2–1.2)
Total Protein: 6.4 g/dL (ref 6.0–8.3)

## 2021-06-09 LAB — CBC WITH DIFFERENTIAL/PLATELET
Basophils Absolute: 0.1 10*3/uL (ref 0.0–0.1)
Basophils Relative: 1 % (ref 0.0–3.0)
Eosinophils Absolute: 0.1 10*3/uL (ref 0.0–0.7)
Eosinophils Relative: 1.4 % (ref 0.0–5.0)
HCT: 40.9 % (ref 36.0–46.0)
Hemoglobin: 13.1 g/dL (ref 12.0–15.0)
Lymphocytes Relative: 24.6 % (ref 12.0–46.0)
Lymphs Abs: 1.2 10*3/uL (ref 0.7–4.0)
MCHC: 32.2 g/dL (ref 30.0–36.0)
MCV: 92.5 fl (ref 78.0–100.0)
Monocytes Absolute: 0.4 10*3/uL (ref 0.1–1.0)
Monocytes Relative: 8.2 % (ref 3.0–12.0)
Neutro Abs: 3.2 10*3/uL (ref 1.4–7.7)
Neutrophils Relative %: 64.8 % (ref 43.0–77.0)
Platelets: 313 10*3/uL (ref 150.0–400.0)
RBC: 4.42 Mil/uL (ref 3.87–5.11)
RDW: 16.1 % — ABNORMAL HIGH (ref 11.5–15.5)
WBC: 5 10*3/uL (ref 4.0–10.5)

## 2021-06-09 LAB — LIPID PANEL
Cholesterol: 198 mg/dL (ref 0–200)
HDL: 67.3 mg/dL (ref >39.00)
LDL Cholesterol: 108 mg/dL — ABNORMAL HIGH (ref 0–99)
NonHDL: 131.07
Total CHOL/HDL Ratio: 3
Triglycerides: 114 mg/dL (ref 0.0–149.0)
VLDL: 22.8 mg/dL (ref 0.0–40.0)

## 2021-06-09 LAB — TSH: TSH: 0.92 u[IU]/mL (ref 0.35–5.50)

## 2021-06-09 MED ORDER — MECLIZINE HCL 25 MG PO TABS: 25.0000 mg | ORAL_TABLET | Freq: Three times a day (TID) | ORAL | 0 refills | Status: DC | PRN

## 2021-06-09 NOTE — Assessment & Plan Note (Signed)
Chronic problem.  Currently asymptomatic on Levothyroxine 75mcg daily.  Check labs.  Adjust meds prn  

## 2021-06-09 NOTE — Assessment & Plan Note (Signed)
Pt is down 12 lbs since 10/21.  This is b/c she had a respiratory illness and could not eat w/o coughing.  Encouraged healthy diet as appetite returns.  Will follow.

## 2021-06-09 NOTE — Assessment & Plan Note (Signed)
New to provider, ongoing for pt.  She has been taking Triamterene HCTZ as needed for swelling but wasn't aware this was a blood pressure medication.  When she needs a refill of medication to improve LE edema, she will let me know and I will send in low dose Lasix.  Pt expressed understanding and is in agreement w/ plan.

## 2021-06-09 NOTE — Patient Instructions (Signed)
Follow up in 1 year or as needed Go to [facility] to get your CXR- Imaging is 1 floor below the lobby We'll notify you of your lab results and make any changes if needed Continue to work on healthy diet and regular exercise- you can do it! Call with any questions or concerns Stay Safe!  Stay Healthy! Happy Holidays!!

## 2021-06-09 NOTE — Progress Notes (Signed)
   Subjective:    Patient ID: Karen Jones, female    DOB: 08-16-1950, 70 y.o.   MRN: [id number]  HPI Hypothyroid- chronic problem, on Levothyroxine 68mcg daily.  Pt reports good energy level.  No changes to skin/hair/nails.  Obesity- pt is down 12 lbs since 10/21.  BMI 31.09  Pt reports prior to her last visit she had just returned from vacation and had eaten quite a bit.  Then she got sick and her appetite decreased.  Ankle swelling- pt has been taking Triamterene HCTZ as needed for ankle swelling.  Pleuritic chest pain- pt reports she was sick most of October and completed a 10 day course of Doxycycline.  Completed 2 rounds of Prednisone, using albuterol inhaler.  Still has some tightness and pain w/ deep breaths but states she feels '95% better'.   Review of Systems For ROS see HPI   This visit occurred during the SARS-CoV-2 public health emergency.  Safety protocols were in place, including screening questions prior to the visit, additional usage of staff PPE, and extensive cleaning of exam room while observing appropriate contact time as indicated for disinfecting solutions.      Objective:   Physical Exam Vitals reviewed.  Constitutional:      General: She is not in acute distress.    Appearance: She is well-developed. She is not ill-appearing.  HENT:     Head: Normocephalic and atraumatic.  Eyes:     Conjunctiva/sclera: Conjunctivae normal.     Pupils: Pupils are equal, round, and reactive to light.  Neck:     Thyroid: No thyromegaly.  Cardiovascular:     Rate and Rhythm: Normal rate and regular rhythm.     Heart sounds: Normal heart sounds. No murmur heard. Pulmonary:     Effort: Pulmonary effort is normal. No respiratory distress.     Breath sounds: Normal breath sounds. No wheezing or rhonchi.  Chest:     Chest wall: No tenderness.  Abdominal:     General: There is no distension.     Palpations: Abdomen is soft.     Tenderness: There is no abdominal  tenderness.  Musculoskeletal:     Cervical back: Normal range of motion and neck supple.     Right lower leg: No edema.     Left lower leg: No edema.  Lymphadenopathy:     Cervical: No cervical adenopathy.  Skin:    General: Skin is warm and dry.  Neurological:     Mental Status: She is alert and oriented to person, place, and time.  Psychiatric:        Mood and Affect: Mood normal.        Behavior: Behavior normal.          Assessment & Plan:  Pleuritic CP- new.  Pt reports central chest pain- like a burning- when she takes deep breaths.  No wheezing.  Continues to cough in the evenings.  No leg swelling or redness or TTP.  Suspect this is airway inflammation from recent illness and should improve w/ time.  Pt reports she is '95%' better.  Has Albuterol inhaler to use and has completed 2 rounds of Prednisone.  Get CXR to assess given ongoing cough and determine if more needs to be done other than watchful waiting.  Pt expressed understanding and is in agreement w/ plan.

## 2021-06-11 ENCOUNTER — Encounter: Payer: Self-pay | Admitting: Family Medicine

## 2021-06-11 ENCOUNTER — Other Ambulatory Visit: Payer: Self-pay | Admitting: Family Medicine

## 2021-06-11 ENCOUNTER — Telehealth: Payer: Self-pay

## 2021-06-11 DIAGNOSIS — A319 Mycobacterial infection, unspecified: Secondary | ICD-10-CM

## 2021-06-11 DIAGNOSIS — J479 Bronchiectasis, uncomplicated: Secondary | ICD-10-CM

## 2021-06-11 DIAGNOSIS — R9389 Abnormal findings on diagnostic imaging of other specified body structures: Secondary | ICD-10-CM

## 2021-06-11 NOTE — Telephone Encounter (Signed)
Attempted to call patient about results. No answer and no vm was available

## 2021-06-11 NOTE — Progress Notes (Signed)
CT and referrals placed based on abnormal findings on pt's CXR.

## 2021-06-11 NOTE — Telephone Encounter (Signed)
-----   Message from Juliann Pulse, [place] sent at 06/10/2021  1:41 PM EST -----  ----- Message ----- From: Midge Minium, MD Sent: 06/10/2021   8:41 AM EST To: [facility] Clinical Pool  Your chest xray is possibly concerning for atypical infection.  Based on this, we are going to do a number of things.  We are going to refer you to infectious disease (stat) for a complete evaluation and any antibiotic treatment if needed.  We are also going to refer you to pulmonary as this could impact your lungs.  And we will do the high resolution CT scan sooner rather than later to try and get more information (CT Chest High Resolution- dx abnormal CXR, STAT)

## 2021-06-14 ENCOUNTER — Encounter (HOSPITAL_BASED_OUTPATIENT_CLINIC_OR_DEPARTMENT_OTHER): Payer: Self-pay

## 2021-06-14 ENCOUNTER — Ambulatory Visit: Payer: Medicare Other | Admitting: Internal Medicine

## 2021-06-14 ENCOUNTER — Other Ambulatory Visit: Payer: Self-pay

## 2021-06-14 ENCOUNTER — Ambulatory Visit (HOSPITAL_BASED_OUTPATIENT_CLINIC_OR_DEPARTMENT_OTHER)
Admission: RE | Admit: 2021-06-14 | Discharge: 2021-06-14 | Disposition: A | Discharge status: Home / Self Care | Payer: Medicare Other | Source: Ambulatory Visit | Attending: Family Medicine | Admitting: Family Medicine

## 2021-06-14 DIAGNOSIS — J479 Bronchiectasis, uncomplicated: Secondary | ICD-10-CM | POA: Insufficient documentation

## 2021-06-14 DIAGNOSIS — I700 Atherosclerosis of aorta: Secondary | ICD-10-CM | POA: Diagnosis not present

## 2021-06-14 DIAGNOSIS — R9389 Abnormal findings on diagnostic imaging of other specified body structures: Secondary | ICD-10-CM | POA: Diagnosis not present

## 2021-06-14 DIAGNOSIS — R918 Other nonspecific abnormal finding of lung field: Secondary | ICD-10-CM | POA: Diagnosis not present

## 2021-06-14 DIAGNOSIS — A319 Mycobacterial infection, unspecified: Secondary | ICD-10-CM | POA: Insufficient documentation

## 2021-06-16 ENCOUNTER — Ambulatory Visit (INDEPENDENT_AMBULATORY_CARE_PROVIDER_SITE_OTHER): Payer: Medicare Other | Admitting: Internal Medicine

## 2021-06-16 ENCOUNTER — Other Ambulatory Visit: Payer: Self-pay

## 2021-06-16 VITALS — BP 116/73 | HR 81 | Resp 16 | Ht 65.0 in | Wt 189.0 lb

## 2021-06-16 DIAGNOSIS — R9389 Abnormal findings on diagnostic imaging of other specified body structures: Secondary | ICD-10-CM

## 2021-06-16 DIAGNOSIS — Z114 Encounter for screening for human immunodeficiency virus [HIV]: Secondary | ICD-10-CM

## 2021-06-16 DIAGNOSIS — R059 Cough, unspecified: Secondary | ICD-10-CM

## 2021-06-16 NOTE — Progress Notes (Signed)
[place] for Infectious Disease  Reason for Consult:chest ct Referring Provider: Tabori    Patient Active Problem List   Diagnosis Date Noted   Lower extremity edema 06/09/2021   OAB (overactive bladder) 06/08/2020   Hypothyroidism 04/21/2016   Obesity (BMI 30-39.9) 04/21/2016   Varicose veins 03/15/2011      HPI: Karen Jones is a 70 y.o. female referred here for abnormal ct chest finding concerning for atypical mycobacterial infection  She has been well at baseline health until 6 weeks ago She flew to [place] and was on a bus tour with around 17 people. She went with her husband. "Everyone was having the sniffle." She returned with upper respiratory tract congestion and cough. Eventually feeling like her chest/lung is involved. She denies fever/chill.  She had seen her pcp for this 3 times and was given 2 courses azith/pred/cough med and then doxy. Her congestion/cough started to do better during the doxy course. But then she had pleuritic chest pain so a chest ct was obtained on 11/21 (2 days prior to this) that suggest atypical mycobacterial infection feature  By now she is almost back to baseline outside the mild pleuritic chest pain Cough almost resolved. 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 Has been to [place] distant past No unpasteurized dairy product  All other ros negative  She wasn't tested for for covid during this event  Review of Systems: ROS All other ros negative       Past Medical History:  Diagnosis Date   Allergy    Anemia    Benign positional vertigo    Cancer ([place])    skin    Colon polyps    Pain in limb    Thyroid disease     Social History   Tobacco Use   Smoking status: Never   Smokeless tobacco: Never  Vaping Use   Vaping Use: Never used  Substance Use Topics   Alcohol use: Yes    Alcohol/week: 5.0 standard drinks    Types: 3 Glasses of wine, 2 Shots of liquor per week   Drug use: No    Family History  Problem Relation Age of Onset   Emphysema Mother    Colon cancer Father 78   Cancer Father    Heart disease Father    Heart attack Father    Colon polyps Brother    Breast cancer Neg Hx    Esophageal cancer Neg Hx    Rectal cancer Neg Hx    Stomach cancer Neg Hx     Allergies  Allergen Reactions   Penicillins Shortness Of Breath and Rash    REACTION: rash, difficulty breathing   Levofloxacin     Other reaction(s): Myalgias (intolerance)    OBJECTIVE: Vitals:   06/16/21 0858  BP: 116/73  Pulse: 81  Resp: 16  SpO2: 95%  Weight: 189 lb (85.7 kg)  Height: 5\' 5"  (1.651 m)   Body mass index is 31.45 kg/m.   Physical Exam General/constitutional: no distress, pleasant HEENT: Normocephalic, PER, Conj Clear, EOMI, Oropharynx clear Neck supple CV: rrr no mrg Lungs: slight dry crackle bilateral bases, normal respiratory effort Abd:  Soft, Nontender Ext: no edema Skin: No Rash Neuro: nonfocal MSK: no peripheral joint swelling/tenderness/warmth; back spines nontender  Lab: Lab Results  Component Value Date   WBC 5.0 06/09/2021   HGB 13.1 06/09/2021   HCT 40.9 06/09/2021   MCV 92.5 06/09/2021   PLT 313.0 56/31/4970   Last metabolic panel Lab Results  Component Value Date   GLUCOSE 89 06/09/2021   NA 143 06/09/2021   K 4.1 06/09/2021   CL 105 06/09/2021   CO2 26 06/09/2021   BUN 17 06/09/2021   CREATININE 0.96 06/09/2021   CALCIUM 9.4 06/09/2021   PROT 6.4 06/09/2021   ALBUMIN 4.1 06/09/2021   BILITOT 0.5 06/09/2021   ALKPHOS 49 06/09/2021   AST 15 06/09/2021   ALT 12 06/09/2021     Microbiology:  Serology:  Imaging: 11/21 chest ct 1. There is heterogeneous consolidation and centrilobular nodularity in the dependent bilateral lung bases, posterior lingula, and medial segment right middle lobe with associated scarring and volume loss. Diffuse bilateral bronchial wall thickening. Findings are are particularly suggestive of atypical mycobacterial infection, differential considerations also including chronic/recurrent aspiration. 2. Coronary artery disease.   Assessment/plan: Problem List Items Addressed This Visit   None Visit Diagnoses     Abnormal chest CT    -  Primary   Relevant Orders   MYCOBACTERIA, CULTURE, WITH FLUOROCHROME SMEAR   MYCOBACTERIA, CULTURE, WITH FLUOROCHROME SMEAR   MYCOBACTERIA, CULTURE, WITH FLUOROCHROME SMEAR   Respiratory or Resp and Sputum Culture   Histoplasma Antibodies   Blastomyces Antigen   Cryptococcal Antigen   Lactate dehydrogenase   Fungitell, Serum   Aspergillus Antigen, Serum   Cough, unspecified type       Relevant Orders   MYCOBACTERIA, CULTURE, WITH FLUOROCHROME SMEAR   MYCOBACTERIA, CULTURE, WITH FLUOROCHROME SMEAR   MYCOBACTERIA, CULTURE, WITH FLUOROCHROME SMEAR   Respiratory or Resp and Sputum Culture   Histoplasma Antibodies   Blastomyces Antigen   Cryptococcal Antigen   Lactate dehydrogenase   Fungitell, Serum   Aspergillus Antigen, Serum   Screening for HIV (human immunodeficiency virus)           Suspect she has a viral respiratory process 6 weeks ago. Suspect the ct finding unmask a silent chronic parenchymal change likely not related to the viral process mentioned.    Discuss with her reason for referal is to consider NTM infection Discuss with her natural history, diagnostic criteria, and feature of ntm Discuss with her the difficulty/poor efficacy of treating ntm, which occurs only with patients with symptoms, imaging/micro supporting criteria  Discuss also with her making a ntm lung  infection dx require to rule out alternative process (aspiration, connective tissue-autoimmune)  The ct scan doesn't suggest malignant process  Coming from [place], meliodosis is a consideration but her clinical history is not suggestive. The endemic fungi penicillium we do not have serologic test for. But low suspicion for fungal process as well  -endemic fungi serology, sputum culture (bacterial and NTMx3) -- discuss how to collect ntm sputum cx -no abx indicated at this time -follow up with pulm initial visit; might need bronch if she can't get sputum -might need repeat chest ct in a few weeks -follow up with me in 10 weeks, for sputum to grow, or sooner if fever/chill/nightsweat, worsening cough, dyspnea          Follow-up: Return in about 10 weeks (around 08/25/2021).  Jabier Mutton, [place] [place] [phone] pager  [phone] cell 06/16/2021, 9:43 AM

## 2021-06-16 NOTE — Patient Instructions (Signed)
At this time, we are lacking a diagnosis still   Your ct scan of the chest could suggest feature of an atypical bacterial process. 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 The diagnosis is confirmed by persistently seeing this bacteria in the lung, symptoms consistent with its infection (progressive chronic short of breath, fever, chill, nightsweat, weight loss), and imaging suggestion   The other causes that could potentially have similar ct scan finding include chronic aspiration, or inflammatory non-infectious pneumonitis. Your lung doctor will discuss with you about this more   We will do blood tests to screen for fungal disease, get sputum to check for bacteria and these atypical organism. For your sputum, we need 3 morning sputum on separate days over the next few weeks.   **do not gargle/consume water/brush your teeth before you get these morning sputum, as the bacteria live in water and can "contaminate" your sample  If you can't get any sputum, we might ask your lung doctor to perform an endoscopic procedure into your lungs to obtain some  The last thing to note is that the sputum culture would take 6 weeks to grow if it will grow any of these atypical organisms at all    For now do these labs and see [place] in 8-10 weeks for follow up. If you have worsening sx in terms of dyspnea, productive cough, fever, chill, however please do see [place] sooner.   As you are getting better, I do not see reason to treat you for simple bacterial pneumonia process at this time (this can coexist with atypical bacterial infection mentioned above).

## 2021-06-22 ENCOUNTER — Encounter: Payer: Self-pay | Admitting: Internal Medicine

## 2021-06-23 ENCOUNTER — Encounter: Payer: Self-pay | Admitting: Student

## 2021-06-23 ENCOUNTER — Other Ambulatory Visit: Payer: Self-pay

## 2021-06-23 ENCOUNTER — Ambulatory Visit (INDEPENDENT_AMBULATORY_CARE_PROVIDER_SITE_OTHER): Payer: Medicare Other | Admitting: Student

## 2021-06-23 VITALS — BP 130/78 | HR 72 | Resp 16 | Ht 65.0 in | Wt 192.0 lb

## 2021-06-23 DIAGNOSIS — J479 Bronchiectasis, uncomplicated: Secondary | ICD-10-CM

## 2021-06-23 MED ORDER — ALBUTEROL SULFATE HFA 108 (90 BASE) MCG/ACT IN AERS: 2.0000 | INHALATION_SPRAY | Freq: Two times a day (BID) | RESPIRATORY_TRACT | 6 refills | Status: DC

## 2021-06-23 NOTE — Progress Notes (Signed)
Synopsis: Referred for bronchiectasis by Midge Minium, MD  Subjective:   PATIENT ID: Waldron Session: female DOB: Jan 14, 1951, MRN: [id number]  Chief Complaint  Patient presents with   bronchestasis   22yF with history of allergy who is referred for bronchiectasis.  She says she went to [place] 04/11/21 on a bus trip and several folks developed sinus issues/cough. She says no one had covid but no one, including her, was tested for covid. She didn't start feeling bad until 10/2. Got to [facility] and seemed to really worsen after [place] dinner/show, remembers it being quite cold that night. Tried various symptomatic measures without improvement. A great deal of chest congestion, sinus congestion, very productive cough. Maybe low grade fever, no night sweats, unintentional weight loss.   Husband after they got back to [place] had similar symptoms, was prescribed doxy with improvement in his symptoms.   She was eventually seen by physician with telehealth visit through [facility] 10/14 who prescribed prednisone, z pack, tessalon perles, sudafed, albuterol. Re-upped prednisone at a visit a week later with FNP. Eventually she was also prescribed doxycycline for 10d course which dramatically improved symptoms. Subsequently developed pleurtic CP so CT Chest obtained 11/21 showing bronchiectasis, mucus plugging, atelectasis. Pleuritic CP persists. She has no DOE.   She has occasional issues with coughing while she's eating/drinking.   Otherwise pertinent review of systems is negative.  Mother died of emphysema  She has had some secondhand smoke exposure growing up.    Past Medical History:  Diagnosis Date   Allergy    Anemia    Benign positional vertigo    Cancer ([facility])    skin   Colon polyps    Pain in limb    Thyroid disease      Family History  Problem Relation Age of Onset   Emphysema Mother    Colon cancer Father 60   Cancer Father    Heart disease Father    Heart attack  Father    Colon polyps Brother    Breast cancer Neg Hx    Esophageal cancer Neg Hx    Rectal cancer Neg Hx    Stomach cancer Neg Hx      Past Surgical History:  Procedure Laterality Date   ABDOMINAL HYSTERECTOMY     BILATERAL OOPHORECTOMY  1995   COLONOSCOPY  1995,1997,2001,2005,2010   polyps removed every colonoscopy except 1 time,per pt   HAND SURGERY     to remove webbing between fingers    POLYPECTOMY     TONSILLECTOMY     TUBAL LIGATION     UMBILICAL HERNIA REPAIR      Social History   Socioeconomic History   Marital status: Married    Spouse name: Not on file   Number of children: Not on file   Years of education: Not on file   Highest education level: Not on file  Occupational History   Occupation: Retired  Tobacco Use   Smoking status: Never   Smokeless tobacco: Never  Vaping Use   Vaping Use: Never used  Substance and Sexual Activity   Alcohol use: Yes    Alcohol/week: 5.0 standard drinks    Types: 3 Glasses of wine, 2 Shots of liquor per week   Drug use: No   Sexual activity: Not Currently  Other Topics Concern   Not on file  Social History Narrative   Not on file   Social Determinants of Health   Financial Resource Strain: Not on file  Food Insecurity: Not on file  Transportation Needs: Not on file  Physical Activity: Not on file  Stress: Not on file  Social Connections: Not on file  Intimate Partner Violence: Not on file     Allergies  Allergen Reactions   Penicillins Shortness Of Breath and Rash    REACTION: rash, difficulty breathing   Levofloxacin     Other reaction(s): Myalgias (intolerance)     Outpatient Medications Prior to Visit  Medication Sig Dispense Refill   ibuprofen (ADVIL,MOTRIN) 600 MG tablet As needed  0   levothyroxine (SYNTHROID) 75 MCG tablet TAKE 1 TABLET (75 MCG TOTAL) BY MOUTH DAILY WITH BREAKFAST. 90 tablet 0   loratadine (CLARITIN) 10 MG tablet Take 10 mg by mouth daily.     meclizine (ANTIVERT) 25 MG tablet  Take 1 tablet (25 mg total) by mouth 3 (three) times daily as needed for dizziness. 30 tablet 0   albuterol (VENTOLIN HFA) 108 (90 Base) MCG/ACT inhaler Inhale 2 puffs into the lungs every 6 (six) hours as needed for wheezing or shortness of breath. 8 g 0   benzonatate (TESSALON) 100 MG capsule Take 1 capsule (100 mg total) by mouth 3 (three) times daily as needed. 30 capsule 0   No facility-administered medications prior to visit.       Objective:   Physical Exam:  General appearance: 70 y.o., female, NAD, conversant  Eyes: anicteric sclerae, moist conjunctivae; no lid-lag; PERRL, tracking appropriately HENT: NCAT; oropharynx, MMM, no mucosal ulcerations; normal hard and soft palate Neck: Trachea midline; no lymphadenopathy, no JVD Lungs: CTAB, no crackles, no wheeze, with normal respiratory effort CV: RRR, no MRGs  Abdomen: Soft, non-tender; non-distended, BS present  Extremities: No peripheral edema, radial and DP pulses present bilaterally  Skin: Normal temperature, turgor and texture; no rash Psych: Appropriate affect Neuro: Alert and oriented to person and place, no focal deficit    Vitals:   06/23/21 1419  BP: 130/78  Pulse: 72  Resp: 16  SpO2: 96%  Weight: 192 lb (87.1 kg)  Height: 5\' 5"  (1.651 m)   96% on RA BMI Readings from Last 3 Encounters:  06/23/21 31.95 kg/m  06/16/21 31.45 kg/m  06/09/21 31.09 kg/m   Wt Readings from Last 3 Encounters:  06/23/21 192 lb (87.1 kg)  06/16/21 189 lb (85.7 kg)  06/09/21 186 lb 12.8 oz (84.7 kg)     CBC    Component Value Date/Time   WBC 5.0 06/09/2021 1041   RBC 4.42 06/09/2021 1041   HGB 13.1 06/09/2021 1041   HCT 40.9 06/09/2021 1041   PLT 313.0 06/09/2021 1041   MCV 92.5 06/09/2021 1041   MCH 29.5 04/21/2016 1625   MCHC 32.2 06/09/2021 1041   RDW 16.1 (H) 06/09/2021 1041   LYMPHSABS 1.2 06/09/2021 1041   MONOABS 0.4 06/09/2021 1041   EOSABS 0.1 06/09/2021 1041   BASOSABS 0.1 06/09/2021 1041    Low  level eosinophilia  Chest Imaging: HRCT Chest 06/14/21 reviewed by me remarkable for lower lobe/RML/lingula predominant bronchiectasis with mucus plugging and atelectasis,scar  Pulmonary Functions Testing Results: No flowsheet data found.      Assessment & Plan:   # Bronchiectasis: # Recent LRTI:  In agreement with ID that course seems most consistent with viral LRTI superimposed on background of a more chronic process. Etiology of her bronchiectasis is unclear - whether caused by or consequence of NTM or other atypical infection, connective tissue disease, aspiration given lower lobe predominance (at least recurrent macroaspiration seems  unlikely by history), immune deficiency.    Plan: - RF, CCP, Ig levels, IgE, A1AT - discuss MBS next visit if all unrevealing - PFTs next available - if noninvasive workup unrevealing then consideration of bronchoscopy, particularly if there is unexplained lung function impairment - albuterol 2 puffs BID - flutter valve 10 slow but firm puffs twice daily  - AFB sputum, sputum culture if she can come up with it after starting airway clearance (which seems unlikely)      Maryjane Hurter, MD [facility] 06/23/2021 2:51 PM

## 2021-06-23 NOTE — Patient Instructions (Addendum)
-   you will be called to schedule PFTs (breathing tests) - send me a message to discuss infection testing when it's all back so we can discuss whether or not to pursue bronchoscopy - labs today - albuterol 2 puffs twice daily - after albuterol flutter valve 10 slow but firm puffs twice daily

## 2021-06-25 LAB — MVISTA BLASTOMYCES QNT AG, URINE
Interpretation: NEGATIVE
Result (ng/ml): NOT DETECTED ng/mL

## 2021-06-25 LAB — TEST AUTHORIZATION 2

## 2021-06-25 LAB — CRYPTOCOCCAL AG, LTX SCR RFLX TITER
Cryptococcal Ag Screen: NOT DETECTED
MICRO NUMBER:: 12689764
SPECIMEN QUALITY:: ADEQUATE

## 2021-06-25 LAB — ASPERGILLUS ANTIGEN,SERUM
Aspergillus Ag, EIA: NOT DETECTED
Index Value: 0.04 (ref <0.50)

## 2021-06-25 LAB — TEST AUTHORIZATION

## 2021-06-25 LAB — HISTOPLASMA ANTIBODIES
H Band: NEGATIVE
M Band: NEGATIVE

## 2021-07-03 ENCOUNTER — Encounter: Payer: Self-pay | Admitting: Student

## 2021-07-03 LAB — IGG, IGA, IGM
IgG (Immunoglobin G), Serum: 657 mg/dL (ref 600–1540)
IgM, Serum: 170 mg/dL (ref 50–300)
Immunoglobulin A: 173 mg/dL (ref 70–320)

## 2021-07-03 LAB — ALPHA-1 ANTITRYPSIN PHENOTYPE: A-1 Antitrypsin, Ser: 156 mg/dL (ref 83–199)

## 2021-07-03 LAB — IGE: IgE (Immunoglobulin E), Serum: 3 kU/L (ref <=114)

## 2021-07-03 LAB — RHEUMATOID FACTOR: Rheumatoid fact SerPl-aCnc: <14 IU/mL (ref <14)

## 2021-07-05 ENCOUNTER — Telehealth: Payer: Self-pay

## 2021-07-05 NOTE — Telephone Encounter (Signed)
Dr Verlee Monte, please advise on pt email, thanks!  All my blood tests by Dr Gale Journey are back.  You said to let you know when that happened.  They never did one for fungitell and the head of the lab told me she didn't know if or when they were going to do it.  I haven't heard anything yet about a breathing test that I thought you were sending me to do.  Is that because you were waiting for the blood tests to come back first?

## 2021-07-05 NOTE — Telephone Encounter (Signed)
Dr. Loletha Carrow  In working up chart for PV, it was noted that pt recently traveled out of the country and is now having lung issues.  She is being followed by Infectious Disease and Pulmonology and has been tested for all type of bacterium.    Not sure if infection or something else going on  She has abn chest x-ray and is waiting to be scheduled for pulmonary function tests.  Please advise on how you'd like to proceed--PV 12/20-procedure 1/5  She does not go back to ID unitl 2/3 and Pulmonary until 1/27  Thank you

## 2021-07-06 NOTE — Telephone Encounter (Signed)
Thank you for the note and diligent chart review.  I reviewed pulmonary and ID notes as well.  Routine surveillance colonoscopy.  It would be best to wait until pulmonary condition completely worked up and understood before patient undergoes sedation for a colonoscopy.  Please contact her and make a clinic appointment to see me in early to mid February after the other specialist visits.  - HD

## 2021-07-06 NOTE — Telephone Encounter (Signed)
Attempted to call to discuss results, left voicemail. Depending on how she's feeling we could consider bronchoscopy/bronchoalveolar lavage to look for evidence of chronic infection. However if she's feeling relatively well currently then even if we found evidence of MAI or other infection, risks of treatment may outweigh benefit. Happy to discuss further with her.

## 2021-07-07 DIAGNOSIS — Z23 Encounter for immunization: Secondary | ICD-10-CM | POA: Diagnosis not present

## 2021-07-08 ENCOUNTER — Telehealth: Payer: Self-pay | Admitting: Student

## 2021-07-08 NOTE — Telephone Encounter (Signed)
ATC pt. Line continued to ring without VM. WCB.   See email from 12/10.

## 2021-07-09 NOTE — Telephone Encounter (Signed)
Spoke with the pt and scheduled appt with Dr Verlee Monte for 07/22/21

## 2021-07-09 NOTE — Telephone Encounter (Signed)
Thank you for the note.  If there is recurrent bleeding, please give the patient a sooner appointment to see me or an APP.  - HD

## 2021-07-09 NOTE — Telephone Encounter (Signed)
Dr. Loletha Carrow:  Scheduled this pt with you on 2/3 at 1:40 after she sees her ID provider.  However she reports for the last 3 days she has had bright red blood on  toilet paper, on stool and in bowl.  I asked her to call us back if it continues or get worse.  I just wanted to make you aware.   Thank you.

## 2021-07-21 NOTE — H&P (View-Only) (Signed)
Synopsis: Referred for bronchiectasis by Midge Minium, MD  Subjective:   PATIENT ID: Karen Jones: female DOB: 03-Oct-1950, MRN: [id number]  Chief Complaint  Patient presents with   Follow-up    Breathing is overall doing well. She only coughs occ, non prod. She is having some increased discomfort in her chest with taking a deep breath x 2 wks.    70yF with history of allergy, R t-tube placement 2017 who is referred for bronchiectasis.  She says she went to [place] 04/11/21 on a bus trip and several folks developed sinus issues/cough. She says no one had covid but no one, including her, was tested for covid. She didn't start feeling bad until 10/2. Got to [facility] and seemed to really worsen after [place] dinner/show, remembers it being quite cold that night. Tried various symptomatic measures without improvement. A great deal of chest congestion, sinus congestion, very productive cough. Maybe low grade fever, no night sweats, unintentional weight loss.   Husband after they got back to [place] had similar symptoms, was prescribed doxy with improvement in his symptoms.   She was eventually seen by physician with telehealth visit through [facility] 10/14 who prescribed prednisone, z pack, tessalon perles, sudafed, albuterol. Re-upped prednisone at a visit a week later with FNP. Eventually she was also prescribed doxycycline for 10d course which dramatically improved symptoms. Subsequently developed pleurtic CP so CT Chest obtained 11/21 showing bronchiectasis, mucus plugging, atelectasis. Pleuritic CP persists. She has no DOE.   She has occasional issues with coughing while she's eating/drinking.   Mother died of emphysema  She has had some secondhand smoke exposure growing up.  Interval HPI: Last seen by me 06/23/21 at which point we were still waiting on portion of noninvasive infectious workup, which all resulted negative. Recommended airway clearance with SABA, flutter and PFTs  which were obtained today and remarkable for .  She says she's really not feeling much better since last visit. Still has pleuritic CP which seems to have migrated a bit further up chest. Has been using SABA/flutter valve diligently without improvement and without helping her cough up phlegm.   Otherwise pertinent review of systems is negative.      Past Medical History:  Diagnosis Date   Allergy    Anemia    Benign positional vertigo    Cancer (HCC)    skin   Colon polyps    Pain in limb    Thyroid disease      Family History  Problem Relation Age of Onset   Emphysema Mother    Colon cancer Father 56   Cancer Father    Heart disease Father    Heart attack Father    Colon polyps Brother    Breast cancer Neg Hx    Esophageal cancer Neg Hx    Rectal cancer Neg Hx    Stomach cancer Neg Hx      Past Surgical History:  Procedure Laterality Date   ABDOMINAL HYSTERECTOMY     BILATERAL OOPHORECTOMY  1995   COLONOSCOPY  1995,1997,2001,2005,2010   polyps removed every colonoscopy except 1 time,per pt   HAND SURGERY     to remove webbing between fingers    POLYPECTOMY     TONSILLECTOMY     TUBAL LIGATION     UMBILICAL HERNIA REPAIR      Social History   Socioeconomic History   Marital status: Married    Spouse name: Not on file   Number of children: Not  on file   Years of education: Not on file   Highest education level: Not on file  Occupational History   Occupation: Retired  Tobacco Use   Smoking status: Never   Smokeless tobacco: Never  Vaping Use   Vaping Use: Never used  Substance and Sexual Activity   Alcohol use: Yes    Alcohol/week: 5.0 standard drinks    Types: 3 Glasses of wine, 2 Shots of liquor per week   Drug use: No   Sexual activity: Not Currently  Other Topics Concern   Not on file  Social History Narrative   Not on file   Social Determinants of Health   Financial Resource Strain: Not on file  Food Insecurity: Not on file   Transportation Needs: Not on file  Physical Activity: Not on file  Stress: Not on file  Social Connections: Not on file  Intimate Partner Violence: Not on file     Allergies  Allergen Reactions   Penicillins Shortness Of Breath and Rash    REACTION: rash, difficulty breathing   Levofloxacin     Other reaction(s): Myalgias (intolerance)     Outpatient Medications Prior to Visit  Medication Sig Dispense Refill   albuterol (VENTOLIN HFA) 108 (90 Base) MCG/ACT inhaler Inhale 2 puffs into the lungs in the morning and at bedtime. 8 g 6   ibuprofen (ADVIL,MOTRIN) 600 MG tablet As needed  0   levothyroxine (SYNTHROID) 75 MCG tablet TAKE 1 TABLET (75 MCG TOTAL) BY MOUTH DAILY WITH BREAKFAST. 90 tablet 0   loratadine (CLARITIN) 10 MG tablet Take 10 mg by mouth daily.     meclizine (ANTIVERT) 25 MG tablet Take 1 tablet (25 mg total) by mouth 3 (three) times daily as needed for dizziness. 30 tablet 0   No facility-administered medications prior to visit.       Objective:   Physical Exam:  General appearance: 70 y.o., female, NAD, conversant  Eyes: anicteric sclerae; PERRL, tracking appropriately HENT: NCAT; MMM Neck: Trachea midline; no lymphadenopathy, no JVD Lungs: CTAB, no crackles, no wheeze, with normal respiratory effort CV: RRR, no murmur  Abdomen: Soft, non-tender; non-distended, BS present  Extremities: No peripheral edema, warm Skin: Normal turgor and texture; no rash Psych: Appropriate affect Neuro: Alert and oriented to person and place, no focal deficit     Vitals:   07/22/21 1428  BP: 128/70  Pulse: 67  Temp: 98 F (36.7 C)  TempSrc: Oral  SpO2: 97%  Weight: 191 lb 3.2 oz (86.7 kg)  Height: _0  (1.651 m)    97% on RA BMI Readings from Last 3 Encounters:  07/22/21 31.82 kg/m  06/23/21 31.95 kg/m  06/16/21 31.45 kg/m   Wt Readings from Last 3 Encounters:  07/22/21 191 lb 3.2 oz (86.7 kg)  06/23/21 192 lb (87.1 kg)  06/16/21 189 lb (85.7 kg)      CBC    Component Value Date/Time   WBC 5.0 06/09/2021 1041   RBC 4.42 06/09/2021 1041   HGB 13.1 06/09/2021 1041   HCT 40.9 06/09/2021 1041   PLT 313.0 06/09/2021 1041   MCV 92.5 06/09/2021 1041   MCH 29.5 04/21/2016 1625   MCHC 32.2 06/09/2021 1041   RDW 16.1 (H) 06/09/2021 1041   LYMPHSABS 1.2 06/09/2021 1041   MONOABS 0.4 06/09/2021 1041   EOSABS 0.1 06/09/2021 1041   BASOSABS 0.1 06/09/2021 1041    Low level eosinophilia A1AT phenotype PI*MM, normal level IgE 3 Ig levels WNL RF negative Endemic fungal  serologies negative  Chest Imaging: HRCT Chest 06/14/21 reviewed by me remarkable for lower lobe/RML/lingula predominant bronchiectasis with mucus plugging and atelectasis,scar  Pulmonary Functions Testing Results: No flowsheet data found.      Assessment & Plan:   # Bronchiectasis: # Recent LRTI:  In agreement with ID that course seems most consistent with viral LRTI superimposed on background of a more chronic process. Etiology of her bronchiectasis is unclear - whether caused by or consequence of NTM or other atypical infection, connective tissue disease but screening tests negative, aspiration given lower lobe predominance (at least recurrent macroaspiration seems unlikely by history), immune deficiency but testing for CVID neg.    Plan: - PFTs later this month - bronch/BAL/airway inspection under general. Although current symptom burden is relatively limited, she places high value on diagnostic certainty.  - can stop SABA/flutter - she doesn't have productive cough currently  I spent 42 minutes dedicated to the care of this patient on the date of this encounter to include pre-visit review of records, face-to-face time with the patient discussing conditions above, post visit ordering of testing, clinical documentation with the electronic health record, making appropriate referrals as documented, and communicating necessary findings to members of the patients  care team.       Maryjane Hurter, MD [facility] 07/22/2021 5:39 PM

## 2021-07-21 NOTE — Progress Notes (Signed)
Synopsis: Referred for bronchiectasis by Midge Minium, MD  Subjective:   PATIENT ID: Karen Jones: female DOB: [birth year], MRN: [id number]  Chief Complaint  Patient presents with   Follow-up    Breathing is overall doing well. She only coughs occ, non prod. She is having some increased discomfort in her chest with taking a deep breath x 2 wks.    16yF with history of allergy, R t-tube placement 2017 who is referred for bronchiectasis.  She says she went to [place] 04/11/21 on a bus trip and several folks developed sinus issues/cough. She says no one had covid but no one, including her, was tested for covid. She didn't start feeling bad until 10/2. Got to [facility] and seemed to really worsen after [place] dinner/show, remembers it being quite cold that night. Tried various symptomatic measures without improvement. A great deal of chest congestion, sinus congestion, very productive cough. Maybe low grade fever, no night sweats, unintentional weight loss.   Husband after they got back to [place] had similar symptoms, was prescribed doxy with improvement in his symptoms.   She was eventually seen by physician with telehealth visit through [facility] 10/14 who prescribed prednisone, z pack, tessalon perles, sudafed, albuterol. Re-upped prednisone at a visit a week later with FNP. Eventually she was also prescribed doxycycline for 10d course which dramatically improved symptoms. Subsequently developed pleurtic CP so CT Chest obtained 11/21 showing bronchiectasis, mucus plugging, atelectasis. Pleuritic CP persists. She has no DOE.   She has occasional issues with coughing while she's eating/drinking.   Mother died of emphysema  She has had some secondhand smoke exposure growing up.  Interval HPI: Last seen by me 06/23/21 at which point we were still waiting on portion of noninvasive infectious workup, which all resulted negative. Recommended airway clearance with SABA, flutter and PFTs  which were obtained today and remarkable for .  She says she's really not feeling much better since last visit. Still has pleuritic CP which seems to have migrated a bit further up chest. Has been using SABA/flutter valve diligently without improvement and without helping her cough up phlegm.   Otherwise pertinent review of systems is negative.      Past Medical History:  Diagnosis Date   Allergy    Anemia    Benign positional vertigo    Cancer (HCC)    skin   Colon polyps    Pain in limb    Thyroid disease      Family History  Problem Relation Age of Onset   Emphysema Mother    Colon cancer Father 52   Cancer Father    Heart disease Father    Heart attack Father    Colon polyps Brother    Breast cancer Neg Hx    Esophageal cancer Neg Hx    Rectal cancer Neg Hx    Stomach cancer Neg Hx      Past Surgical History:  Procedure Laterality Date   ABDOMINAL HYSTERECTOMY     BILATERAL OOPHORECTOMY  1995   COLONOSCOPY  1995,1997,2001,2005,2010   polyps removed every colonoscopy except 1 time,per pt   HAND SURGERY     to remove webbing between fingers    POLYPECTOMY     TONSILLECTOMY     TUBAL LIGATION     UMBILICAL HERNIA REPAIR      Social History   Socioeconomic History   Marital status: Married    Spouse name: Not on file   Number of children: Not  on file  ° Years of education: Not on file  ° Highest education level: Not on file  °Occupational History  ° Occupation: Retired  °Tobacco Use  ° Smoking status: Never  ° Smokeless tobacco: Never  °Vaping Use  ° Vaping Use: Never used  °Substance and Sexual Activity  ° Alcohol use: Yes  °  Alcohol/week: 5.0 standard drinks  °  Types: 3 Glasses of wine, 2 Shots of liquor per week  ° Drug use: No  ° Sexual activity: Not Currently  °Other Topics Concern  ° Not on file  °Social History Narrative  ° Not on file  ° °Social Determinants of Health  ° °Financial Resource Strain: Not on file  °Food Insecurity: Not on file   °Transportation Needs: Not on file  °Physical Activity: Not on file  °Stress: Not on file  °Social Connections: Not on file  °Intimate Partner Violence: Not on file  °  ° °Allergies  °Allergen Reactions  ° Penicillins Shortness Of Breath and Rash  °  REACTION: rash, difficulty breathing  ° Levofloxacin   °  Other reaction(s): Myalgias (intolerance)  °  ° °Outpatient Medications Prior to Visit  °Medication Sig Dispense Refill  ° albuterol (VENTOLIN HFA) 108 (90 Base) MCG/ACT inhaler Inhale 2 puffs into the lungs in the morning and at bedtime. 8 g 6  ° ibuprofen (ADVIL,MOTRIN) 600 MG tablet As needed  0  ° levothyroxine (SYNTHROID) 75 MCG tablet TAKE 1 TABLET (75 MCG TOTAL) BY MOUTH DAILY WITH BREAKFAST. 90 tablet 0  ° loratadine (CLARITIN) 10 MG tablet Take 10 mg by mouth daily.    ° meclizine (ANTIVERT) 25 MG tablet Take 1 tablet (25 mg total) by mouth 3 (three) times daily as needed for dizziness. 30 tablet 0  ° °No facility-administered medications prior to visit.  ° ° ° ° ° °Objective:  ° °Physical Exam: ° °General appearance: 70 y.o., female, NAD, conversant  °Eyes: anicteric sclerae; PERRL, tracking appropriately °HENT: NCAT; MMM °Neck: Trachea midline; no lymphadenopathy, no JVD °Lungs: CTAB, no crackles, no wheeze, with normal respiratory effort °CV: RRR, no murmur  °Abdomen: Soft, non-tender; non-distended, BS present  °Extremities: No peripheral edema, warm °Skin: Normal turgor and texture; no rash °Psych: Appropriate affect °Neuro: Alert and oriented to person and place, no focal deficit  ° ° ° °Vitals:  ° 07/22/21 1428  °BP: 128/70  °Pulse: 67  °Temp: 98 °F (36.7 °C)  °TempSrc: Oral  °SpO2: 97%  °Weight: 191 lb 3.2 oz (86.7 kg)  °Height: 5' 5" (1.651 m)  ° ° °97% on RA °BMI Readings from Last 3 Encounters:  °07/22/21 31.82 kg/m²  °06/23/21 31.95 kg/m²  °06/16/21 31.45 kg/m²  ° °Wt Readings from Last 3 Encounters:  °07/22/21 191 lb 3.2 oz (86.7 kg)  °06/23/21 192 lb (87.1 kg)  °06/16/21 189 lb (85.7 kg)   ° ° ° °CBC °   °Component Value Date/Time  ° WBC 5.0 06/09/2021 1041  ° RBC 4.42 06/09/2021 1041  ° HGB 13.1 06/09/2021 1041  ° HCT 40.9 06/09/2021 1041  ° PLT 313.0 06/09/2021 1041  ° MCV 92.5 06/09/2021 1041  ° MCH 29.5 04/21/2016 1625  ° MCHC 32.2 06/09/2021 1041  ° RDW 16.1 (H) 06/09/2021 1041  ° LYMPHSABS 1.2 06/09/2021 1041  ° MONOABS 0.4 06/09/2021 1041  ° EOSABS 0.1 06/09/2021 1041  ° BASOSABS 0.1 06/09/2021 1041  ° ° °Low level eosinophilia °A1AT phenotype PI*MM, normal level °IgE 3 °Ig levels WNL °RF negative °Endemic fungal   serologies negative  Chest Imaging: HRCT Chest 06/14/21 reviewed by me remarkable for lower lobe/RML/lingula predominant bronchiectasis with mucus plugging and atelectasis,scar  Pulmonary Functions Testing Results: No flowsheet data found.      Assessment & Plan:   # Bronchiectasis: # Recent LRTI:  In agreement with ID that course seems most consistent with viral LRTI superimposed on background of a more chronic process. Etiology of her bronchiectasis is unclear - whether caused by or consequence of NTM or other atypical infection, connective tissue disease but screening tests negative, aspiration given lower lobe predominance (at least recurrent macroaspiration seems unlikely by history), immune deficiency but testing for CVID neg.    Plan: - PFTs later this month - bronch/BAL/airway inspection under general. Although current symptom burden is relatively limited, she places high value on diagnostic certainty.  - can stop SABA/flutter - she doesn't have productive cough currently  I spent 42 minutes dedicated to the care of this patient on the date of this encounter to include pre-visit review of records, face-to-face time with the patient discussing conditions above, post visit ordering of testing, clinical documentation with the electronic health record, making appropriate referrals as documented, and communicating necessary findings to members of the patients  care team.       Maryjane Hurter, MD [facility] 07/22/2021 5:39 PM

## 2021-07-22 ENCOUNTER — Other Ambulatory Visit: Payer: Self-pay

## 2021-07-22 ENCOUNTER — Telehealth: Payer: Self-pay | Admitting: Student

## 2021-07-22 ENCOUNTER — Ambulatory Visit (INDEPENDENT_AMBULATORY_CARE_PROVIDER_SITE_OTHER): Payer: Medicare Other | Admitting: Student

## 2021-07-22 ENCOUNTER — Encounter: Payer: Self-pay | Admitting: Student

## 2021-07-22 VITALS — BP 128/70 | HR 67 | Temp 98.0°F | Ht 65.0 in | Wt 191.2 lb

## 2021-07-22 DIAGNOSIS — J479 Bronchiectasis, uncomplicated: Secondary | ICD-10-CM | POA: Diagnosis not present

## 2021-07-22 NOTE — Patient Instructions (Addendum)
-   We will schedule bronchoscopy hopefully next week  - Nothing to eat or drink after midnight the night before and will need a ride to take you home that day - let me know which lab test you saw that you had questions about - change follow up appointment at front desk with me to 6 weeks from today

## 2021-07-22 NOTE — Telephone Encounter (Signed)
Pi*MM phenotype and her activity level are totally normal. Does not have alpha-1 antitrypsin deficiency as cause of her bronchiectasis.

## 2021-07-22 NOTE — Telephone Encounter (Signed)
Received a MyChart message from patient stating that the lab test she had a question about was the alpha-1 antitrypsin phenotype.   Dr. Verlee Monte, can you please advise? Thanks!

## 2021-07-22 NOTE — Telephone Encounter (Signed)
Please schedule the following:  Provider performing procedure:Jayle Solarz Diagnosis: bronchiectasis Which side if for nodule / mass? N/a Procedure: bronchoscopy, bronchoalveolar lavage  Has patient been spoken to by Provider and given informed consent? Yes Anesthesia: Yes Do you need Fluro? No Duration of procedure: 30 minutes Date: 07/28/21 preferred Alternate Date: 07/29/21  Time: Prefer PM Location: [place] [facility] Does patient have OSA? no DM? no Or Latex allergy? no Medication Restriction: none Anticoagulate/Antiplatelet: none Pre-op Labs Ordered:determined by Anesthesia Imaging request: none  (If, SuperDimension CT Chest, please have STAT courier sent to ENDO)  Please coordinate Pre-op COVID Testing

## 2021-07-23 ENCOUNTER — Ambulatory Visit: Payer: Medicare Other | Admitting: Student

## 2021-07-27 ENCOUNTER — Encounter: Payer: Self-pay | Admitting: Student

## 2021-07-28 NOTE — Telephone Encounter (Signed)
Dr. Verlee Monte I am very sorry that I am just not getting to this procedure message with our schedules and the holidays. What are some alternative dates that this patient can be scheduled for her bronch since I am unable to do today or tomorrow.

## 2021-07-29 ENCOUNTER — Encounter: Payer: Medicare Other | Admitting: Gastroenterology

## 2021-07-29 ENCOUNTER — Telehealth: Payer: Self-pay | Admitting: Student

## 2021-07-29 NOTE — Telephone Encounter (Signed)
Dr. Verlee Monte we need new dates that you would like to set up for this patient

## 2021-07-29 NOTE — Telephone Encounter (Signed)
Apologies for delay, was tied up on consults today. Of the dates she's available (1/17-1/19) unfortunately I'm not in the hospital. I'll ask the other partners who are on service those days to see if they'd be willing to do it otherwise would have to wait until any day 1/24-1/27 (but would have to be first case of the day since I'm in clinic those days). Once I hear back from colleagues on service 1/17-1/19 I'll let you know.

## 2021-07-29 NOTE — Telephone Encounter (Signed)
Called and spoke to pt. Informed her of the delay in getting her bronch scheduled and profusely apologized for the delay. Pt states she cannot do bronch during week of 1/9 through 1/13 as she will be out of town. Pt has a PFT on 1/16 and cannot do bronch on 9/16 due to a conflicting appt. Pt is able to do bronch on 1/17, 1/18, or 1/19.   Will forward to Dr. Verlee Monte as Juluis Rainier as a new date is needed.

## 2021-07-30 NOTE — Telephone Encounter (Signed)
Please see previous message dated 07/22/21

## 2021-07-30 NOTE — Telephone Encounter (Signed)
Maryjane Hurter, MD 4 hours ago (10:18 AM)   I know these aren't the dates that she submitted but I'm not available on the days she requested.   Provider performing procedure:Meier  Diagnosis: right hilar mass Which side if for nodule / mass? right Procedure: bronchoscopy, bronchoalveolar lavage Has patient been spoken to by Provider and given informed consent? Yes Anesthesia: General Do you need Fluro? No Duration of procedure: 30 minutes Date: any of the following: 1/24, 1/25,1/26,1/27 Alternate Date: 1/20 (any time of day) Time: needs to be first case of the day (i'm in clinic starting at [place]) Location: [facility] Does patient have OSA? no DM? no Or Latex allergy? no Medication Restriction: none Anticoagulate/Antiplatelet: none Pre-op Labs Ordered:determined by Anesthesia Imaging request: None  (If, SuperDimension CT Chest, please have STAT courier sent to ENDO)

## 2021-07-30 NOTE — Telephone Encounter (Signed)
I know these aren't the dates that she submitted but I'm not available on the days she requested.  Provider performing procedure:Reegan Bouffard  Diagnosis: right hilar mass Which side if for nodule / mass? right Procedure: bronchoscopy, bronchoalveolar lavage Has patient been spoken to by Provider and given informed consent? Yes Anesthesia: General Do you need Fluro? No Duration of procedure: 30 minutes Date: any of the following: 1/24, 1/25,1/26,1/27 Alternate Date: 1/20 (any time of day) Time: needs to be first case of the day (i'm in clinic starting at [place]) Location[facility] Does patient have OSA? no DM? no Or Latex allergy? no Medication Restriction: none Anticoagulate/Antiplatelet: none Pre-op Labs Ordered:determined by Anesthesia Imaging request: None  (If, SuperDimension CT Chest, please have STAT courier sent to ENDO)

## 2021-08-02 NOTE — Telephone Encounter (Signed)
Called and spoke with patient's husband Jenny Reichmann. Let them know their Bronch is scheduled for 08/17/2021 with Dr. Verlee Monte at [facility].  Patient was instructed to arrive at hospital at 5:30 am. They were instructed to bring someone with them as they will not be able to drive home from procedure. Patient instructed not to have anything to eat or drink after midnight.   Patient's covid screening is scheduled for 08/13/2021 between 8 am and 3 pm.  Patient voiced understanding, nothing further needed  Routing to [place] as [employer]

## 2021-08-02 NOTE — Telephone Encounter (Signed)
Called and spoke with patient's husband Jenny Reichmann. Let them know their Bronch is scheduled for 08/17/2021 with Dr. Verlee Monte at [facility].  Patient was instructed to arrive at hospital at 5:30 am. They were instructed to bring someone with them as they will not be able to drive home from procedure. Patient instructed not to have anything to eat or drink after midnight.   Patient's covid screening is scheduled for 08/13/2021 between 8 am and 3 pm.  Patient voiced understanding, nothing further needed  Routing to [place] as [employer]

## 2021-08-02 NOTE — Telephone Encounter (Signed)
Called and spoke with patient's husband Jenny Reichmann. Let them know their Bronch is scheduled for 08/17/2021 with Dr. Verlee Monte at [facility].  Patient was instructed to arrive at hospital at 5:30 am. They were instructed to bring someone with them as they will not be able to drive home from procedure. Patient instructed not to have anything to eat or drink after midnight.   Patient's covid screening is scheduled for 08/13/2021 between 8 am and 3 pm.  Patient voiced understanding, nothing further needed  Routing to [place] as [employer]

## 2021-08-09 ENCOUNTER — Ambulatory Visit (INDEPENDENT_AMBULATORY_CARE_PROVIDER_SITE_OTHER): Payer: Medicare Other | Admitting: Student

## 2021-08-09 ENCOUNTER — Other Ambulatory Visit: Payer: Self-pay

## 2021-08-09 ENCOUNTER — Other Ambulatory Visit: Payer: Self-pay | Admitting: Registered Nurse

## 2021-08-09 DIAGNOSIS — J40 Bronchitis, not specified as acute or chronic: Secondary | ICD-10-CM

## 2021-08-09 DIAGNOSIS — J479 Bronchiectasis, uncomplicated: Secondary | ICD-10-CM | POA: Diagnosis not present

## 2021-08-09 LAB — PULMONARY FUNCTION TEST
DL/VA % pred: 84 %
DL/VA: 3.47 ml/min/mmHg/L
DLCO cor % pred: 72 %
DLCO cor: 13.99 ml/min/mmHg
DLCO unc % pred: 77 %
DLCO unc: 15.02 ml/min/mmHg
FEF 25-75 Post: 1.13 L/sec
FEF 25-75 Pre: 0.75 L/sec
FEF2575-%Change-Post: 52 %
FEF2575-%Pred-Post: 62 %
FEF2575-%Pred-Pre: 41 %
FEV1-%Change-Post: 13 %
FEV1-%Pred-Post: 78 %
FEV1-%Pred-Pre: 69 %
FEV1-Post: 1.72 L
FEV1-Pre: 1.52 L
FEV1FVC-%Change-Post: 13 %
FEV1FVC-%Pred-Pre: 83 %
FEV6-%Change-Post: 0 %
FEV6-%Pred-Post: 87 %
FEV6-%Pred-Pre: 86 %
FEV6-Post: 2.43 L
FEV6-Pre: 2.42 L
FEV6FVC-%Change-Post: 0 %
FEV6FVC-%Pred-Post: 104 %
FEV6FVC-%Pred-Pre: 104 %
FVC-%Change-Post: 0 %
FVC-%Pred-Post: 83 %
FVC-%Pred-Pre: 82 %
FVC-Post: 2.43 L
FVC-Pre: 2.43 L
Post FEV1/FVC ratio: 71 %
Post FEV6/FVC ratio: 100 %
Pre FEV1/FVC ratio: 63 %
Pre FEV6/FVC Ratio: 100 %
RV % pred: 105 %
RV: 2.35 L
TLC % pred: 93 %
TLC: 4.73 L

## 2021-08-09 NOTE — Progress Notes (Signed)
PFT done today. 

## 2021-08-10 ENCOUNTER — Other Ambulatory Visit: Payer: Self-pay | Admitting: Family Medicine

## 2021-08-10 MED ORDER — FUROSEMIDE 20 MG PO TABS
20.0000 mg | ORAL_TABLET | Freq: Every day | ORAL | 3 refills | Status: AC | PRN
Start: 2021-08-10 — End: ?

## 2021-08-10 NOTE — Progress Notes (Signed)
Prescription for Lasix sent to pharmacy as discussed at last [place].  To be used PRN for swelling

## 2021-08-13 ENCOUNTER — Other Ambulatory Visit: Payer: Self-pay | Admitting: Student

## 2021-08-13 ENCOUNTER — Encounter (HOSPITAL_COMMUNITY): Payer: Self-pay | Admitting: Student

## 2021-08-14 LAB — SARS CORONAVIRUS 2 (TAT 6-24 HRS): SARS Coronavirus 2: NEGATIVE

## 2021-08-15 ENCOUNTER — Other Ambulatory Visit: Payer: Self-pay

## 2021-08-15 ENCOUNTER — Encounter (HOSPITAL_COMMUNITY): Payer: Self-pay | Admitting: Student

## 2021-08-15 NOTE — Progress Notes (Signed)
.  PCP - Birdie Riddle Cardiologist - na EKG - na Chest x-ray - 11/22 ECHO - na Cardiac Cath - na CPAP - na  ERAS Protcol - na COVID TEST-  08-13-21 negative  Anesthesia review: na  -------------  SDW INSTRUCTIONS:  Your procedure is scheduled on 08/17/21. Please report to [facility] Main Entrance "A" at 0530 A.M., and check in at the Admitting office. Call this number if you have problems the morning of surgery: [phone]   Remember: Do not eat or drink after midnight the night before your surgery    Medications to take morning of surgery with a sip of water include: Levothyroxine, claritin, prn meclizine  As of today, STOP taking any Aspirin (unless otherwise instructed by your surgeon), Aleve, Naproxen, Ibuprofen, Motrin, Advil, Goody's, BC's, all herbal medications, fish oil, and all vitamins.    The Morning of Surgery Do not wear jewelry, make-up or nail polish. Do not wear lotions, powders, or perfumes/colognes, or deodorant Do not bring valuables to the hospital. [facility] is not responsible for any belongings or valuables.  If you are a smoker, DO NOT Smoke 24 hours prior to surgery  If you wear a CPAP at night please bring your mask the morning of surgery   Remember that you must have someone to transport you home after your surgery, and remain with you for 24 hours if you are discharged the same day.  Please bring cases for contacts, glasses, hearing aids, dentures or bridgework because it cannot be worn into surgery.   Patients discharged the day of surgery will not be allowed to drive home.   Please shower the NIGHT BEFORE/MORNING OF SURGERY (use antibacterial soap like DIAL soap if possible). Wear comfortable clothes the morning of surgery. Oral Hygiene is also important to reduce your risk of infection.  Remember - BRUSH YOUR TEETH THE MORNING OF SURGERY WITH YOUR REGULAR TOOTHPASTE  Patient denies shortness of breath, fever, cough and chest pain.

## 2021-08-16 ENCOUNTER — Telehealth: Payer: Self-pay | Admitting: Family Medicine

## 2021-08-16 NOTE — Telephone Encounter (Signed)
Opened in error

## 2021-08-17 ENCOUNTER — Telehealth: Payer: Self-pay | Admitting: Student

## 2021-08-17 ENCOUNTER — Encounter (HOSPITAL_COMMUNITY): Payer: Self-pay | Admitting: Student

## 2021-08-17 ENCOUNTER — Encounter (HOSPITAL_COMMUNITY)
Admission: RE | Disposition: A | Discharge status: Home / Self Care | Payer: Self-pay | Source: Home / Self Care | Attending: Student

## 2021-08-17 ENCOUNTER — Other Ambulatory Visit (HOSPITAL_COMMUNITY): Payer: Self-pay

## 2021-08-17 ENCOUNTER — Ambulatory Visit (HOSPITAL_COMMUNITY): Payer: Medicare Other | Admitting: Anesthesiology

## 2021-08-17 ENCOUNTER — Ambulatory Visit (HOSPITAL_COMMUNITY)
Admission: RE | Admit: 2021-08-17 | Discharge: 2021-08-17 | Disposition: A | Discharge status: Home / Self Care | Payer: Medicare Other | Attending: Student | Admitting: Student

## 2021-08-17 ENCOUNTER — Ambulatory Visit: Payer: Medicare Other | Admitting: Student

## 2021-08-17 DIAGNOSIS — J479 Bronchiectasis, uncomplicated: Secondary | ICD-10-CM | POA: Insufficient documentation

## 2021-08-17 DIAGNOSIS — J9811 Atelectasis: Secondary | ICD-10-CM | POA: Insufficient documentation

## 2021-08-17 DIAGNOSIS — Z79899 Other long term (current) drug therapy: Secondary | ICD-10-CM | POA: Diagnosis not present

## 2021-08-17 DIAGNOSIS — R918 Other nonspecific abnormal finding of lung field: Secondary | ICD-10-CM | POA: Diagnosis not present

## 2021-08-17 HISTORY — PX: HEMOSTASIS CONTROL: SHX6838

## 2021-08-17 HISTORY — DX: Hypothyroidism, unspecified: E03.9

## 2021-08-17 HISTORY — PX: BRONCHIAL WASHINGS: SHX5105

## 2021-08-17 HISTORY — PX: VIDEO BRONCHOSCOPY: SHX5072

## 2021-08-17 HISTORY — DX: Overactive bladder: N32.81

## 2021-08-17 HISTORY — DX: Pneumonia, unspecified organism: J18.9

## 2021-08-17 LAB — BODY FLUID CELL COUNT WITH DIFFERENTIAL
Eos, Fluid: 0 %
Lymphs, Fluid: 7 %
Monocyte-Macrophage-Serous Fluid: 90 % (ref 50–90)
Neutrophil Count, Fluid: 3 % (ref 0–25)
Total Nucleated Cell Count, Fluid: 44 cu mm (ref 0–1000)

## 2021-08-17 LAB — CBC
HCT: 40.1 % (ref 36.0–46.0)
Hemoglobin: 12.6 g/dL (ref 12.0–15.0)
MCH: 30 pg (ref 26.0–34.0)
MCHC: 31.4 g/dL (ref 30.0–36.0)
MCV: 95.5 fL (ref 80.0–100.0)
Platelets: 222 10*3/uL (ref 150–400)
RBC: 4.2 MIL/uL (ref 3.87–5.11)
RDW: 13.6 % (ref 11.5–15.5)
WBC: 4.5 10*3/uL (ref 4.0–10.5)
nRBC: 0 % (ref 0.0–0.2)

## 2021-08-17 SURGERY — VIDEO BRONCHOSCOPY WITHOUT FLUORO
Anesthesia: General

## 2021-08-17 MED ORDER — PROPOFOL 10 MG/ML IV BOLUS
INTRAVENOUS | Status: DC | PRN
  Administered 2021-08-17: 100 mg via INTRAVENOUS
  Administered 2021-08-17: 50 mg via INTRAVENOUS

## 2021-08-17 MED ORDER — LIDOCAINE HCL URETHRAL/MUCOSAL 2 % EX GEL
CUTANEOUS | Status: AC
  Filled 2021-08-17: qty 6

## 2021-08-17 MED ORDER — LACTATED RINGERS IV SOLN: INTRAVENOUS | Status: DC | PRN

## 2021-08-17 MED ORDER — LACTATED RINGERS IV SOLN: INTRAVENOUS | Status: DC

## 2021-08-17 MED ORDER — SUGAMMADEX SODIUM 200 MG/2ML IV SOLN
INTRAVENOUS | Status: DC | PRN
  Administered 2021-08-17: 400 mg via INTRAVENOUS

## 2021-08-17 MED ORDER — FENTANYL CITRATE (PF) 100 MCG/2ML IJ SOLN: 25.0000 ug | INTRAMUSCULAR | Status: DC | PRN

## 2021-08-17 MED ORDER — DEXAMETHASONE SODIUM PHOSPHATE 10 MG/ML IJ SOLN
INTRAMUSCULAR | Status: DC | PRN
  Administered 2021-08-17: 10 mg via INTRAVENOUS

## 2021-08-17 MED ORDER — LIDOCAINE HCL (PF) 1 % IJ SOLN
INTRAMUSCULAR | Status: AC
  Filled 2021-08-17: qty 30

## 2021-08-17 MED ORDER — ROCURONIUM BROMIDE 10 MG/ML (PF) SYRINGE
PREFILLED_SYRINGE | INTRAVENOUS | Status: DC | PRN
  Administered 2021-08-17: 60 mg via INTRAVENOUS

## 2021-08-17 MED ORDER — LIDOCAINE 2% (20 MG/ML) 5 ML SYRINGE
INTRAMUSCULAR | Status: DC | PRN
  Administered 2021-08-17: 40 mg via INTRAVENOUS

## 2021-08-17 MED ORDER — CHLORHEXIDINE GLUCONATE 0.12 % MT SOLN
15.0000 mL | OROMUCOSAL | Status: AC
  Administered 2021-08-17: 06:00:00 15 mL via OROMUCOSAL

## 2021-08-17 MED ORDER — ONDANSETRON HCL 4 MG/2ML IJ SOLN
INTRAMUSCULAR | Status: DC | PRN
  Administered 2021-08-17: 4 mg via INTRAVENOUS

## 2021-08-17 MED ORDER — ACETAMINOPHEN 10 MG/ML IV SOLN: 1000.0000 mg | Freq: Once | INTRAVENOUS | Status: DC | PRN

## 2021-08-17 NOTE — Op Note (Signed)
Bronchoscopy Procedure Note  Karen Jones  [id number]  [birth year]  Date:08/17/21  Time:8:04 AM   Provider Performing:Ivone Licht M Verlee Monte   Procedure(s):  Flexible Bronchoscopy [phone]) and Flexible bronchoscopy with bronchial alveolar lavage (02542)  Indication(s) Bronchiectasis Atelectasis  Consent Risks of the procedure as well as the alternatives and risks of each were explained to the patient and/or caregiver.  Consent for the procedure was obtained and is signed in the bedside chart  Anesthesia General   Time Out Verified patient identification, verified procedure, site/side was marked, verified correct patient position, special equipment/implants available, medications/allergies/relevant history reviewed, required imaging and test results available.   Sterile Technique Usual hand hygiene, masks, gowns, and gloves were used   Procedure Description Bronchoscope advanced through endotracheal tube and into airway.  Airways were examined down to subsegmental level with findings noted below.   Following diagnostic evaluation, BAL(s) performed in lingula with normal saline and return of 30cc cloudy fluid. Mucosa was surprisingly friable, applied iced saline to distal left mainstem x1 with cessation of oozing mucosal bleed.  Findings:  - small quantity of mucoid secretions at takeoff of lingula, left lower lobe which were suctioned - very friable mucosa especially distal left mainstem, lingula, left lower lobe airways but otherwise no mucosal abnormalities seen. Brushing not performed given degree of friability even with scope passage. - BAL in lingula as above sent for micro, cytology  Complications/Tolerance Minor superficial mucosal bleeding requiring application of iced saline x1. Chest X-ray is not needed post procedure.   EBL Minimal   Specimen(s) BAL sent for micro, cytology

## 2021-08-17 NOTE — Telephone Encounter (Signed)
Ellwood Sayers, CPhT  Rosana Berger, CMA; Maryjane Hurter, MD Replies will be sent to P Rx Prior Auth Team Cc: Madelyn Brunner, CPhT Caller: Unspecified (Today,  8:17 AM) Symbicort, Breo and Advair Diskus are $47. Symbicort and Advair, the pharmacy just needs to do DAW 9 for brand preferred by the ins.

## 2021-08-17 NOTE — Transfer of Care (Signed)
Immediate Anesthesia Transfer of Care Note  Patient: Karen Jones  Procedure(s) Performed: VIDEO BRONCHOSCOPY WITHOUT FLUORO BRONCHIAL WASHINGS HEMOSTASIS CONTROL  Patient Location: PACU  Anesthesia Type:General  Level of Consciousness: drowsy and patient cooperative  Airway & Oxygen Therapy: Patient Spontanous Breathing  Post-op Assessment: Report given to RN and Post -op Vital signs reviewed and stable  Post vital signs: Reviewed and stable  Last Vitals:  Vitals Value Taken Time  BP 127/87 08/17/21 0813  Temp    Pulse 79 08/17/21 0814  Resp 16 08/17/21 0814  SpO2 95 % 08/17/21 0814  Vitals shown include unvalidated device data.  Last Pain:  Vitals:   08/17/21 0621  TempSrc:   PainSc: 0-No pain      Patients Stated Pain Goal: 0 (69/45/03 8882)  Complications: No notable events documented.

## 2021-08-17 NOTE — Telephone Encounter (Signed)
Routing for f/u.

## 2021-08-17 NOTE — Anesthesia Preprocedure Evaluation (Addendum)
Anesthesia Evaluation  Patient identified by MRN, date of birth, ID band Patient awake    Reviewed: Allergy & Precautions, NPO status , Patient's Chart, lab work & pertinent test results  Airway Mallampati: II  TM Distance: >3 FB Neck ROM: Full    Dental no notable dental hx.    Pulmonary  Hilar mass   Pulmonary exam normal        Cardiovascular negative cardio ROS   Rhythm:Regular Rate:Normal     Neuro/Psych negative neurological ROS  negative psych ROS   GI/Hepatic negative GI ROS, Neg liver ROS,   Endo/Other  Hypothyroidism   Renal/GU negative Renal ROS  negative genitourinary   Musculoskeletal negative musculoskeletal ROS (+)   Abdominal Normal abdominal exam  (+)   Peds  Hematology  (+) anemia ,   Anesthesia Other Findings   Reproductive/Obstetrics                            Anesthesia Physical Anesthesia Plan  ASA: 2  Anesthesia Plan: General   Post-op Pain Management:    Induction: Intravenous  PONV Risk Score and Plan: 3 and Ondansetron, Dexamethasone and Treatment may vary due to age or medical condition  Airway Management Planned: Mask and Oral ETT  Additional Equipment: None  Intra-op Plan:   Post-operative Plan: Extubation in OR  Informed Consent: I have reviewed the patients History and Physical, chart, labs and discussed the procedure including the risks, benefits and alternatives for the proposed anesthesia with the patient or authorized representative who has indicated his/her understanding and acceptance.     Dental advisory given  Plan Discussed with: CRNA  Anesthesia Plan Comments: (Lab Results      Component                Value               Date                      WBC                      5.0                 06/09/2021                HGB                      13.1                06/09/2021                HCT                      40.9                 06/09/2021                MCV                      92.5                06/09/2021                PLT                      313.0  06/09/2021           Lab Results      Component                Value               Date                      NA                       143                 06/09/2021                K                        4.1                 06/09/2021                CO2                      26                  06/09/2021                GLUCOSE                  89                  06/09/2021                BUN                      17                  06/09/2021                CREATININE               0.96                06/09/2021                CALCIUM                  9.4                 06/09/2021          )       Anesthesia Quick Evaluation

## 2021-08-17 NOTE — Interval H&P Note (Signed)
History and Physical Interval Note:  08/17/2021 7:34 AM  Karen Jones  has presented today for surgery, with the diagnosis of l hilar mass.  The various methods of treatment have been discussed with the patient and family. After consideration of risks, benefits and other options for treatment, the patient has consented to  Procedure(s): VIDEO BRONCHOSCOPY WITHOUT FLUORO (N/A) as a surgical intervention.  The patient's history has been reviewed, patient examined, no change in status, stable for surgery.  I have reviewed the patient's chart and labs.  Questions were answered to the patient's satisfaction.     Maryjane Hurter

## 2021-08-17 NOTE — Discharge Instructions (Addendum)
-   I do think you could benefit from having an inhaler based on your PFT results. I have sent a message to our pharmacy staff to help [place] figure out which ICS (inhaled corticosteroid)/LABA (long-acting beta agonist) inhaler is most affordable based on your insurance coverage. Once I find out what's most affordable, I'll send a message with instructions for use and order the prescription. - If you start coughing up half a cup or so with frank blood let us know and it would be worth coming into the ED - Some results will be available in 3-5 business days but probably the most pertinent results for you (fungal culture and AFB culture) can take as long as 6 weeks to be finalized. Call or send a message if you have any questions about your results.  - Keep follow up appointment with ID 08/27/21, appointment with me 09/08/21

## 2021-08-17 NOTE — Telephone Encounter (Signed)
Can you send a message to our pharmacy staff to ask which ICS/LABA is most affordable for Ms. Karen Jones?  Thanks!!

## 2021-08-17 NOTE — Anesthesia Procedure Notes (Signed)
Procedure Name: Intubation Date/Time: 08/17/2021 7:55 AM Performed by: Georgia Duff, CRNA Pre-anesthesia Checklist: Patient identified, Emergency Drugs available, Suction available and Patient being monitored Patient Re-evaluated:Patient Re-evaluated prior to induction Oxygen Delivery Method: Circle System Utilized Preoxygenation: Pre-oxygenation with 100% oxygen Induction Type: IV induction Ventilation: Mask ventilation without difficulty Laryngoscope Size: Bleich and 2 Grade View: Grade II Tube type: Oral Tube size: 8.5 mm Number of attempts: 1 Airway Equipment and Method: Stylet and Oral airway Placement Confirmation: ETT inserted through vocal cords under direct vision, positive ETCO2 and breath sounds checked- equal and bilateral Secured at: 22 cm Tube secured with: Tape Dental Injury: Teeth and Oropharynx as per pre-operative assessment

## 2021-08-17 NOTE — Anesthesia Postprocedure Evaluation (Signed)
Anesthesia Post Note  Patient: [name]  Procedure(s) Performed: VIDEO BRONCHOSCOPY WITHOUT FLUORO BRONCHIAL WASHINGS HEMOSTASIS CONTROL     Patient location during evaluation: PACU Anesthesia Type: General Level of consciousness: awake and alert Pain management: pain level controlled Vital Signs Assessment: post-procedure vital signs reviewed and stable Respiratory status: spontaneous breathing, nonlabored ventilation, respiratory function stable and patient connected to nasal cannula oxygen Cardiovascular status: blood pressure returned to baseline and stable Postop Assessment: no apparent nausea or vomiting Anesthetic complications: no   No notable events documented.  Last Vitals:  Vitals:   08/17/21 0830 08/17/21 0843  BP:  119/60  Pulse: 75 71  Resp: (!) 21 18  Temp:  36.5 C  SpO2: 95% 94%    Last Pain:  Vitals:   08/17/21 0843  TempSrc:   PainSc: 0-No pain                 March Rummage Khylie Larmore

## 2021-08-17 NOTE — Telephone Encounter (Signed)
Routing to pharm team. Please advise on ICS/LABA most affordable for the pt, thanks!

## 2021-08-18 ENCOUNTER — Encounter (HOSPITAL_COMMUNITY): Payer: Self-pay | Admitting: Student

## 2021-08-18 LAB — CYTOLOGY - NON PAP

## 2021-08-18 LAB — ACID FAST SMEAR (AFB, MYCOBACTERIA): Acid Fast Smear: NEGATIVE

## 2021-08-19 ENCOUNTER — Ambulatory Visit (INDEPENDENT_AMBULATORY_CARE_PROVIDER_SITE_OTHER): Payer: Medicare Other

## 2021-08-19 ENCOUNTER — Telehealth: Payer: Self-pay | Admitting: Student

## 2021-08-19 VITALS — BP 118/60 | HR 76 | Temp 98.0°F | Ht 64.0 in | Wt 193.0 lb

## 2021-08-19 DIAGNOSIS — Z0000 Encounter for general adult medical examination without abnormal findings: Secondary | ICD-10-CM

## 2021-08-19 NOTE — Patient Instructions (Signed)
Karen Jones , Thank you for taking time to come for your Medicare Wellness Visit. I appreciate your ongoing commitment to your health goals. Please review the following plan we discussed and let me know if I can assist you in the future.   Screening recommendations/referrals: Colonoscopy: Scheduled consult 08/27/2021 Mammogram: 01/22/2021 Bone Density: 07/30/2020 Recommended yearly ophthalmology/optometry visit for glaucoma screening and checkup Recommended yearly dental visit for hygiene and checkup  Vaccinations: Influenza vaccine: completed  Pneumococcal vaccine: completed  Tdap vaccine: 03/27/2015 Shingles vaccine: completed     Advanced directives: yes  Conditions/risks identified: none   Next appointment: none   Preventive Care 73 Years and Older, Female Preventive care refers to lifestyle choices and visits with your health care provider that can promote health and wellness. What does preventive care include? A yearly physical exam. This is also called an annual well check. Dental exams once or twice a year. Routine eye exams. Ask your health care provider how often you should have your eyes checked. Personal lifestyle choices, including: Daily care of your teeth and gums. Regular physical activity. Eating a healthy diet. Avoiding tobacco and drug use. Limiting alcohol use. Practicing safe sex. Taking low-dose aspirin every day. Taking vitamin and mineral supplements as recommended by your health care provider. What happens during an annual well check? The services and screenings done by your health care provider during your annual well check will depend on your age, overall health, lifestyle risk factors, and family history of disease. Counseling  Your health care provider may ask you questions about your: Alcohol use. Tobacco use. Drug use. Emotional well-being. Home and relationship well-being. Sexual activity. Eating habits. History of falls. Memory and  ability to understand (cognition). Work and work Statistician. Reproductive health. Screening  You may have the following tests or measurements: Height, weight, and BMI. Blood pressure. Lipid and cholesterol levels. These may be checked every 5 years, or more frequently if you are over 101 years old. Skin check. Lung cancer screening. You may have this screening every year starting at age 74 if you have a 30-pack-year history of smoking and currently smoke or have quit within the past 15 years. Fecal occult blood test (FOBT) of the stool. You may have this test every year starting at age 4. Flexible sigmoidoscopy or colonoscopy. You may have a sigmoidoscopy every 5 years or a colonoscopy every 10 years starting at age 37. Hepatitis C blood test. Hepatitis B blood test. Sexually transmitted disease (STD) testing. Diabetes screening. This is done by checking your blood sugar (glucose) after you have not eaten for a while (fasting). You may have this done every 1-3 years. Bone density scan. This is done to screen for osteoporosis. You may have this done starting at age 64. Mammogram. This may be done every 1-2 years. Talk to your health care provider about how often you should have regular mammograms. Talk with your health care provider about your test results, treatment options, and if necessary, the need for more tests. Vaccines  Your health care provider may recommend certain vaccines, such as: Influenza vaccine. This is recommended every year. Tetanus, diphtheria, and acellular pertussis (Tdap, Td) vaccine. You may need a Td booster every 10 years. Zoster vaccine. You may need this after age 52. Pneumococcal 13-valent conjugate (PCV13) vaccine. One dose is recommended after age 32. Pneumococcal polysaccharide (PPSV23) vaccine. One dose is recommended after age 78. Talk to your health care provider about which screenings and vaccines you need and how often you  need them. This information is  not intended to replace advice given to you by your health care provider. Make sure you discuss any questions you have with your health care provider. Document Released: 08/07/2015 Document Revised: 03/30/2016 Document Reviewed: 05/12/2015 Elsevier Interactive Patient Education  2017 [place] Prevention in the Home Falls can cause injuries. They can happen to people of all ages. There are many things you can do to make your home safe and to help prevent falls. What can I do on the outside of my home? Regularly fix the edges of walkways and driveways and fix any cracks. Remove anything that might make you trip as you walk through a door, such as a raised step or threshold. Trim any bushes or trees on the path to your home. Use bright outdoor lighting. Clear any walking paths of anything that might make someone trip, such as rocks or tools. Regularly check to see if handrails are loose or broken. Make sure that both sides of any steps have handrails. Any raised decks and porches should have guardrails on the edges. Have any leaves, snow, or ice cleared regularly. Use sand or salt on walking paths during winter. Clean up any spills in your garage right away. This includes oil or grease spills. What can I do in the bathroom? Use night lights. Install grab bars by the toilet and in the tub and shower. Do not use towel bars as grab bars. Use non-skid mats or decals in the tub or shower. If you need to sit down in the shower, use a plastic, non-slip stool. Keep the floor dry. Clean up any water that spills on the floor as soon as it happens. Remove soap buildup in the tub or shower regularly. Attach bath mats securely with double-sided non-slip rug tape. Do not have throw rugs and other things on the floor that can make you trip. What can I do in the bedroom? Use night lights. Make sure that you have a light by your bed that is easy to reach. Do not use any sheets or blankets that  are too big for your bed. They should not hang down onto the floor. Have a firm chair that has side arms. You can use this for support while you get dressed. Do not have throw rugs and other things on the floor that can make you trip. What can I do in the kitchen? Clean up any spills right away. Avoid walking on wet floors. Keep items that you use a lot in easy-to-reach places. If you need to reach something above you, use a strong step stool that has a grab bar. Keep electrical cords out of the way. Do not use floor polish or wax that makes floors slippery. If you must use wax, use non-skid floor wax. Do not have throw rugs and other things on the floor that can make you trip. What can I do with my stairs? Do not leave any items on the stairs. Make sure that there are handrails on both sides of the stairs and use them. Fix handrails that are broken or loose. Make sure that handrails are as long as the stairways. Check any carpeting to make sure that it is firmly attached to the stairs. Fix any carpet that is loose or worn. Avoid having throw rugs at the top or bottom of the stairs. If you do have throw rugs, attach them to the floor with carpet tape. Make sure that you have a light switch  at the top of the stairs and the bottom of the stairs. If you do not have them, ask someone to add them for you. What else can I do to help prevent falls? Wear shoes that: Do not have high heels. Have rubber bottoms. Are comfortable and fit you well. Are closed at the toe. Do not wear sandals. If you use a stepladder: Make sure that it is fully opened. Do not climb a closed stepladder. Make sure that both sides of the stepladder are locked into place. Ask someone to hold it for you, if possible. Clearly mark and make sure that you can see: Any grab bars or handrails. First and last steps. Where the edge of each step is. Use tools that help you move around (mobility aids) if they are needed. These  include: Canes. Walkers. Scooters. Crutches. Turn on the lights when you go into a dark area. Replace any light bulbs as soon as they burn out. Set up your furniture so you have a clear path. Avoid moving your furniture around. If any of your floors are uneven, fix them. If there are any pets around you, be aware of where they are. Review your medicines with your doctor. Some medicines can make you feel dizzy. This can increase your chance of falling. Ask your doctor what other things that you can do to help prevent falls. This information is not intended to replace advice given to you by your health care provider. Make sure you discuss any questions you have with your health care provider. Document Released: 05/07/2009 Document Revised: 12/17/2015 Document Reviewed: 08/15/2014 Elsevier Interactive Patient Education  2017 [employer].

## 2021-08-19 NOTE — Progress Notes (Signed)
Subjective:   Karen Jones is a 71 y.o. female who presents for Medicare Annual (Subsequent) preventive examination.  Review of Systems     Cardiac Risk Factors include: advanced age (>24men, >61 women)     Objective:    Today's Vitals   08/19/21 1333  BP: 118/60  Pulse: 76  Temp: 98 F (36.7 C)  SpO2: 96%  Weight: 193 lb (87.5 kg)  Height: 5\' 4"  (1.626 m)   Body mass index is 33.13 kg/m.  Advanced Directives 08/19/2021 08/17/2021 06/08/2020 06/03/2019 05/23/2018 05/18/2017  Does Patient Have a Medical Advance Directive? Yes No Yes Yes Yes Yes  Type of Paramedic of [place];Living will - Living will [place];Living will Living will Living will  Copy of [place] in Chart? No - copy requested - - Yes - validated most recent copy scanned in chart (See row information) - -  Would patient like information on creating a medical advance directive? - No - Patient declined - - - -    Current Medications (verified) Outpatient Encounter Medications as of 08/19/2021  Medication Sig   cholecalciferol (VITAMIN D3) 25 MCG (1000 UNIT) tablet Take 1,000 Units by mouth daily.   furosemide (LASIX) 20 MG tablet Take 1 tablet (20 mg total) by mouth daily as needed.   ibuprofen (ADVIL) 200 MG tablet Take 200-400 mg by mouth every 8 (eight) hours as needed for moderate pain.   levothyroxine (SYNTHROID) 75 MCG tablet TAKE 1 TABLET (75 MCG TOTAL) BY MOUTH DAILY WITH BREAKFAST.   loratadine (CLARITIN) 10 MG tablet Take 10 mg by mouth daily.   meclizine (ANTIVERT) 25 MG tablet Take 1 tablet (25 mg total) by mouth 3 (three) times daily as needed for dizziness.   tretinoin (RETIN-A) 0.05 % cream Apply 1 application topically at bedtime.   No facility-administered encounter medications on file as of 08/19/2021.    Allergies (verified) Penicillins and Levofloxacin   History: Past Medical History:  Diagnosis Date   Allergy     Anemia    Benign positional vertigo    Cancer (HCC)    skin   Colon polyps    Hypothyroidism    OAB (overactive bladder)    Pain in limb    Pneumonia    Thyroid disease    Past Surgical History:  Procedure Laterality Date   [place]   BRONCHIAL WASHINGS  08/17/2021   Procedure: BRONCHIAL WASHINGS;  Surgeon: Maryjane Hurter, MD;  Location: [facility] ENDOSCOPY;  Service: Pulmonary;;   COLONOSCOPY  1995,1997,2001,2005,2010   polyps removed every colonoscopy except 1 time,per pt   HAND SURGERY     to remove webbing between fingers    HEMOSTASIS CONTROL  08/17/2021   Procedure: HEMOSTASIS CONTROL;  Surgeon: Maryjane Hurter, MD;  Location: [facility] ENDOSCOPY;  Service: Pulmonary;;   POLYPECTOMY     TONSILLECTOMY     TUBAL LIGATION     UMBILICAL HERNIA REPAIR     VIDEO BRONCHOSCOPY N/A 08/17/2021   Procedure: VIDEO BRONCHOSCOPY WITHOUT FLUORO;  Surgeon: Maryjane Hurter, MD;  Location: [facility] ENDOSCOPY;  Service: Pulmonary;  Laterality: N/A;   Family History  Problem Relation Age of Onset   Emphysema Mother    Colon cancer Father 73   Cancer Father    Heart disease Father    Heart attack Father    Colon polyps Brother    Breast cancer Neg Hx  Esophageal cancer Neg Hx    Rectal cancer Neg Hx    Stomach cancer Neg Hx    Social History   Socioeconomic History   Marital status: Married    Spouse name: Not on file   Number of children: Not on file   Years of education: Not on file   Highest education level: Not on file  Occupational History   Occupation: Retired  Tobacco Use   Smoking status: Never   Smokeless tobacco: Never  Vaping Use   Vaping Use: Never used  Substance and Sexual Activity   Alcohol use: Yes    Alcohol/week: 5.0 standard drinks    Types: 3 Glasses of wine, 2 Shots of liquor per week   Drug use: No   Sexual activity: Not Currently  Other Topics Concern   Not on file  Social History Narrative   Not on file    Social Determinants of Health   Financial Resource Strain: Low Risk    Difficulty of Paying Living Expenses: Not hard at all  Food Insecurity: No Food Insecurity   Worried About Charity fundraiser in the Last Year: Never true   Ran Out of Food in the Last Year: Never true  Transportation Needs: No Transportation Needs   Lack of Transportation (Medical): No   Lack of Transportation (Non-Medical): No  Physical Activity: Insufficiently Active   Days of Exercise per Week: 3 days   Minutes of Exercise per Session: 40 min  Stress: No Stress Concern Present   Feeling of Stress : Not at all  Social Connections: Socially Integrated   Frequency of Communication with Friends and Family: Twice a week   Frequency of Social Gatherings with Friends and Family: Twice a week   Attends Religious Services: More than 4 times per year   Active Member of [employer] or Organizations: Yes   Attends Music therapist: More than 4 times per year   Marital Status: Married    Tobacco Counseling Counseling given: Not Answered   Clinical Intake:  Pre-visit preparation completed: Yes  Pain : No/denies pain     Nutritional Risks: None Diabetes: No  How often do you need to have someone help you when you read instructions, pamphlets, or other written materials from your doctor or pharmacy?: 1 - Never What is the last grade level you completed in school?: college  Diabetic?no   Interpreter Needed?: No  Information entered by :: L.Averiana Clouatre,LPN   Activities of Daily Living In your present state of health, do you have any difficulty performing the following activities: 08/19/2021 08/18/2021  Hearing? N N  Vision? N N  Difficulty concentrating or making decisions? N N  Walking or climbing stairs? N N  Dressing or bathing? N N  Doing errands, shopping? N N  Preparing Food and eating ? N N  Using the Toilet? N N  In the past six months, have you accidently leaked urine? N Y  Do you have  problems with loss of bowel control? N N  Managing your Medications? N N  Managing your Finances? N N  Housekeeping or managing your Housekeeping? N N  Some recent data might be hidden    Patient Care Team: Midge Minium, MD as PCP - General (Family Medicine) Loletha Carrow Kirke Corin, MD as Consulting Physician (Gastroenterology) Linus Mako, MD as Consulting Physician (Family Medicine) Lubertha Sayres, MD as Referring Physician (Dermatology) Manson Passey, [place] (Specialist) Madelin Rear, [facility] as Pharmacist (Pharmacist)  Indicate any recent Medical  Services you may have received from other than [facility] providers in the past year (date may be approximate).     Assessment:   This is a routine wellness examination for [place].  Hearing/Vision screen Vision Screening - Comments:: Every 2 years wear reading glasses   Dietary issues and exercise activities discussed: Current Exercise Habits: Home exercise routine, Type of exercise: walking, Time (Minutes): 40, Frequency (Times/Week): 3, Weekly Exercise (Minutes/Week): 120, Intensity: Mild, Exercise limited by: None identified   Goals Addressed   None    Depression Screen PHQ 2/9 Scores 08/19/2021 08/19/2021 06/16/2021 06/09/2021 05/14/2021 06/08/2020 06/08/2020  PHQ - 2 Score 0 0 0 0 0 0 0  PHQ- 9 Score - - - 0 2 0 -    Fall Risk Fall Risk  08/19/2021 08/18/2021 06/16/2021 06/09/2021 05/14/2021  Falls in the past year? 0 0 0 - 0  Comment - - - - -  Number falls in past yr: 0 0 0 - 0  Comment - - - - -  Injury with Fall? 0 0 0 - 0  Risk for fall due to : No Fall Risks - - No Fall Risks No Fall Risks  Follow up Falls evaluation completed - - Falls evaluation completed Falls evaluation completed    FALL RISK PREVENTION PERTAINING TO THE HOME:  Any stairs in or around the home? No  If so, are there any without handrails? No  Home free of loose throw rugs in walkways, pet beds, electrical cords, etc? Yes  Adequate lighting in your home to  reduce risk of falls? Yes   ASSISTIVE DEVICES UTILIZED TO PREVENT FALLS:  Life alert? No  Use of a cane, walker or w/c? No  Grab bars in the bathroom? Yes  Shower chair or bench in shower? Yes  Elevated toilet seat or a handicapped toilet? Yes   TIMED UP AND GO:  Was the test performed? Yes .  Length of time to ambulate 10 feet: 7 sec.   Gait steady and fast without use of assistive device  Cognitive Function:Normal cognitive status assessed by direct observation by this Nurse Health Advisor. No abnormalities found.   MMSE - Mini Mental State Exam 06/03/2019 05/23/2018  Orientation to time 5 5  Orientation to Place 5 5  Registration 3 3  Attention/ Calculation 5 5  Recall 2 2  Language- name 2 objects 2 2  Language- repeat 1 1  Language- follow 3 step command 3 3  Language- read & follow direction 1 1  Write a sentence 1 1  Copy design 1 1  Total score 29 29        Immunizations Immunization History  Administered Date(s) Administered   Hepatitis A 03/09/2011, 09/21/2011   Hepatitis B 01/05/2011, 03/09/2011, 03/20/2012   Influenza, High Dose Seasonal PF 05/23/2018, 04/18/2019   Influenza,inj,Quad PF,6+ Mos 04/21/2016, 04/26/2017   Influenza-Unspecified 04/10/2014, 04/14/2015, 04/09/2020, 06/03/2021   PFIZER(Purple Top)SARS-COV-2 Vaccination 08/29/2019, 09/23/2019, 05/04/2020   Pneumococcal Conjugate-13 03/09/2015   Pneumococcal Polysaccharide-23 05/26/2016   Tdap 11/22/2004, 03/27/2015   Zoster Recombinat (Shingrix) 02/03/2018, 05/07/2018   Zoster, Live 06/24/2010    TDAP status: Up to date  Flu Vaccine status: Up to date  Pneumococcal vaccine status: Up to date  Covid-19 vaccine status: Completed vaccines  Qualifies for Shingles Vaccine? Yes   Zostavax completed Yes   Shingrix Completed?: Yes  Screening Tests Health Maintenance  Topic Date Due   COVID-19 Vaccine (4 - Booster for Pfizer series) 06/29/2020   COLONOSCOPY (Pts  45-80yrs Insurance coverage  will need to be confirmed)  07/09/2021   MAMMOGRAM  01/22/2022   TETANUS/TDAP  03/26/2025   Pneumonia Vaccine 81+ Years old  Completed   INFLUENZA VACCINE  Completed   DEXA SCAN  Completed   Hepatitis C Screening  Completed   Zoster Vaccines- Shingrix  Completed   HPV VACCINES  Aged Out    Health Maintenance  Health Maintenance Due  Topic Date Due   COVID-19 Vaccine (4 - Booster for [place] series) 06/29/2020   COLONOSCOPY (Pts 45-60yrs Insurance coverage will need to be confirmed)  07/09/2021    Colorectal cancer screening: Referral to GI placed scheduled 08/27/2021 consultation . Pt aware the office will call re: appt.  Mammogram status: Completed 01/22/2021. Repeat every year  Bone Density status: Completed 07/30/2020. Results reflect: Bone density results: OSTEOPENIA. Repeat every 5 years.  Lung Cancer Screening: (Low Dose CT Chest recommended if Age 54-80 years, 30 pack-year currently smoking OR have quit w/in 15years.) does not qualify.   Lung Cancer Screening Referral: n/a  Additional Screening:  Hepatitis C Screening: does not qualify; Completed 05/25/2018  Vision Screening: Recommended annual ophthalmology exams for early detection of glaucoma and other disorders of the eye. Is the patient up to date with their annual eye exam?  Yes  Who is the provider or what is the name of the office in which the patient attends annual eye exams? Dr.Barts  If pt is not established with a provider, would they like to be referred to a provider to establish care? No .   Dental Screening: Recommended annual dental exams for proper oral hygiene  Community Resource Referral / Chronic Care Management: CRR required this visit?  No   CCM required this visit?  No      Plan:     I have personally reviewed and noted the following in the patients chart:   Medical and social history Use of alcohol, tobacco or illicit drugs  Current medications and supplements including opioid  prescriptions.  Functional ability and status Nutritional status Physical activity Advanced directives List of other physicians Hospitalizations, surgeries, and ER visits in previous 12 months Vitals Screenings to include cognitive, depression, and falls Referrals and appointments  In addition, I have reviewed and discussed with patient certain preventive protocols, quality metrics, and best practice recommendations. A written personalized care plan for preventive services as well as general preventive health recommendations were provided to patient.     Randel Pigg, LPN   07/30/2692   Nurse Notes: none

## 2021-08-19 NOTE — Telephone Encounter (Signed)
08/19/2021 10:14 AM EST by Carron Curie 08/19/2021 10:14 AM EST by Tyler Aas, Adylene Slawter (Self) [phone] (Home) [phone] (Home)   - patient calling to inquire about an inhaler that NM was checking into for her. Please advise     Dr. Verlee Monte, please advise if you have an update.

## 2021-08-19 NOTE — Telephone Encounter (Signed)
Please refer to encounter from 12/4. 

## 2021-08-21 ENCOUNTER — Encounter: Payer: Self-pay | Admitting: Student

## 2021-08-22 LAB — AEROBIC/ANAEROBIC CULTURE W GRAM STAIN (SURGICAL/DEEP WOUND)
Culture: NO GROWTH
Gram Stain: NONE SEEN

## 2021-08-22 NOTE — Telephone Encounter (Signed)
Sent my chart message discussing bronch findings, results and inhaler options

## 2021-08-23 MED ORDER — BUDESONIDE-FORMOTEROL FUMARATE 80-4.5 MCG/ACT IN AERO: 2.0000 | INHALATION_SPRAY | Freq: Two times a day (BID) | RESPIRATORY_TRACT | 12 refills | Status: DC

## 2021-08-23 NOTE — Telephone Encounter (Signed)
Dr Darien Ramus, please see email that pt sent and also note from pharm team regarding inhalers for her, thanks!   Ellwood Sayers, CPhT  Rosana Berger, CMA; Maryjane Hurter, MD Replies will be sent to P Rx Prior Auth Team Cc: Madelyn Brunner, CPhT Caller: Unspecified (Today,  8:17 AM) Symbicort, Breo and Advair Diskus are $47. Symbicort and Advair, the pharmacy just needs to do DAW 9 for brand preferred by the ins.    [facility] Pool (supporting Maryjane Hurter, MD) 2 days ago   I called on Thursday and left message to nurse about my inhaler you were checking on and haven't heard back yet. Also, I would like to know if I am reading my test results correctly.  It looks like you couldn't brush my lungs because they were too fragile and you had to stop some bleeding?  I understood on Tuesday after my procedure that you found blood in the lungs and were surprised.  So, was the blood there when you got in there or did it occur while you were doing cultures?  I think it must be good news that there isn't any bacterial infection in the lungs, right?  I may not be reading the information correctly and you said if I had any questions, I could ask.  What would make my lungs too fragile to brush or is that something we are still waiting on the results? Thank you for any help on clarifying this.

## 2021-08-23 NOTE — Telephone Encounter (Signed)
Symbicort 80 2 puffs twice daily prescribed to [facility] in [place]. Needs to rinse mouth after each use

## 2021-08-23 NOTE — Telephone Encounter (Signed)
ATC LVMTCB X1  

## 2021-08-23 NOTE — Telephone Encounter (Signed)
Dr. Verlee Monte which inhaler would you like [place] to order for her?

## 2021-08-24 NOTE — Telephone Encounter (Signed)
Called and left detailed message with recs from Dr. Verlee Monte regarding the symbicort. Also sent MyChart message to pt informing her of the same information.

## 2021-08-27 ENCOUNTER — Ambulatory Visit: Payer: Medicare Other | Admitting: Internal Medicine

## 2021-08-27 ENCOUNTER — Ambulatory Visit (INDEPENDENT_AMBULATORY_CARE_PROVIDER_SITE_OTHER): Payer: Medicare Other | Admitting: Gastroenterology

## 2021-08-27 ENCOUNTER — Encounter: Payer: Self-pay | Admitting: Gastroenterology

## 2021-08-27 VITALS — BP 126/66 | HR 70 | Ht 64.0 in | Wt 192.0 lb

## 2021-08-27 DIAGNOSIS — Z800 Family history of malignant neoplasm of digestive organs: Secondary | ICD-10-CM | POA: Diagnosis not present

## 2021-08-27 DIAGNOSIS — K625 Hemorrhage of anus and rectum: Secondary | ICD-10-CM | POA: Diagnosis not present

## 2021-08-27 DIAGNOSIS — Z86010 Personal history of colonic polyps: Secondary | ICD-10-CM

## 2021-08-27 MED ORDER — PLENVU 140 G PO SOLR: 140.0000 g | ORAL | 0 refills | Status: DC

## 2021-08-27 NOTE — Patient Instructions (Signed)
If you are age 73 or older, your body mass index should be between 23-30. Your Body mass index is 32.96 kg/m. If this is out of the aforementioned range listed, please consider follow up with your Primary Care Provider.  If you are age 75 or younger, your body mass index should be between 19-25. Your Body mass index is 32.96 kg/m. If this is out of the aformentioned range listed, please consider follow up with your Primary Care Provider.   ________________________________________________________  The [facility] providers would like to encourage you to use [facility] to communicate with providers for non-urgent requests or questions.  Due to long hold times on the telephone, sending your provider a message by [facility] may be a faster and more efficient way to get a response.  Please allow 48 business hours for a response.  Please remember that this is for non-urgent requests.  _______________________________________________________  Karen Jones have been scheduled for a colonoscopy. Please follow written instructions given to you at your visit today.  Please pick up your prep supplies at the pharmacy within the next 1-3 days. If you use inhalers (even only as needed), please bring them with you on the day of your procedure.  It was a pleasure to see you today!  Thank you for trusting me with your gastrointestinal care!

## 2021-08-27 NOTE — Progress Notes (Signed)
[facility] Consult Note:  History: Karen Jones 08/27/2021  Referring provider: Midge Minium, MD  Reason for consult/chief complaint: Abdominal Pain (RLQ abd pain that lasted 3 days in December along with rectal bleeding x 3 days after. Patient states she is due for a colonoscopy due to hx of polyps and family hx of colon cancer.)   Subjective  HPI: I last saw Karen Jones for a surveillance colonoscopy in December 2020 with the following indication: "Surveillance: Personal history of adenomatous polyps on last colonoscopy 5 years ago (06/2014 - 6 polyps, some TAs, one in right colon reportedly 85m and removed piecemeal). Patient also has family history of colon cancer in father"  I removed 9 tubular adenomatous polyps, none with high-grade dysplasia, 2-year recall recommended. Karen Jones scheduled to see our endoscopy nurse to schedule colonoscopy, but clinical history noted she had active pulmonary issues and CT scan raised concern for chronic infection as a source entheses perhaps atypical Mycobacterium), thus being seen by infectious disease as well.  It was felt best to bring her for an office visit to assess her medical condition prior to considering colonoscopy. Karen Jones a bronchoscopy on 08/17/2021 with the following findings: "Findings:  - small quantity of mucoid secretions at takeoff of lingula, left lower lobe which were suctioned - very friable mucosa especially distal left mainstem, lingula, left lower lobe airways but otherwise no mucosal abnormalities seen. Brushing not performed given degree of friability even with scope passage. - BAL in lingula as above sent for micro, cytology"  No apparent infection thus far, patient prescribed some inhalers.  Karen Jones me that her pulmonologist is still waiting for the results from a fungal culture, but that they felt she was well enough to go undergo an endoscopic  procedure ____________________________________   Karen Jones severe coughing for quite a while before her testing revealed findings noted above, it has significantly subsided and now is occasional and nonproductive.  She has no fever chills night sweats or weight loss.  She was concerned because she has experienced some right lower and sometimes right upper abdominal pain and she had about 3 days of rectal bleeding in December.  She has had some intermittent abdominal pain like this over years including when she saw Dr. BOlevia Perches and Karen Roysfeels like whenever procedures were done in that setting polyps were found. There has been no further bleeding since those few days in December.  ROS:  Review of Systems  Constitutional:  Negative for appetite change and unexpected weight change.  HENT:  Negative for mouth sores and voice change.   Eyes:  Negative for pain and redness.  Respiratory:  Positive for cough. Negative for shortness of breath.   Cardiovascular:  Negative for chest pain and palpitations.  Genitourinary:  Negative for dysuria and hematuria.  Musculoskeletal:  Negative for arthralgias and myalgias.  Skin:  Negative for pallor and rash.  Neurological:  Negative for weakness and headaches.  Hematological:  Negative for adenopathy.   She is breathing comfortably on room air, she says [place] and was not short of breath.  Past Medical History: Past Medical History:  Diagnosis Date   Allergy    Anemia    Benign positional vertigo    Cancer (HCC)    skin   Colon polyps    Hypothyroidism    OAB (overactive bladder)    Pain in limb    Pneumonia    Thyroid  disease      Past Surgical History: Past Surgical History:  Procedure Laterality Date   [place]   BRONCHIAL WASHINGS  08/17/2021   Procedure: BRONCHIAL WASHINGS;  Surgeon: Maryjane Hurter, MD;  Location: [facility];  Service:  Pulmonary;;   COLONOSCOPY  1995,1997,2001,2005,2010   polyps removed every colonoscopy except 1 time,per pt   HAND SURGERY     to remove webbing between fingers    HEMOSTASIS CONTROL  08/17/2021   Procedure: HEMOSTASIS CONTROL;  Surgeon: Maryjane Hurter, MD;  Location: [facility] ENDOSCOPY;  Service: Pulmonary;;   POLYPECTOMY     TONSILLECTOMY     TUBAL LIGATION     UMBILICAL HERNIA REPAIR     VIDEO BRONCHOSCOPY N/A 08/17/2021   Procedure: VIDEO BRONCHOSCOPY WITHOUT FLUORO;  Surgeon: Maryjane Hurter, MD;  Location: [facility];  Service: Pulmonary;  Laterality: N/A;     Family History: Family History  Problem Relation Age of Onset   Emphysema Mother    Colon cancer Father 75   Cancer Father    Heart disease Father    Heart attack Father    Colon polyps Brother    Breast cancer Neg Hx    Esophageal cancer Neg Hx    Rectal cancer Neg Hx    Stomach cancer Neg Hx     Social History: Social History   Socioeconomic History   Marital status: Married    Spouse name: Not on file   Number of children: Not on file   Years of education: Not on file   Highest education level: Not on file  Occupational History   Occupation: Retired  Tobacco Use   Smoking status: Never   Smokeless tobacco: Never  Vaping Use   Vaping Use: Never used  Substance and Sexual Activity   Alcohol use: Yes    Alcohol/week: 5.0 standard drinks    Types: 3 Glasses of wine, 2 Shots of liquor per week   Drug use: No   Sexual activity: Not Currently  Other Topics Concern   Not on file  Social History Narrative   Not on file   Social Determinants of Health   Financial Resource Strain: Low Risk    Difficulty of Paying Living Expenses: Not hard at all  Food Insecurity: No Food Insecurity   Worried About Charity fundraiser in the Last Year: Never true   Ran Out of Food in the Last Year: Never true  Transportation Needs: No Transportation Needs   Lack of Transportation (Medical): No   Lack of  Transportation (Non-Medical): No  Physical Activity: Insufficiently Active   Days of Exercise per Week: 3 days   Minutes of Exercise per Session: 40 min  Stress: No Stress Concern Present   Feeling of Stress : Not at all  Social Connections: Socially Integrated   Frequency of Communication with Friends and Family: Twice a week   Frequency of Social Gatherings with Friends and Family: Twice a week   Attends Religious Services: More than 4 times per year   Active Member of [employer] or Organizations: Yes   Attends Archivist Meetings: More than 4 times per year   Marital Status: Married    Allergies: Allergies  Allergen Reactions   Penicillins Shortness Of Breath and Rash    rash, difficulty breathing   Levofloxacin     Myalgias (intolerance)    Outpatient Meds: Current Outpatient Medications  Medication Sig Dispense Refill  cholecalciferol (VITAMIN D3) 25 MCG (1000 UNIT) tablet Take 1,000 Units by mouth daily.     furosemide (LASIX) 20 MG tablet Take 1 tablet (20 mg total) by mouth daily as needed. 30 tablet 3   ibuprofen (ADVIL) 200 MG tablet Take 200-400 mg by mouth every 8 (eight) hours as needed for moderate pain.  0   levothyroxine (SYNTHROID) 75 MCG tablet TAKE 1 TABLET (75 MCG TOTAL) BY MOUTH DAILY WITH BREAKFAST. 90 tablet 0   loratadine (CLARITIN) 10 MG tablet Take 10 mg by mouth daily.     meclizine (ANTIVERT) 25 MG tablet Take 1 tablet (25 mg total) by mouth 3 (three) times daily as needed for dizziness. 30 tablet 0   PEG-KCl-NaCl-NaSulf-Na Asc-C (PLENVU) 140 g SOLR Take 140 g by mouth as directed. Manufacturer's coupon Universal coupon code:BIN: P2366821; GROUP: [id number]; PCN: [id number]; ID: [id number]; PAY NO MORE $50 1 each 0   tretinoin (RETIN-A) 0.05 % cream Apply 1 application topically at bedtime.     budesonide-formoterol (SYMBICORT) 80-4.5 MCG/ACT inhaler Inhale 2 puffs into the lungs in the morning and at bedtime. (Patient not taking: Reported on 08/27/2021) 1  each 12   No current facility-administered medications for this visit.      ___________________________________________________________________ Objective   Exam:  BP 126/66    Pulse 70    Ht _0  (1.626 m)    Wt 192 lb (87.1 kg)    BMI 32.96 kg/m  Wt Readings from Last 3 Encounters:  08/27/21 192 lb (87.1 kg)  08/19/21 193 lb (87.5 kg)  08/17/21 185 lb (83.9 kg)    General: Well-appearing Eyes: sclera anicteric, no redness ENT: oral mucosa moist without lesions, no cervical or supraclavicular lymphadenopathy CV: RRR without murmur, S1/S2, no JVD, no peripheral edema Resp: clear to auscultation bilaterally, normal RR and effort noted.  Breathing comfortably on room air GI: soft, right inguinal tenderness, with active bowel sounds. No guarding or palpable organomegaly noted.  No palpable hernia or other abnormality Skin; warm and dry, no rash or jaundice noted Neuro: awake, alert and oriented x 3. Normal gross motor function and fluent speech  Labs:   Assessment: Encounter Diagnoses  Name Primary?   Personal history of colonic polyps Yes   Rectal bleeding    Family history of colon cancer     Self-limited painless rectal bleeding, likely hemorrhoidal in nature. She has had intermittent abdominal pain for years, and experienced some of it recently.  9 adenomatous colon polyps last exam, due for surveillance colonoscopy. Her pulmonary condition has improved and I believe she can safely undergo sedation for this procedure.  She was agreeable after discussion of procedure and risks.  The benefits and risks of the planned procedure were described in detail with the patient or (when appropriate) their health care proxy.  Risks were outlined as including, but not limited to, bleeding, infection, perforation, adverse medication reaction leading to cardiac or pulmonary decompensation, pancreatitis (if ERCP).  The limitation of incomplete mucosal visualization was also discussed.   No guarantees or warranties were given.   Thank you for the courtesy of this consult.  Please call me with any questions or concerns.  Nelida Meuse III  CC: Referring provider noted above

## 2021-08-29 ENCOUNTER — Encounter: Payer: Self-pay | Admitting: Infectious Disease

## 2021-08-29 DIAGNOSIS — Z7185 Encounter for immunization safety counseling: Secondary | ICD-10-CM

## 2021-08-29 DIAGNOSIS — R911 Solitary pulmonary nodule: Secondary | ICD-10-CM | POA: Insufficient documentation

## 2021-08-29 HISTORY — DX: Encounter for immunization safety counseling: Z71.85

## 2021-08-29 HISTORY — DX: Solitary pulmonary nodule: R91.1

## 2021-08-29 NOTE — Progress Notes (Signed)
Subjective:  Chief complaint: Follow-up for work-up for lung nodules and bronchiectatic changes on CT scan   Patient ID: Karen Jones, female    DOB: 1951/02/07, 71 y.o.   MRN: [id number]  HPI  Karen Jones is a 71 year old Caucasian lady who was referred to my partner Dr. Review originally due to abnormal CT scan that was current concerning for potential atypical infection with an organism such as potentially Mycobacterium avium.  She had been in her usual health until she had been visiting in [place] and [place] and was on a bus tour with 17 people.  She tells me that nearly everyone on the trip came down with what appeared to be as upper respiratory sinus tract infection, despite them taking physical distance someone other wearing masks and having seats wipe down hand hygiene etc.    She was given 2 courses of treatment with azithromycin and prednisone then doxycycline.  Cough and congestion got better during course with doxycycline but she continued have pleurisy and ultimately sick chest x-ray and then CT scan was obtained that showed heterogeneous consolidation and centrilobular nodularity in the dependent bilateral lung bases and posterior lingula with some associated scarring there is some diffuse bronchial wall thickening which suggested potential mycobacterial infection versus aspiration per radiology's read.  Work-up in our clinic included get if cryptococcal antigen and serum, with negative Blastomyces antigen Aspergillus antigen.  She was also sent out with toolkit to obtain sputum's but she was not producing sputum so this could not be sent for culture.  Ultimately she underwent bronchoscopy on August 17, 2021 with Dr. Bjorn Loser.  Apparently mucosa appeared highly friable.  Biopsy was obtained which was negative for malignancy cultures from bronchoalveolar lavage did not feel reveal any bacterial organisms on routine cultures fungal cultures were negative AFB stain was negative  and culture still incubating.  She is without any cough whatsoever at present.  She is without weight loss without fevers malaise or other systemic symptoms to suggest infection.  She does have a remote history of travel to the [place] including [place].    Past Medical History:  Diagnosis Date   Allergy    Anemia    Benign positional vertigo    Cancer (HCC)    skin   Colon polyps    Hypothyroidism    OAB (overactive bladder)    Pain in limb    Pneumonia    Thyroid disease     Past Surgical History:  Procedure Laterality Date   [place]   BRONCHIAL WASHINGS  08/17/2021   Procedure: BRONCHIAL WASHINGS;  Surgeon: Maryjane Hurter, MD;  Location: [facility];  Service: Pulmonary;;   COLONOSCOPY  1995,1997,2001,2005,2010   polyps removed every colonoscopy except 1 time,per pt   HAND SURGERY     to remove webbing between fingers    HEMOSTASIS CONTROL  08/17/2021   Procedure: HEMOSTASIS CONTROL;  Surgeon: Maryjane Hurter, MD;  Location[facility] ENDOSCOPY;  Service: Pulmonary;;   POLYPECTOMY     TONSILLECTOMY     TUBAL LIGATION     UMBILICAL HERNIA REPAIR     VIDEO BRONCHOSCOPY N/A 08/17/2021   Procedure: VIDEO BRONCHOSCOPY WITHOUT FLUORO;  Surgeon: Maryjane Hurter, MD;  Location: [facility] ENDOSCOPY;  Service: Pulmonary;  Laterality: N/A;    Family History  Problem Relation Age of Onset   Emphysema Mother    Colon cancer Father 79   Cancer Father  Heart disease Father    Heart attack Father    Colon polyps Brother    Breast cancer Neg Hx    Esophageal cancer Neg Hx    Rectal cancer Neg Hx    Stomach cancer Neg Hx       Social History   Socioeconomic History   Marital status: Married    Spouse name: Not on file   Number of children: Not on file   Years of education: Not on file   Highest education level: Not on file  Occupational History   Occupation: Retired  Tobacco Use   Smoking status: Never    Smokeless tobacco: Never  Vaping Use   Vaping Use: Never used  Substance and Sexual Activity   Alcohol use: Yes    Alcohol/week: 5.0 standard drinks    Types: 3 Glasses of wine, 2 Shots of liquor per week   Drug use: No   Sexual activity: Not Currently  Other Topics Concern   Not on file  Social History Narrative   Not on file   Social Determinants of Health   Financial Resource Strain: Low Risk    Difficulty of Paying Living Expenses: Not hard at all  Food Insecurity: No Food Insecurity   Worried About Charity fundraiser in the Last Year: Never true   Ran Out of Food in the Last Year: Never true  Transportation Needs: No Transportation Needs   Lack of Transportation (Medical): No   Lack of Transportation (Non-Medical): No  Physical Activity: Insufficiently Active   Days of Exercise per Week: 3 days   Minutes of Exercise per Session: 40 min  Stress: No Stress Concern Present   Feeling of Stress : Not at all  Social Connections: Socially Integrated   Frequency of Communication with Friends and Family: Twice a week   Frequency of Social Gatherings with Friends and Family: Twice a week   Attends Religious Services: More than 4 times per year   Active Member of [employer] or Organizations: Yes   Attends Music therapist: More than 4 times per year   Marital Status: Married    Allergies  Allergen Reactions   Penicillins Shortness Of Breath and Rash    rash, difficulty breathing   Levofloxacin     Myalgias (intolerance)     Current Outpatient Medications:    budesonide-formoterol (SYMBICORT) 80-4.5 MCG/ACT inhaler, Inhale 2 puffs into the lungs in the morning and at bedtime. (Patient not taking: Reported on 08/27/2021), Disp: 1 each, Rfl: 12   cholecalciferol (VITAMIN D3) 25 MCG (1000 UNIT) tablet, Take 1,000 Units by mouth daily., Disp: , Rfl:    furosemide (LASIX) 20 MG tablet, Take 1 tablet (20 mg total) by mouth daily as needed., Disp: 30 tablet, Rfl: 3    ibuprofen (ADVIL) 200 MG tablet, Take 200-400 mg by mouth every 8 (eight) hours as needed for moderate pain., Disp: , Rfl: 0   levothyroxine (SYNTHROID) 75 MCG tablet, TAKE 1 TABLET (75 MCG TOTAL) BY MOUTH DAILY WITH BREAKFAST., Disp: 90 tablet, Rfl: 0   loratadine (CLARITIN) 10 MG tablet, Take 10 mg by mouth daily., Disp: , Rfl:    meclizine (ANTIVERT) 25 MG tablet, Take 1 tablet (25 mg total) by mouth 3 (three) times daily as needed for dizziness., Disp: 30 tablet, Rfl: 0   PEG-KCl-NaCl-NaSulf-Na Asc-C (PLENVU) 140 g SOLR, Take 140 g by mouth as directed. Manufacturer's coupon Universal coupon code:BIN: P2366821; GROUP: [id number]; PCN: [id number]; ID: [id number]; PAY NO MORE $  50, Disp: 1 each, Rfl: 0   tretinoin (RETIN-A) 0.05 % cream, Apply 1 application topically at bedtime., Disp: , Rfl:    Review of Systems  Constitutional:  Negative for activity change, appetite change, chills, diaphoresis, fatigue, fever and unexpected weight change.  HENT:  Negative for congestion, rhinorrhea, sinus pressure, sneezing, sore throat and trouble swallowing.   Eyes:  Negative for photophobia and visual disturbance.  Respiratory:  Negative for cough, chest tightness, shortness of breath, wheezing and stridor.   Cardiovascular:  Negative for chest pain, palpitations and leg swelling.  Gastrointestinal:  Negative for abdominal distention, abdominal pain, anal bleeding, blood in stool, constipation, diarrhea, nausea and vomiting.  Genitourinary:  Negative for difficulty urinating, dysuria, flank pain and hematuria.  Musculoskeletal:  Negative for arthralgias, back pain, gait problem, joint swelling and myalgias.  Skin:  Negative for color change, pallor, rash and wound.  Neurological:  Negative for dizziness, tremors, weakness and light-headedness.  Hematological:  Negative for adenopathy. Does not bruise/bleed easily.  Psychiatric/Behavioral:  Negative for agitation, behavioral problems, confusion, decreased  concentration, dysphoric mood and sleep disturbance.       Objective:   Physical Exam Constitutional:      General: She is not in acute distress.    Appearance: Normal appearance. She is well-developed. She is not ill-appearing or diaphoretic.  HENT:     Head: Normocephalic and atraumatic.     Right Ear: Hearing and external ear normal.     Left Ear: Hearing and external ear normal.     Nose: No nasal deformity or rhinorrhea.  Eyes:     General: No scleral icterus.    Conjunctiva/sclera: Conjunctivae normal.     Right eye: Right conjunctiva is not injected.     Left eye: Left conjunctiva is not injected.     Pupils: Pupils are equal, round, and reactive to light.  Neck:     Vascular: No JVD.  Cardiovascular:     Rate and Rhythm: Normal rate and regular rhythm.     Heart sounds: Normal heart sounds, S1 normal and S2 normal. No murmur heard. Pulmonary:     Effort: Pulmonary effort is normal. No respiratory distress.     Breath sounds: Normal breath sounds. No stridor. No wheezing, rhonchi or rales.  Chest:     Chest wall: No tenderness.  Abdominal:     General: Bowel sounds are normal. There is no distension.     Palpations: Abdomen is soft.     Tenderness: There is no abdominal tenderness.  Musculoskeletal:        General: Normal range of motion.     Right shoulder: Normal.     Left shoulder: Normal.     Cervical back: Normal range of motion and neck supple.     Right hip: Normal.     Left hip: Normal.     Right knee: Normal.     Left knee: Normal.  Lymphadenopathy:     Head:     Right side of head: No submandibular, preauricular or posterior auricular adenopathy.     Left side of head: No submandibular, preauricular or posterior auricular adenopathy.     Cervical: No cervical adenopathy.     Right cervical: No superficial or deep cervical adenopathy.    Left cervical: No superficial or deep cervical adenopathy.  Skin:    General: Skin is warm and dry.      Coloration: Skin is not pale.     Findings: No abrasion, bruising, ecchymosis,  erythema, lesion or rash.     Nails: There is no clubbing.  Neurological:     General: No focal deficit present.     Mental Status: She is alert and oriented to person, place, and time.     Sensory: No sensory deficit.     Coordination: Coordination normal.     Gait: Gait normal.  Psychiatric:        Attention and Perception: She is attentive.        Mood and Affect: Mood normal.        Speech: Speech normal.        Behavior: Behavior normal. Behavior is cooperative.        Thought Content: Thought content normal.        Judgment: Judgment normal.          Assessment & Plan:   Bronchiectatic changes, lung nodules:  Present her clinical course that she had this October seems more consistent with a viral infection followed by bacterial superinfection.  Certainly is possible that she could have the beginnings of a mycobacterial infection though if she does she is doing quite well right now and by no means would need criteria for diagnosis and certainly not treatment at this point in time.  I will send serum for Coccidioides antibodies as well as angiotensin-converting enzyme and an ANCA.  Histoplasma Anja antigen and Blastomyces antigens are more sensitive 1 done in urine so I will send these off though I will be very surprised that they would be positive either.  Vaccine counseling recommended Prevnar 20 which she is receiving today.  She is up-to-date on her COVID-19 vaccination including updated bivalent booster   I spent 42  minutes with the patient including than 50% of the time in face to face counseling of the patient the differential diagnosis for bronchiectatic changes of the lungs lung nodules personally reviewing her CT from June 14, 2021 as well as chest x-ray June 09, 2021 updated cultures serologies reviewing along with review of medical records in preparation for the visit and  during the visit and in coordination of her care.

## 2021-08-30 ENCOUNTER — Encounter: Payer: Self-pay | Admitting: Infectious Disease

## 2021-08-30 ENCOUNTER — Other Ambulatory Visit: Payer: Self-pay

## 2021-08-30 ENCOUNTER — Ambulatory Visit (INDEPENDENT_AMBULATORY_CARE_PROVIDER_SITE_OTHER): Payer: Medicare Other | Admitting: Infectious Disease

## 2021-08-30 VITALS — BP 125/77 | HR 82 | Temp 97.4°F | Resp 16 | Ht 64.0 in | Wt 194.0 lb

## 2021-08-30 DIAGNOSIS — J479 Bronchiectasis, uncomplicated: Secondary | ICD-10-CM | POA: Diagnosis not present

## 2021-08-30 DIAGNOSIS — R911 Solitary pulmonary nodule: Secondary | ICD-10-CM | POA: Diagnosis not present

## 2021-08-30 DIAGNOSIS — Z7185 Encounter for immunization safety counseling: Secondary | ICD-10-CM

## 2021-08-30 DIAGNOSIS — Z23 Encounter for immunization: Secondary | ICD-10-CM | POA: Diagnosis not present

## 2021-09-03 LAB — PAN-ANCA
ANCA Screen: NEGATIVE
Myeloperoxidase Abs: <1 AI
Serine Protease 3: <1 AI

## 2021-09-03 LAB — MVISTA BLASTOMYCES QNT AG, URINE
Interpretation: NEGATIVE
Result (ng/ml): NOT DETECTED ng/mL

## 2021-09-03 LAB — ANGIOTENSIN CONVERTING ENZYME: Angiotensin-Converting Enzyme: 26 U/L (ref 9–67)

## 2021-09-03 LAB — SEDIMENTATION RATE: Sed Rate: 6 mm/h (ref 0–30)

## 2021-09-03 LAB — COCCIDIOIDES ANTIBODIES: COCCIDIOIDES AB, CF, SERUM: <1:2 {titer}

## 2021-09-03 LAB — HISTOPLASMA ANTIGEN, URINE: Histoplasma Antigen, urine: <0.2 ng/mL

## 2021-09-03 LAB — C-REACTIVE PROTEIN: CRP: 2.9 mg/L (ref <8.0)

## 2021-09-05 ENCOUNTER — Encounter: Payer: Self-pay | Admitting: Student

## 2021-09-08 ENCOUNTER — Ambulatory Visit (INDEPENDENT_AMBULATORY_CARE_PROVIDER_SITE_OTHER): Payer: Medicare Other | Admitting: Student

## 2021-09-08 ENCOUNTER — Other Ambulatory Visit: Payer: Self-pay

## 2021-09-08 ENCOUNTER — Encounter: Payer: Self-pay | Admitting: Student

## 2021-09-08 VITALS — BP 120/68 | HR 66 | Temp 98.0°F | Ht 64.0 in | Wt 192.8 lb

## 2021-09-08 DIAGNOSIS — J479 Bronchiectasis, uncomplicated: Secondary | ICD-10-CM | POA: Diagnosis not present

## 2021-09-08 NOTE — Progress Notes (Signed)
Synopsis: Referred for bronchiectasis by Midge Minium, MD  Subjective:   PATIENT ID: Karen Jones: female DOB: October 06, 1950, MRN: [id number]  Chief Complaint  Patient presents with   Follow-up    Pt is here today to discuss results of recent tests. States she has been doing okay since last visit.   8yF with history of allergy, R t-tube placement 2017 who is referred for bronchiectasis.  She says she went to [place] 04/11/21 on a bus trip and several folks developed sinus issues/cough. She says no one had covid but no one, including her, was tested for covid. She didn't start feeling bad until 10/2. Got to [facility] and seemed to really worsen after [place] dinner/show, remembers it being quite cold that night. Tried various symptomatic measures without improvement. A great deal of chest congestion, sinus congestion, very productive cough. Maybe low grade fever, no night sweats, unintentional weight loss.   Husband after they got back to [place] had similar symptoms, was prescribed doxy with improvement in his symptoms.   She was eventually seen by physician with telehealth visit through [facility] 10/14 who prescribed prednisone, z pack, tessalon perles, sudafed, albuterol. Re-upped prednisone at a visit a week later with FNP. Eventually she was also prescribed doxycycline for 10d course which dramatically improved symptoms. Subsequently developed pleurtic CP so CT Chest obtained 11/21 showing bronchiectasis, mucus plugging, atelectasis. Pleuritic CP persists. She has no DOE.   She has occasional issues with coughing while she's eating/drinking.   Mother died of emphysema  She has had some secondhand smoke exposure growing up.  Interval HPI:  Was able to walk about 2.5 miles on the beach at [facility] and didn't have much in way of [place]. Has been on symbicort 80 2 puffs twice daily, rinsing mouth after use. Her pleuritic CP had actually self-resolved prior to starting symbicort.    Otherwise pertinent review of systems is negative.      Past Medical History:  Diagnosis Date   Allergy    Anemia    Benign positional vertigo    Cancer (HCC)    skin   Colon polyps    Hypothyroidism    Lung nodule 08/29/2021   OAB (overactive bladder)    Pain in limb    Pneumonia    Thyroid disease    Vaccine counseling 08/29/2021     Family History  Problem Relation Age of Onset   Emphysema Mother    Colon cancer Father 60   Cancer Father    Heart disease Father    Heart attack Father    Colon polyps Brother    Breast cancer Neg Hx    Esophageal cancer Neg Hx    Rectal cancer Neg Hx    Stomach cancer Neg Hx      Past Surgical History:  Procedure Laterality Date   ABDOMINAL HYSTERECTOMY     BILATERAL OOPHORECTOMY  1995   BRONCHIAL WASHINGS  08/17/2021   Procedure: BRONCHIAL WASHINGS;  Surgeon: Maryjane Hurter, MD;  Location: [facility] ENDOSCOPY;  Service: Pulmonary;;   COLONOSCOPY  1995,1997,2001,2005,2010   polyps removed every colonoscopy except 1 time,per pt   HAND SURGERY     to remove webbing between fingers    HEMOSTASIS CONTROL  08/17/2021   Procedure: HEMOSTASIS CONTROL;  Surgeon: Maryjane Hurter, MD;  Location: [facility] ENDOSCOPY;  Service: Pulmonary;;   POLYPECTOMY     TONSILLECTOMY     TUBAL LIGATION     UMBILICAL HERNIA REPAIR  VIDEO BRONCHOSCOPY N/A 08/17/2021   Procedure: VIDEO BRONCHOSCOPY WITHOUT FLUORO;  Surgeon: Maryjane Hurter, MD;  Location: [facility] ENDOSCOPY;  Service: Pulmonary;  Laterality: N/A;    Social History   Socioeconomic History   Marital status: Married    Spouse name: Not on file   Number of children: Not on file   Years of education: Not on file   Highest education level: Not on file  Occupational History   Occupation: Retired  Tobacco Use   Smoking status: Never   Smokeless tobacco: Never  Vaping Use   Vaping Use: Never used  Substance and Sexual Activity   Alcohol use: Yes    Alcohol/week: 5.0 standard drinks     Types: 3 Glasses of wine, 2 Shots of liquor per week   Drug use: No   Sexual activity: Not Currently  Other Topics Concern   Not on file  Social History Narrative   Not on file   Social Determinants of Health   Financial Resource Strain: Low Risk    Difficulty of Paying Living Expenses: Not hard at all  Food Insecurity: No Food Insecurity   Worried About Charity fundraiser in the Last Year: Never true   Ran Out of Food in the Last Year: Never true  Transportation Needs: No Transportation Needs   Lack of Transportation (Medical): No   Lack of Transportation (Non-Medical): No  Physical Activity: Insufficiently Active   Days of Exercise per Week: 3 days   Minutes of Exercise per Jones: 40 min  Stress: No Stress Concern Present   Feeling of Stress : Not at all  Social Connections: Socially Integrated   Frequency of Communication with Friends and Family: Twice a week   Frequency of Social Gatherings with Friends and Family: Twice a week   Attends Religious Services: More than 4 times per year   Active Member of [employer] or Organizations: Yes   Attends Music therapist: More than 4 times per year   Marital Status: Married  Human resources officer Violence: Not At Risk   Fear of Current or Ex-Partner: No   Emotionally Abused: No   Physically Abused: No   Sexually Abused: No     Allergies  Allergen Reactions   Penicillins Shortness Of Breath and Rash    rash, difficulty breathing   Levofloxacin     Myalgias (intolerance)     Outpatient Medications Prior to Visit  Medication Sig Dispense Refill   budesonide-formoterol (SYMBICORT) 80-4.5 MCG/ACT inhaler Inhale 2 puffs into the lungs in the morning and at bedtime. 1 each 12   cholecalciferol (VITAMIN D3) 25 MCG (1000 UNIT) tablet Take 1,000 Units by mouth daily.     furosemide (LASIX) 20 MG tablet Take 1 tablet (20 mg total) by mouth daily as needed. 30 tablet 3   ibuprofen (ADVIL) 200 MG tablet Take 200-400 mg by mouth  every 8 (eight) hours as needed for moderate pain.  0   levothyroxine (SYNTHROID) 75 MCG tablet TAKE 1 TABLET (75 MCG TOTAL) BY MOUTH DAILY WITH BREAKFAST. 90 tablet 0   loratadine (CLARITIN) 10 MG tablet Take 10 mg by mouth daily.     meclizine (ANTIVERT) 25 MG tablet Take 1 tablet (25 mg total) by mouth 3 (three) times daily as needed for dizziness. 30 tablet 0   PEG-KCl-NaCl-NaSulf-Na Asc-C (PLENVU) 140 g SOLR Take 140 g by mouth as directed. Manufacturer's coupon Universal coupon code:BIN: P2366821; GROUP: [id number]; PCN: [id number]; ID: [id number]; PAY NO MORE $50 1  each 0   tretinoin (RETIN-A) 0.05 % cream Apply 1 application topically at bedtime.     No facility-administered medications prior to visit.       Objective:   Physical Exam:  General appearance: 71 y.o., female, NAD, conversant  Eyes: anicteric sclerae; PERRL, tracking appropriately HENT: NCAT; MMM Neck: Trachea midline; no lymphadenopathy, no JVD Lungs: CTAB, no crackles, no wheeze, with normal respiratory effort CV: RRR, no murmur  Abdomen: Soft, non-tender; non-distended, BS present  Extremities: No peripheral edema, warm Skin: Normal turgor and texture; no rash Psych: Appropriate affect Neuro: Alert and oriented to person and place, no focal deficit     Vitals:   09/08/21 1415  BP: 120/68  Pulse: 66  Temp: 98 F (36.7 C)  TempSrc: Oral  SpO2: 95%  Weight: 192 lb 12.8 oz (87.5 kg)  Height: _0  (1.626 m)     95% on RA BMI Readings from Last 3 Encounters:  09/08/21 33.09 kg/m  08/30/21 33.30 kg/m  08/27/21 32.96 kg/m   Wt Readings from Last 3 Encounters:  09/08/21 192 lb 12.8 oz (87.5 kg)  08/30/21 194 lb (88 kg)  08/27/21 192 lb (87.1 kg)     CBC    Component Value Date/Time   WBC 4.5 08/17/2021 0618   RBC 4.20 08/17/2021 0618   HGB 12.6 08/17/2021 0618   HCT 40.1 08/17/2021 0618   PLT 222 08/17/2021 0618   MCV 95.5 08/17/2021 0618   MCH 30.0 08/17/2021 0618   MCHC 31.4 08/17/2021  0618   RDW 13.6 08/17/2021 0618   LYMPHSABS 1.2 06/09/2021 1041   MONOABS 0.4 06/09/2021 1041   EOSABS 0.1 06/09/2021 1041   BASOSABS 0.1 06/09/2021 1041    Low level eosinophilia A1AT phenotype PI*MM, normal level IgE 3 Ig levels WNL RF negative Endemic fungal serologies negative  BAL AFB, fungal cx pending  Chest Imaging: HRCT Chest 06/14/21 reviewed by me remarkable for lower lobe/RML/lingula predominant bronchiectasis with mucus plugging and atelectasis,scar  Pulmonary Functions Testing Results: PFT Results Latest Ref Rng & Units 08/09/2021  FVC-Pre L 2.43  FVC-Predicted Pre % 82  FVC-Post L 2.43  FVC-Predicted Post % 83  Pre FEV1/FVC % % 63  Post FEV1/FCV % % 71  FEV1-Pre L 1.52  FEV1-Predicted Pre % 69  FEV1-Post L 1.72  DLCO uncorrected ml/min/mmHg 15.02  DLCO UNC% % 77  DLCO corrected ml/min/mmHg 13.99  DLCO COR %Predicted % 72  DLVA Predicted % 84  TLC L 4.73  TLC % Predicted % 93  RV % Predicted % 105   Mild obstruction with BD response, mildly reduced diffusing capacity     Assessment & Plan:   # Bronchiectasis: # Recent LRTI:  In agreement with ID that course seems most consistent with viral LRTI superimposed on background of a more chronic process. Etiology of her bronchiectasis is unclear - whether caused by or predisposing to NTM or other atypical infection, connective tissue disease but screening tests negative, aspiration given lower lobe predominance (at least recurrent macroaspiration seems unlikely by history), immune deficiency but testing for CVID neg.   # Obstructive lung disease Unclear etiology unless caused by her bronchiectasis (burden of which however is mild), alternatively consider longstanding asthma but history really not suggestive of this. Regardless she doesn't sound impressively symptomatic and hasn't had much response to trial of LABA/ICS so far.    Plan: - trial of ICS/LABA for 8 weeks, can dc if not noticing much effect on  any activity limitation  due to DOE (doesn't have much limitation to begin with anyway though) - will follow results of BAL  I spent 45 minutes dedicated to the care of this patient on the date of this encounter to include pre-visit review of records, face-to-face time with the patient discussing conditions above, post visit ordering of testing, clinical documentation with the electronic health record, and communicating necessary findings to members of the patients care team.       Maryjane Hurter, MD [facility] 09/08/2021 2:37 PM

## 2021-09-08 NOTE — Patient Instructions (Addendum)
-   Send me a message when the AFB and fungus cultures are finalized, happy to discuss over the phone. - at least 8 week trial of symbicort, if not helpful at that point then can discontinue - See you in 6 months!

## 2021-09-14 ENCOUNTER — Other Ambulatory Visit: Payer: Self-pay | Admitting: Family Medicine

## 2021-09-14 DIAGNOSIS — E039 Hypothyroidism, unspecified: Secondary | ICD-10-CM

## 2021-09-14 LAB — FUNGAL ORGANISM REFLEX

## 2021-09-14 LAB — FUNGUS CULTURE RESULT

## 2021-09-14 LAB — FUNGUS CULTURE WITH STAIN

## 2021-09-27 ENCOUNTER — Telehealth: Payer: Self-pay | Admitting: Student

## 2021-09-27 NOTE — Telephone Encounter (Signed)
Spoke to patient.  ?She stated that she received flutter device 06/23/2021. She received a bill for it this past weekend.  ?She contacted [employer] and was told that insurance was not billed.  ? ? ?Lm for Melissa with [employer].  ?

## 2021-09-27 NOTE — Telephone Encounter (Signed)
Has question about fluttervalve from 11/30. States she received a bill then. She has already spoken to [facility], and they said everything was settled. Called number from whomever mailed her the bill and they said that they didnt have insurance listed for her. Please advise. ?

## 2021-09-29 DIAGNOSIS — D2261 Melanocytic nevi of right upper limb, including shoulder: Secondary | ICD-10-CM | POA: Diagnosis not present

## 2021-09-29 DIAGNOSIS — B078 Other viral warts: Secondary | ICD-10-CM | POA: Diagnosis not present

## 2021-09-29 DIAGNOSIS — Z1283 Encounter for screening for malignant neoplasm of skin: Secondary | ICD-10-CM | POA: Diagnosis not present

## 2021-09-29 DIAGNOSIS — L570 Actinic keratosis: Secondary | ICD-10-CM | POA: Diagnosis not present

## 2021-09-29 DIAGNOSIS — L820 Inflamed seborrheic keratosis: Secondary | ICD-10-CM | POA: Diagnosis not present

## 2021-09-29 DIAGNOSIS — X32XXXD Exposure to sunlight, subsequent encounter: Secondary | ICD-10-CM | POA: Diagnosis not present

## 2021-09-29 DIAGNOSIS — D225 Melanocytic nevi of trunk: Secondary | ICD-10-CM | POA: Diagnosis not present

## 2021-09-29 DIAGNOSIS — L218 Other seborrheic dermatitis: Secondary | ICD-10-CM | POA: Diagnosis not present

## 2021-09-30 ENCOUNTER — Encounter: Payer: Self-pay | Admitting: Student

## 2021-10-04 ENCOUNTER — Other Ambulatory Visit: Payer: Self-pay

## 2021-10-04 ENCOUNTER — Encounter: Payer: Self-pay | Admitting: Student

## 2021-10-04 NOTE — Telephone Encounter (Signed)
Dr. Verlee Monte, ?Please see patient comment and advise.  Thank you. ?

## 2021-10-05 ENCOUNTER — Encounter: Payer: Self-pay | Admitting: Student

## 2021-10-05 LAB — ACID FAST CULTURE WITH REFLEXED SENSITIVITIES (MYCOBACTERIA): Acid Fast Culture: NEGATIVE

## 2021-10-12 ENCOUNTER — Encounter: Payer: Self-pay | Admitting: Gastroenterology

## 2021-10-12 ENCOUNTER — Other Ambulatory Visit: Payer: Self-pay | Admitting: Gastroenterology

## 2021-10-12 ENCOUNTER — Ambulatory Visit (AMBULATORY_SURGERY_CENTER): Payer: Medicare Other | Admitting: Gastroenterology

## 2021-10-12 VITALS — BP 110/71 | HR 68 | Temp 97.3°F | Resp 16 | Ht 64.0 in | Wt 192.0 lb

## 2021-10-12 DIAGNOSIS — Z86010 Personal history of colonic polyps: Secondary | ICD-10-CM | POA: Diagnosis not present

## 2021-10-12 DIAGNOSIS — D123 Benign neoplasm of transverse colon: Secondary | ICD-10-CM

## 2021-10-12 DIAGNOSIS — Z800 Family history of malignant neoplasm of digestive organs: Secondary | ICD-10-CM | POA: Diagnosis not present

## 2021-10-12 DIAGNOSIS — D124 Benign neoplasm of descending colon: Secondary | ICD-10-CM | POA: Diagnosis not present

## 2021-10-12 DIAGNOSIS — K625 Hemorrhage of anus and rectum: Secondary | ICD-10-CM

## 2021-10-12 DIAGNOSIS — K635 Polyp of colon: Secondary | ICD-10-CM | POA: Diagnosis not present

## 2021-10-12 MED ORDER — SODIUM CHLORIDE 0.9 % IV SOLN: 500.0000 mL | Freq: Once | INTRAVENOUS | Status: DC

## 2021-10-12 NOTE — Progress Notes (Signed)
History and Physical: ? This patient presents for endoscopic testing for: ?Encounter Diagnoses  ?Name Primary?  ? Personal history of colonic polyps Yes  ? Family history of colon cancer in father   ? ? ?Clinical details in 08/27/21 [facility] office note. ?No changes since then. ? ?ROS: ?Patient denies chest pain or shortness of breath ? ? ?Past Medical History: ?Past Medical History:  ?Diagnosis Date  ? Allergy   ? Anemia   ? Benign positional vertigo   ? Cancer [facility])   ? skin  ? Colon polyps   ? Hypothyroidism   ? Lung nodule 08/29/2021  ? OAB (overactive bladder)   ? Pain in limb   ? Pneumonia   ? Thyroid disease   ? Vaccine counseling 08/29/2021  ? ? ? ?Past Surgical History: ?Past Surgical History:  ?Procedure Laterality Date  ? ABDOMINAL HYSTERECTOMY    ? BILATERAL OOPHORECTOMY  1995  ? BRONCHIAL WASHINGS  08/17/2021  ? Procedure: BRONCHIAL WASHINGS;  Surgeon: Maryjane Hurter, MD;  Location: [facility] ENDOSCOPY;  Service: Pulmonary;;  ? COLONOSCOPY  1995,1997,2001,2005,2010  ? polyps removed every colonoscopy except 1 time,per pt  ? HAND SURGERY    ? to remove webbing between fingers   ? HEMOSTASIS CONTROL  08/17/2021  ? Procedure: HEMOSTASIS CONTROL;  Surgeon: Maryjane Hurter, MD;  Location: [facility] ENDOSCOPY;  Service: Pulmonary;;  ? POLYPECTOMY    ? TONSILLECTOMY    ? TUBAL LIGATION    ? UMBILICAL HERNIA REPAIR    ? VIDEO BRONCHOSCOPY N/A 08/17/2021  ? Procedure: VIDEO BRONCHOSCOPY WITHOUT FLUORO;  Surgeon: Maryjane Hurter, MD;  Location: [facility] ENDOSCOPY;  Service: Pulmonary;  Laterality: N/A;  ? ? ?Allergies: ?Allergies  ?Allergen Reactions  ? Penicillins Shortness Of Breath and Rash  ?  rash, difficulty breathing  ? Levofloxacin   ?  Myalgias (intolerance)  ? ? ?Outpatient Meds: ?Current Outpatient Medications  ?Medication Sig Dispense Refill  ? budesonide-formoterol (SYMBICORT) 80-4.5 MCG/ACT inhaler Inhale 2 puffs into the lungs in the morning and at bedtime. 1 each 12  ? cholecalciferol (VITAMIN D3) 25 MCG (1000 UNIT)  tablet Take 1,000 Units by mouth daily.    ? levothyroxine (SYNTHROID) 75 MCG tablet TAKE 1 TABLET (75 MCG TOTAL) BY MOUTH DAILY WITH BREAKFAST. 90 tablet 0  ? loratadine (CLARITIN) 10 MG tablet Take 10 mg by mouth daily.    ? tretinoin (RETIN-A) 0.05 % cream Apply 1 application topically at bedtime.    ? clobetasol (TEMOVATE) 0.05 % external solution Apply topically. (Patient not taking: Reported on 10/12/2021)    ? furosemide (LASIX) 20 MG tablet Take 1 tablet (20 mg total) by mouth daily as needed. 30 tablet 3  ? ibuprofen (ADVIL) 200 MG tablet Take 200-400 mg by mouth every 8 (eight) hours as needed for moderate pain.  0  ? meclizine (ANTIVERT) 25 MG tablet Take 1 tablet (25 mg total) by mouth 3 (three) times daily as needed for dizziness. 30 tablet 0  ? ?Current Facility-Administered Medications  ?Medication Dose Route Frequency Provider Last Rate Last Admin  ? 0.9 %  sodium chloride infusion  500 mL Intravenous Once Doran Stabler, MD      ? ? ? ? ?___________________________________________________________________ ?Objective  ? ?Exam: ? ?BP (!) 123/52   Pulse 67   Temp (!) 97.3 ?F (36.3 ?C)   Ht '5\' 4"'$  (1.626 m)   Wt 192 lb (87.1 kg)   SpO2 94%   BMI 32.96 kg/m?  ? ?CV: RRR  without murmur, S1/S2 ?Resp: clear to auscultation bilaterally, normal RR and effort noted ?GI: soft, no tenderness, with active bowel sounds. ? ? ?Assessment: ?Encounter Diagnoses  ?Name Primary?  ? Personal history of colonic polyps Yes  ? Family history of colon cancer in father   ? ? ? ?Plan: ?Colonoscopy ? The benefits and risks of the planned procedure were described in detail with the patient or (when appropriate) their health care proxy.  Risks were outlined as including, but not limited to, bleeding, infection, perforation, adverse medication reaction leading to cardiac or pulmonary decompensation, pancreatitis (if ERCP).  The limitation of incomplete mucosal visualization was also discussed.  No guarantees or warranties  were given. ? ? ? ?The patient is appropriate for an endoscopic procedure in the ambulatory setting. ? ? - Wilfrid Lund, MD ? ? ? ? ?

## 2021-10-12 NOTE — Op Note (Signed)
[facility] ?Patient Name: Karen Jones ?Procedure Date: 10/12/2021 11:52 AM ?MRN: [id number] ?Endoscopist: Estill Cotta. Loletha Carrow , MD ?Age: 71 ?Referring MD:  ?Date of Birth: 11-01-1950 ?Gender: Female ?Account #: [id number] ?Procedure:                Colonoscopy ?Indications:              Screening in patient at increased risk: Colorectal  ?                          cancer in father 76 or older, High risk colon  ?                          cancer surveillance: Personal history of multiple  ?                          (3 or more) adenomas ?                          TA x 9 last colonoscopy Dec 2020 ?                          6 polyps 2015( some adenomas), including one 72m  ?                          in right colon ?                          incidental episodic rectal bleeding ?Medicines:                Monitored Anesthesia Care ?Procedure:                Pre-Anesthesia Assessment: ?                          - Prior to the procedure, a History and Physical  ?                          was performed, and patient medications and  ?                          allergies were reviewed. The patient's tolerance of  ?                          previous anesthesia was also reviewed. The risks  ?                          and benefits of the procedure and the sedation  ?                          options and risks were discussed with the patient.  ?                          All questions were answered, and informed consent  ?  was obtained. Prior Anticoagulants: The patient has  ?                          taken no previous anticoagulant or antiplatelet  ?                          agents. ASA Grade Assessment: II - A patient with  ?                          mild systemic disease. After reviewing the risks  ?                          and benefits, the patient was deemed in  ?                          satisfactory condition to undergo the procedure. ?                          After obtaining informed consent,  the colonoscope  ?                          was passed under direct vision. Throughout the  ?                          procedure, the patient's blood pressure, pulse, and  ?                          oxygen saturations were monitored continuously. The  ?                          Olympus CF-HQ190L (Serial# [id number]) Colonoscope was  ?                          introduced through the anus and advanced to the the  ?                          cecum, identified by appendiceal orifice and  ?                          ileocecal valve. The colonoscopy was somewhat  ?                          difficult due to a redundant colon and significant  ?                          looping. Successful completion of the procedure was  ?                          aided by using manual pressure and straightening  ?                          and shortening the scope to obtain bowel loop  ?  reduction. The patient tolerated the procedure  ?                          well. The quality of the bowel preparation was  ?                          excellent. The ileocecal valve, appendiceal  ?                          orifice, and rectum were photographed. The bowel  ?                          preparation used was Plenvu. ?Scope In: 12:00:03 PM ?Scope Out: 12:19:11 PM ?Scope Withdrawal Time: 0 hours 12 minutes 57 seconds  ?Total Procedure Duration: 0 hours 19 minutes 8 seconds  ?Findings:                 The perianal and digital rectal examinations were  ?                          normal. ?                          Repeat examination of right colon under NBI  ?                          performed. ?                          Three sessile and semi-sessile polyps were found in  ?                          the proximal descending colon and distal transverse  ?                          colon. The polyps were 3 to 5 mm in size. These  ?                          polyps were removed with a cold snare. Resection  ?                          and  retrieval were complete. ?                          Internal hemorrhoids were found. The hemorrhoids  ?                          were small. ?                          The exam was otherwise without abnormality on  ?                          direct and retroflexion views. ?Complications:            No immediate complications. ?Estimated Blood Loss:     Estimated  blood loss was minimal. ?Impression:               - Three 3 to 5 mm polyps in the proximal descending  ?                          colon and in the distal transverse colon, removed  ?                          with a cold snare. Resected and retrieved. ?                          - Internal hemorrhoids. ?                          - The examination was otherwise normal on direct  ?                          and retroflexion views. ?Recommendation:           - Patient has a contact number available for  ?                          emergencies. The signs and symptoms of potential  ?                          delayed complications were discussed with the  ?                          patient. Return to normal activities tomorrow.  ?                          Written discharge instructions were provided to the  ?                          patient. ?                          - Resume previous diet. ?                          - Continue present medications. ?                          - Await pathology results. ?                          - Repeat colonoscopy in 3 years for surveillance. ?Lizmary Nader L. Loletha Carrow, MD ?10/12/2021 12:26:41 PM ?This report has been signed electronically. ?

## 2021-10-12 NOTE — Progress Notes (Signed)
VS by CW  Pt's states no medical or surgical changes since previsit or office visit.  

## 2021-10-12 NOTE — Progress Notes (Signed)
Occ PVC's noted ?

## 2021-10-12 NOTE — Progress Notes (Signed)
Pt non-responsive, VVS, Report to RN  °

## 2021-10-12 NOTE — Progress Notes (Signed)
Called to room to assist during endoscopic procedure.  Patient ID and intended procedure confirmed with present staff. Received instructions for my participation in the procedure from the performing physician.  

## 2021-10-12 NOTE — Patient Instructions (Signed)
Handout on polyps provided  ? ?Await pathology results.  ? ?Continue current medications.  ? ?Repeat colonoscopy  in 3 years for surveillance. ? ?YOU HAD AN ENDOSCOPIC PROCEDURE TODAY AT [facility]:   Refer to the procedure report that was given to you for any specific questions about what was found during the examination.  If the procedure report does not answer your questions, please call your gastroenterologist to clarify.  If you requested that your care partner not be given the details of your procedure findings, then the procedure report has been included in a sealed envelope for you to review at your convenience later. ? ?YOU SHOULD EXPECT: Some feelings of bloating in the abdomen. Passage of more gas than usual.  Walking can help get rid of the air that was put into your GI tract during the procedure and reduce the bloating. If you had a lower endoscopy (such as a colonoscopy or flexible sigmoidoscopy) you may notice spotting of blood in your stool or on the toilet paper. If you underwent a bowel prep for your procedure, you may not have a normal bowel movement for a few days. ? ?Please Note:  You might notice some irritation and congestion in your nose or some drainage.  This is from the oxygen used during your procedure.  There is no need for concern and it should clear up in a day or so. ? ?SYMPTOMS TO REPORT IMMEDIATELY: ? ?Following lower endoscopy (colonoscopy or flexible sigmoidoscopy): ? Excessive amounts of blood in the stool ? Significant tenderness or worsening of abdominal pains ? Swelling of the abdomen that is new, acute ? Fever of 100?F or higher ? ? ?For urgent or emergent issues, a gastroenterologist can be reached at any hour by calling [phone]. ?Do not use MyChart messaging for urgent concerns.  ? ? ?DIET:  We do recommend a small meal at first, but then you may proceed to your regular diet.  Drink plenty of fluids but you should avoid alcoholic beverages for 24  hours. ? ?ACTIVITY:  You should plan to take it easy for the rest of today and you should NOT DRIVE or use heavy machinery until tomorrow (because of the sedation medicines used during the test).   ? ?FOLLOW UP: ?Our staff will call the number listed on your records 48-72 hours following your procedure to check on you and address any questions or concerns that you may have regarding the information given to you following your procedure. If we do not reach you, we will leave a message.  We will attempt to reach you two times.  During this call, we will ask if you have developed any symptoms of COVID 19. If you develop any symptoms (ie: fever, flu-like symptoms, shortness of breath, cough etc.) before then, please call [phone].  If you test positive for Covid 19 in the 2 weeks post procedure, please call and report this information to [place].   ? ?If any biopsies were taken you will be contacted by phone or by letter within the next 1-3 weeks.  Please call us at [phone] if you have not heard about the biopsies in 3 weeks.  ? ? ?SIGNATURES/CONFIDENTIALITY: ?You and/or your care partner have signed paperwork which will be entered into your electronic medical record.  These signatures attest to the fact that that the information above on your After Visit Summary has been reviewed and is understood.  Full responsibility of the confidentiality of this discharge information  lies with you and/or your care-partner. ? ? ?

## 2021-10-14 ENCOUNTER — Telehealth: Payer: Self-pay

## 2021-10-14 ENCOUNTER — Encounter: Payer: Self-pay | Admitting: Gastroenterology

## 2021-10-14 NOTE — Telephone Encounter (Signed)
?  Follow up Call- ? ? ?  10/12/2021  ? 11:03 AM 07/10/2019  ?  7:49 AM  ?Call back number  ?Post procedure Call Back phone  # [phone]  ?Permission to leave phone message Yes Yes  ?  ? ?Patient questions: ? ?Do you have a fever, pain , or abdominal swelling? No. ?Pain Score  0 * ? ?Have you tolerated food without any problems? Yes.   ? ?Have you been able to return to your normal activities? Yes.   ? ?Do you have any questions about your discharge instructions: ?Diet   No. ?Medications  No. ?Follow up visit  No. ? ?Do you have questions or concerns about your Care? No. ? ?Actions: ?* If pain score is 4 or above: ?No action needed, pain <4. ? ? ?

## 2021-10-24 ENCOUNTER — Encounter: Payer: Self-pay | Admitting: Family Medicine

## 2021-10-25 DIAGNOSIS — H6981 Other specified disorders of Eustachian tube, right ear: Secondary | ICD-10-CM | POA: Diagnosis not present

## 2021-11-03 ENCOUNTER — Telehealth: Payer: Self-pay | Admitting: Pharmacist

## 2021-11-03 NOTE — Progress Notes (Signed)
? ? ?Chronic Care Management ?Pharmacy Assistant  ? ?Name: Karen Jones  MRN: [id number] DOB: Nov 22, 1950 ? ? ?Reason for Encounter: Disease State - General Adherence Call  ?  ? ?Recent office visits:  ?06/09/21 Annye Asa, MD - Family Medicine - Hypothyroidism - Labs were ordered. CXR ordered. Follow up in 1 year.  ? ?05/24/21 Annye Asa, MD - Family Medicine - Hypothyroidism - No notes available.  ? ? ?Recent consult visits:  ?10/25/21 Melida Quitter, MD - Otolaryngology - Eustachian tube dysfunction - Phenol was applied to the anterior inferior quadrant of the tympanic membrane. After the area was numb, a radial incision was made with a myringotomy knife and the middle ear was suctioned. A modified Richards T-tube was placed in the site and Ciprodex drops and a cotton ball were added. She tolerated the procedure well without complication. Follow up as scheduled.  ? ?09/29/21 Allyn Kenner - Dermatology - Melanocytic nevi or trunk - No notes available.  ? ?09/08/21 Leslye Peer, MD - Pulmonology - trial of ICS/LABA for 8 weeks, can dc if not noticing much effect on any activity limitation due to DOE. Follow up as scheduled.  ? ?08/30/21 Alcide Evener MD - Inf Disease - Bronchiectasis - Labs were ordered. No medication changes. Follow up if no improvement.  ? ?08/27/21 Wilfrid Lund III MD - GI - Colon polyps - Referral to gastroenterology placed. PEG-KCl-NaCl-NaSulf-Na Asc-C (PLENVU) 140 g SOLR prescribed. Colonoscopy to be scheduled.  ? ?07/22/21 Leslye Peer MD - Bronchiectasis - No changes noted. Follow up as scheduled.  ? ?06/23/21 Leslye Peer MD - Bronchiectasis - Labs were ordered. albuterol (VENTOLIN HFA) 108 (90 Base) MCG/ACT inhaler prescribed. RF, CCP, Ig levels, IgE, A1AT - discuss MBS next visit if all unrevealing PFTs next available f noninvasive workup unrevealing then consideration of bronchoscopy, particularly if there is unexplained lung function impairment albuterol 2 puffs BID.  lutter valve 10 slow but firm puffs twice daily  ? ?06/16/21 Prudencio Pair MD - Labs were ordered. No abx prescribed. Follow up in 10 weeks.  ? ?06/03/21 Melida Quitter - Otolaryngology - No changes noted. Follow up as scheduled.  ? ? ?Hospital visits: 08/17/21 ?Medication Reconciliation was completed by comparing discharge summary, patient?s EMR and Pharmacy list, and upon discussion with patient. ? ?Admitted to the hospital on 08/17/21 due to Bronchiectasis. Discharge date was 08/17/21. Discharged from [facility].   ? ?New?Medications Started at [facility] Discharge:?? ?None noted.  ? ?Medication Changes at Hospital Discharge: ?None noted.  ? ?Medications Discontinued at Hospital Discharge: ?None noted. ? ?Medications that remain the same after Hospital Discharge:??  ?All other medications will remain the same.   ? ?Medications: ?Outpatient Encounter Medications as of 11/03/2021  ?Medication Sig  ? budesonide-formoterol (SYMBICORT) 80-4.5 MCG/ACT inhaler Inhale 2 puffs into the lungs in the morning and at bedtime.  ? cholecalciferol (VITAMIN D3) 25 MCG (1000 UNIT) tablet Take 1,000 Units by mouth daily.  ? clobetasol (TEMOVATE) 0.05 % external solution Apply topically. (Patient not taking: Reported on 10/12/2021)  ? furosemide (LASIX) 20 MG tablet Take 1 tablet (20 mg total) by mouth daily as needed.  ? ibuprofen (ADVIL) 200 MG tablet Take 200-400 mg by mouth every 8 (eight) hours as needed for moderate pain.  ? levothyroxine (SYNTHROID) 75 MCG tablet TAKE 1 TABLET (75 MCG TOTAL) BY MOUTH DAILY WITH BREAKFAST.  ? loratadine (CLARITIN) 10 MG tablet Take 10 mg by mouth daily.  ? meclizine (ANTIVERT) 25 MG tablet Take 1  tablet (25 mg total) by mouth 3 (three) times daily as needed for dizziness.  ? tretinoin (RETIN-A) 0.05 % cream Apply 1 application topically at bedtime.  ? ?No facility-administered encounter medications on file as of 11/03/2021.  ? ? ?Have you had any problems recently with your health? ? ? ?Have you had  any problems with your pharmacy? ? ? ?What issues or side effects are you having with your medications? ? ? ?What would you like me to pass along to Leata Mouse, CPP for them to help you with?  ? ? ?What can we do to take care of you better? ? ? ? ?Care Gaps ? ?AWV: done 08/19/21 ?Colonoscopy: done 10/12/21 ?DM Eye Exam: N/A ?DM Foot Exam: N/A ?Microalbumin: N/A ?HbgAIC: done 11/23/15 (6.0) ?DEXA: done 07/30/20 ?Mammogram: done 01/22/21 ? ? ?Star Rating Drugs: ?No Star Rating Drugs noted.  ? ? ?Future Appointments  ?Date Time Provider [place]  ?09/01/2022 10:30 AM [facility]  ? ? ? ?Liza Showfety, CCMA ?Clinical Pharmacist Assistant  ?[phone] ? ? ?

## 2021-11-22 ENCOUNTER — Encounter: Payer: Self-pay | Admitting: Registered Nurse

## 2021-11-22 ENCOUNTER — Telehealth (INDEPENDENT_AMBULATORY_CARE_PROVIDER_SITE_OTHER): Payer: Medicare Other | Admitting: Registered Nurse

## 2021-11-22 ENCOUNTER — Other Ambulatory Visit: Payer: Self-pay

## 2021-11-22 DIAGNOSIS — U071 COVID-19: Secondary | ICD-10-CM

## 2021-11-22 MED ORDER — NIRMATRELVIR/RITONAVIR (PAXLOVID) TABLET (RENAL DOSING): 2.0000 | ORAL_TABLET | Freq: Two times a day (BID) | ORAL | 0 refills | Status: AC

## 2021-11-22 MED ORDER — BENZONATATE 100 MG PO CAPS: 100.0000 mg | ORAL_CAPSULE | Freq: Three times a day (TID) | ORAL | 0 refills | Status: DC | PRN

## 2021-11-22 NOTE — Progress Notes (Signed)
? ? ?Telemedicine Encounter- SOAP NOTE Established Patient ? ?This video encounter was conducted with the patient's (or proxy's) verbal consent via audio telecommunications: yes/no: Yes ?Patient was instructed to have this encounter in a suitably private space; and to only have persons present to whom they give permission to participate. In addition, patient identity was confirmed by use of name plus two identifiers (DOB and address).  I discussed the limitations, risks, security and privacy concerns of performing an evaluation and management service by telephone and the availability of in person appointments. I also discussed with the patient that there may be a patient responsible charge related to this service. The patient expressed understanding and agreed to proceed. ? ?I spent a total of 16 minutes talking with the patient or their proxy. ? ?Patient at home ?Provider in office ? ?Participants: Kathrin Ruddy, NP and [name] ? ?Chief Complaint  ?Patient presents with  ? Covid Positive  ?  Patient states she tested positive for covid this morning. Patient states she has a Cough, sore throat, and head congestion. Patient has took some Claritin and tylenol cold and flu   ? ? ?Subjective  ? ?Karen Jones is a 71 y.o. established patient. Video visit today for COVID+ ? ?HPI ?Recently traveled to [place].   ?Started Sunday. They returned on Friday.  ?Last night started with warm flashes, fatigue, chills. Started coughing late last night.  ?Took home COVID test this morning, positive.  ? ?Has been taking claritin and tylenol cold + flu.  ?These have helped somewhat.  ? ?Interested in determining accuracy of test and interested in antivirals.  ? ?Patient Active Problem List  ? Diagnosis Date Noted  ? Lung nodule 08/29/2021  ? Vaccine counseling 08/29/2021  ? Bronchiectasis without complication ([place])   ? Lower extremity edema 06/09/2021  ? OAB (overactive bladder) 06/08/2020  ? Hypothyroidism 04/21/2016   ? Obesity (BMI 30-39.9) 04/21/2016  ? Varicose veins 03/15/2011  ? ? ?Past Medical History:  ?Diagnosis Date  ? Allergy   ? Anemia   ? Benign positional vertigo   ? Cancer [facility])   ? skin  ? Colon polyps   ? Hypothyroidism   ? Lung nodule 08/29/2021  ? OAB (overactive bladder)   ? Pain in limb   ? Pneumonia   ? Thyroid disease   ? Vaccine counseling 08/29/2021  ? ? ?Current Outpatient Medications  ?Medication Sig Dispense Refill  ? benzonatate (TESSALON) 100 MG capsule Take 1 capsule (100 mg total) by mouth 3 (three) times daily as needed for cough. 20 capsule 0  ? budesonide-formoterol (SYMBICORT) 80-4.5 MCG/ACT inhaler Inhale 2 puffs into the lungs in the morning and at bedtime. 1 each 12  ? cholecalciferol (VITAMIN D3) 25 MCG (1000 UNIT) tablet Take 1,000 Units by mouth daily.    ? clobetasol (TEMOVATE) 0.05 % external solution Apply topically.    ? furosemide (LASIX) 20 MG tablet Take 1 tablet (20 mg total) by mouth daily as needed. 30 tablet 3  ? ibuprofen (ADVIL) 200 MG tablet Take 200-400 mg by mouth every 8 (eight) hours as needed for moderate pain.  0  ? levothyroxine (SYNTHROID) 75 MCG tablet TAKE 1 TABLET (75 MCG TOTAL) BY MOUTH DAILY WITH BREAKFAST. 90 tablet 0  ? loratadine (CLARITIN) 10 MG tablet Take 10 mg by mouth daily.    ? meclizine (ANTIVERT) 25 MG tablet Take 1 tablet (25 mg total) by mouth 3 (three) times daily as needed for dizziness. 30 tablet  0  ? nirmatrelvir/ritonavir EUA, renal dosing, (PAXLOVID) 10 x 150 MG & 10 x '100MG'$  TABS Take 2 tablets by mouth 2 (two) times daily for 5 days. (Take nirmatrelvir 150 mg one tablet twice daily for 5 days and ritonavir 100 mg one tablet twice daily for 5 days) Patient GFR is 59 20 tablet 0  ? tretinoin (RETIN-A) 0.05 % cream Apply 1 application topically at bedtime.    ? ?No current facility-administered medications for this visit.  ? ? ?Allergies  ?Allergen Reactions  ? Penicillins Shortness Of Breath and Rash  ?  rash, difficulty breathing  ? Levofloxacin    ?  Myalgias (intolerance)  ? ? ?Social History  ? ?Socioeconomic History  ? Marital status: Married  ?  Spouse name: Not on file  ? Number of children: Not on file  ? Years of education: Not on file  ? Highest education level: Not on file  ?Occupational History  ? Occupation: Retired  ?Tobacco Use  ? Smoking status: Never  ? Smokeless tobacco: Never  ?Vaping Use  ? Vaping Use: Never used  ?Substance and Sexual Activity  ? Alcohol use: Yes  ?  Alcohol/week: 5.0 standard drinks  ?  Types: 3 Glasses of wine, 2 Shots of liquor per week  ? Drug use: No  ? Sexual activity: Not Currently  ?Other Topics Concern  ? Not on file  ?Social History Narrative  ? Not on file  ? ?Social Determinants of Health  ? ?Financial Resource Strain: Low Risk   ? Difficulty of Paying Living Expenses: Not hard at all  ?Food Insecurity: No Food Insecurity  ? Worried About Charity fundraiser in the Last Year: Never true  ? Ran Out of Food in the Last Year: Never true  ?Transportation Needs: No Transportation Needs  ? Lack of Transportation (Medical): No  ? Lack of Transportation (Non-Medical): No  ?Physical Activity: Insufficiently Active  ? Days of Exercise per Week: 3 days  ? Minutes of Exercise per Session: 40 min  ?Stress: No Stress Concern Present  ? Feeling of Stress : Not at all  ?Social Connections: Socially Integrated  ? Frequency of Communication with Friends and Family: Twice a week  ? Frequency of Social Gatherings with Friends and Family: Twice a week  ? Attends Religious Services: More than 4 times per year  ? Active Member of Clubs or Organizations: Yes  ? Attends Archivist Meetings: More than 4 times per year  ? Marital Status: Married  ?Intimate Partner Violence: Not At Risk  ? Fear of Current or Ex-Partner: No  ? Emotionally Abused: No  ? Physically Abused: No  ? Sexually Abused: No  ? ? ?Review of Systems  ?Constitutional: Negative.   ?HENT:  Positive for congestion and sore throat.   ?Eyes: Negative.    ?Respiratory: Negative.    ?Cardiovascular: Negative.   ?Gastrointestinal: Negative.   ?Genitourinary: Negative.   ?Musculoskeletal: Negative.   ?Skin: Negative.   ?Neurological: Negative.   ?Endo/Heme/Allergies: Negative.   ?Psychiatric/Behavioral: Negative.    ?All other systems reviewed and are negative. ? ?Objective  ? ?Vitals as reported by the patient: ?There were no vitals filed for this visit. ? ?Dalani was seen today for covid positive. ? ?Diagnoses and all orders for this visit: ? ?COVID-19 ?-     nirmatrelvir/ritonavir EUA, renal dosing, (PAXLOVID) 10 x 150 MG & 10 x '100MG'$  TABS; Take 2 tablets by mouth 2 (two) times daily for 5 days. (Take  nirmatrelvir 150 mg one tablet twice daily for 5 days and ritonavir 100 mg one tablet twice daily for 5 days) Patient GFR is 59 ?-     benzonatate (TESSALON) 100 MG capsule; Take 1 capsule (100 mg total) by mouth 3 (three) times daily as needed for cough. ? ? ?PLAN ?Paxlovid as above, last GFR 59. Reviewed risks, benefits, and side effects, pt voices understanding. ?Tessalon for cough relief ?Reviewed isolation guidelines ?Patient encouraged to call clinic with any questions, comments, or concerns. ? ? ?I discussed the assessment and treatment plan with the patient. The patient was provided an opportunity to ask questions and all were answered. The patient agreed with the plan and demonstrated an understanding of the instructions. ?  ?The patient was advised to call back or seek an in-person evaluation if the symptoms worsen or if the condition fails to improve as anticipated. ? ?I provided 16 minutes of face-to-face time during this encounter. ? ?Maximiano Coss, NP ? ?

## 2021-11-22 NOTE — Patient Instructions (Signed)
° ° ° °  If you have lab work done today you will be contacted with your lab results within the next 2 weeks.  If you have not heard from us then please contact us. The fastest way to get your results is to register for My Chart. ° ° °IF you received an x-ray today, you will receive an invoice from [facility]. Please contact [facility] at [phone] with questions or concerns regarding your invoice.  ° °IF you received labwork today, you will receive an invoice from [facility]. Please contact [facility] at [phone] with questions or concerns regarding your invoice.  ° °Our billing staff will not be able to assist you with questions regarding bills from these companies. ° °You will be contacted with the lab results as soon as they are available. The fastest way to get your results is to activate your My Chart account. Instructions are located on the last page of this paperwork. If you have not heard from us regarding the results in 2 weeks, please contact this office. °  ° ° ° °

## 2021-12-08 ENCOUNTER — Other Ambulatory Visit: Payer: Self-pay | Admitting: Family Medicine

## 2021-12-08 DIAGNOSIS — E039 Hypothyroidism, unspecified: Secondary | ICD-10-CM

## 2021-12-09 ENCOUNTER — Other Ambulatory Visit: Payer: Self-pay | Admitting: Family Medicine

## 2021-12-09 DIAGNOSIS — Z1231 Encounter for screening mammogram for malignant neoplasm of breast: Secondary | ICD-10-CM

## 2022-02-01 ENCOUNTER — Ambulatory Visit
Admission: RE | Admit: 2022-02-01 | Discharge: 2022-02-01 | Disposition: A | Discharge status: Home / Self Care | Payer: Medicare Other | Source: Ambulatory Visit | Attending: Family Medicine | Admitting: Family Medicine

## 2022-02-01 DIAGNOSIS — Z1231 Encounter for screening mammogram for malignant neoplasm of breast: Secondary | ICD-10-CM

## 2022-02-08 ENCOUNTER — Telehealth: Payer: Self-pay | Admitting: Family Medicine

## 2022-02-08 DIAGNOSIS — N644 Mastodynia: Secondary | ICD-10-CM

## 2022-02-08 NOTE — Telephone Encounter (Signed)
Caller name: Karen Jones (pt)  On DPR? :yes/no: Yes  Call back number: [phone]   Provider they see: Dr. Birdie Riddle  Reason for call: Pt went 02/01/22 for screening mammogram at the [facility] on [facility], but was told that she needed a diagnostic mammogram instead. Pt was advised to call our office to get Dr. Birdie Riddle to order the diagnostic mammogram.

## 2022-02-08 NOTE — Telephone Encounter (Signed)
I need to know if she is having symptoms or if there is a particular reason for the diagnostic mammo rather than the usual screening mammo

## 2022-02-09 NOTE — Telephone Encounter (Signed)
Diagnostic mammo ordered at pt's request

## 2022-02-10 ENCOUNTER — Other Ambulatory Visit: Payer: Self-pay | Admitting: Family Medicine

## 2022-02-10 DIAGNOSIS — N644 Mastodynia: Secondary | ICD-10-CM

## 2022-02-14 ENCOUNTER — Ambulatory Visit
Admission: RE | Admit: 2022-02-14 | Discharge: 2022-02-14 | Disposition: A | Discharge status: Home / Self Care | Payer: Medicare Other | Source: Ambulatory Visit | Attending: Family Medicine | Admitting: Family Medicine

## 2022-02-14 DIAGNOSIS — N644 Mastodynia: Secondary | ICD-10-CM

## 2022-02-14 DIAGNOSIS — R928 Other abnormal and inconclusive findings on diagnostic imaging of breast: Secondary | ICD-10-CM | POA: Diagnosis not present

## 2022-03-08 ENCOUNTER — Other Ambulatory Visit: Payer: Self-pay | Admitting: Family Medicine

## 2022-03-08 DIAGNOSIS — E039 Hypothyroidism, unspecified: Secondary | ICD-10-CM

## 2022-03-11 ENCOUNTER — Encounter: Payer: Self-pay | Admitting: Family Medicine

## 2022-03-11 ENCOUNTER — Ambulatory Visit (INDEPENDENT_AMBULATORY_CARE_PROVIDER_SITE_OTHER): Payer: Medicare Other | Admitting: Family Medicine

## 2022-03-11 VITALS — BP 138/80 | HR 63 | Temp 97.2°F | Resp 19 | Ht 64.0 in | Wt 193.0 lb

## 2022-03-11 DIAGNOSIS — R051 Acute cough: Secondary | ICD-10-CM

## 2022-03-11 DIAGNOSIS — J471 Bronchiectasis with (acute) exacerbation: Secondary | ICD-10-CM | POA: Diagnosis not present

## 2022-03-11 LAB — POC COVID19 BINAXNOW: SARS Coronavirus 2 Ag: NEGATIVE

## 2022-03-11 MED ORDER — GUAIFENESIN-CODEINE 100-10 MG/5ML PO SYRP: 10.0000 mL | ORAL_SOLUTION | Freq: Three times a day (TID) | ORAL | 0 refills | Status: DC | PRN

## 2022-03-11 MED ORDER — AZITHROMYCIN 250 MG PO TABS: ORAL_TABLET | ORAL | 0 refills | Status: AC

## 2022-03-11 NOTE — Progress Notes (Signed)
   Subjective:    Patient ID: Karen Jones, female    DOB: 03-20-1951, 71 y.o.   MRN: [id number]  HPI Cough- 'i got a horrible cough again'.  Came back from [facility] on Sunday.  Developed sore throat on Monday.  Tuesday developed cough and congestion.  'i felt like it was in my chest'.  Cough is intermittently productive.  No fevers or chills.  No body aches.  + fatigue.  No known sick contacts.   Review of Systems For ROS see HPI     Objective:   Physical Exam Vitals reviewed.  Constitutional:      General: She is not in acute distress.    Appearance: Normal appearance. She is well-developed. She is not ill-appearing.  HENT:     Head: Normocephalic and atraumatic.     Nose: Nose normal.     Comments: No TTP over frontal or maxillary sinuses    Mouth/Throat:     Mouth: Mucous membranes are moist.     Pharynx: No oropharyngeal exudate or posterior oropharyngeal erythema.  Eyes:     Conjunctiva/sclera: Conjunctivae normal.     Pupils: Pupils are equal, round, and reactive to light.  Cardiovascular:     Rate and Rhythm: Normal rate and regular rhythm.     Heart sounds: Normal heart sounds. No murmur heard. Pulmonary:     Effort: Pulmonary effort is normal. No respiratory distress.     Breath sounds: Normal breath sounds. No wheezing.     Comments: + Hacking cough Musculoskeletal:     Cervical back: Normal range of motion and neck supple.  Lymphadenopathy:     Cervical: No cervical adenopathy.  Skin:    General: Skin is warm and dry.  Neurological:     General: No focal deficit present.     Mental Status: She is alert and oriented to person, place, and time.  Psychiatric:        Mood and Affect: Mood normal.        Behavior: Behavior normal.        Thought Content: Thought content normal.           Assessment & Plan:   Bronchiectasis exacerbation- new.  Pt's COVID test was negative in office.  Given her underlying lung conditions, will start [place].  Cough meds prn.   Reviewed supportive care and red flags that should prompt return.  Pt expressed understanding and is in agreement w/ plan.

## 2022-03-11 NOTE — Patient Instructions (Signed)
Follow up as needed or as scheduled START the Zpack as directed USE Mucinex DM during the day for cough and congestion relief USE the codeine cough syrup for nights/weekends (may cause drowsiness) Drink LOTS of fluids REST! Please let me or pulmonary know if things are not improving Call with any questions or concerns Hang in there!

## 2022-03-15 ENCOUNTER — Ambulatory Visit: Payer: Medicare Other | Admitting: Student

## 2022-03-20 NOTE — Progress Notes (Unsigned)
Synopsis: Referred for bronchiectasis by Midge Minium, MD  Subjective:   PATIENT ID: Karen Jones: female DOB: [birth year], MRN: [id number]  No chief complaint on file.  71yF with history of allergy, R t-tube placement 2017 who is referred for bronchiectasis.  She says she went to [place] 04/11/21 on a bus trip and several folks developed sinus issues/cough. She says no one had covid but no one, including her, was tested for covid. She didn't start feeling bad until 10/2. Got to [facility] and seemed to really worsen after [place] dinner/show, remembers it being quite cold that night. Tried various symptomatic measures without improvement. A great deal of chest congestion, sinus congestion, very productive cough. Maybe low grade fever, no night sweats, unintentional weight loss.   Husband after they got back to [place] had similar symptoms, was prescribed doxy with improvement in his symptoms.   She was eventually seen by physician with telehealth visit through [facility] 10/14 who prescribed prednisone, z pack, tessalon perles, sudafed, albuterol. Re-upped prednisone at a visit a week later with FNP. Eventually she was also prescribed doxycycline for 10d course which dramatically improved symptoms. Subsequently developed pleurtic CP so CT Chest obtained 11/21 showing bronchiectasis, mucus plugging, atelectasis. Pleuritic CP persists. She has no DOE.   She has occasional issues with coughing while she's eating/drinking.   Mother died of emphysema  She has had some secondhand smoke exposure growing up.  Interval HPI:  Continued on ICS/LABA. Recent acute cough started on z pack with concern for bronchiectasis exacerbation. Plan for in office spirometry today. Bronchoscopy cx all finalized, unremarkable.  Otherwise pertinent review of systems is negative.      Past Medical History:  Diagnosis Date   Allergy    Anemia    Benign positional vertigo    Cancer (HCC)    skin    Colon polyps    Hypothyroidism    Lung nodule 08/29/2021   OAB (overactive bladder)    Pain in limb    Pneumonia    Thyroid disease    Vaccine counseling 08/29/2021     Family History  Problem Relation Age of Onset   Emphysema Mother    Colon cancer Father 2   Cancer Father    Heart disease Father    Heart attack Father    Colon polyps Brother    Breast cancer Neg Hx    Esophageal cancer Neg Hx    Rectal cancer Neg Hx    Stomach cancer Neg Hx      Past Surgical History:  Procedure Laterality Date   ABDOMINAL HYSTERECTOMY     BILATERAL OOPHORECTOMY  1995   BRONCHIAL WASHINGS  08/17/2021   Procedure: BRONCHIAL WASHINGS;  Surgeon: Maryjane Hurter, MD;  Location: [facility] ENDOSCOPY;  Service: Pulmonary;;   COLONOSCOPY  1995,1997,2001,2005,2010   polyps removed every colonoscopy except 1 time,per pt   HAND SURGERY     to remove webbing between fingers    HEMOSTASIS CONTROL  08/17/2021   Procedure: HEMOSTASIS CONTROL;  Surgeon: Maryjane Hurter, MD;  Location: [facility] ENDOSCOPY;  Service: Pulmonary;;   POLYPECTOMY     TONSILLECTOMY     TUBAL LIGATION     UMBILICAL HERNIA REPAIR     VIDEO BRONCHOSCOPY N/A 08/17/2021   Procedure: VIDEO BRONCHOSCOPY WITHOUT FLUORO;  Surgeon: Maryjane Hurter, MD;  Location: [facility] ENDOSCOPY;  Service: Pulmonary;  Laterality: N/A;    Social History   Socioeconomic History   Marital status: Married  Spouse name: Not on file   Number of children: Not on file   Years of education: Not on file   Highest education level: Not on file  Occupational History   Occupation: Retired  Tobacco Use   Smoking status: Never   Smokeless tobacco: Never  Vaping Use   Vaping Use: Never used  Substance and Sexual Activity   Alcohol use: Yes    Alcohol/week: 5.0 standard drinks of alcohol    Types: 3 Glasses of wine, 2 Shots of liquor per week   Drug use: No   Sexual activity: Not Currently  Other Topics Concern   Not on file  Social History Narrative   Not on  file   Social Determinants of Health   Financial Resource Strain: Low Risk  (08/19/2021)   Overall Financial Resource Strain (CARDIA)    Difficulty of Paying Living Expenses: Not hard at all  Food Insecurity: No Food Insecurity (08/19/2021)   Hunger Vital Sign    Worried About Running Out of Food in the Last Year: Never true    Ran Out of Food in the Last Year: Never true  Transportation Needs: No Transportation Needs (08/19/2021)   PRAPARE - Hydrologist (Medical): No    Lack of Transportation (Non-Medical): No  Physical Activity: Insufficiently Active (08/19/2021)   Exercise Vital Sign    Days of Exercise per Week: 3 days    Minutes of Exercise per Jones: 40 min  Stress: No Stress Concern Present (08/19/2021)   [place]    Feeling of Stress : Not at all  Social Connections: [place] (08/19/2021)   Social Connection and Isolation Panel [NHANES]    Frequency of Communication with Friends and Family: Twice a week    Frequency of Social Gatherings with Friends and Family: Twice a week    Attends Religious Services: More than 4 times per year    Active Member of [employer] or Organizations: Yes    Attends Music therapist: More than 4 times per year    Marital Status: Married  Human resources officer Violence: Not At Risk (08/19/2021)   Humiliation, Afraid, Rape, and Kick questionnaire    Fear of Current or Ex-Partner: No    Emotionally Abused: No    Physically Abused: No    Sexually Abused: No     Allergies  Allergen Reactions   Penicillins Shortness Of Breath and Rash    rash, difficulty breathing   Levofloxacin     Myalgias (intolerance)     Outpatient Medications Prior to Visit  Medication Sig Dispense Refill   benzonatate (TESSALON) 100 MG capsule Take 1 capsule (100 mg total) by mouth 3 (three) times daily as needed for cough. 20 capsule 0   budesonide-formoterol  (SYMBICORT) 80-4.5 MCG/ACT inhaler Inhale 2 puffs into the lungs in the morning and at bedtime. 1 each 12   cholecalciferol (VITAMIN D3) 25 MCG (1000 UNIT) tablet Take 1,000 Units by mouth daily.     furosemide (LASIX) 20 MG tablet Take 1 tablet (20 mg total) by mouth daily as needed. 30 tablet 3   guaiFENesin-codeine (ROBITUSSIN AC) 100-10 MG/5ML syrup Take 10 mLs by mouth 3 (three) times daily as needed for cough. 120 mL 0   levothyroxine (SYNTHROID) 75 MCG tablet TAKE 1 TABLET (75 MCG TOTAL) BY MOUTH DAILY WITH BREAKFAST. 90 tablet 0   meclizine (ANTIVERT) 25 MG tablet Take 1 tablet (25 mg total) by  mouth 3 (three) times daily as needed for dizziness. 30 tablet 0   tretinoin (RETIN-A) 0.05 % cream Apply 1 application topically at bedtime.     No facility-administered medications prior to visit.       Objective:   Physical Exam:  General appearance: 71 y.o., female, NAD, conversant  Eyes: anicteric sclerae; PERRL, tracking appropriately HENT: NCAT; MMM Neck: Trachea midline; no lymphadenopathy, no JVD Lungs: CTAB, no crackles, no wheeze, with normal respiratory effort CV: RRR, no murmur  Abdomen: Soft, non-tender; non-distended, BS present  Extremities: No peripheral edema, warm Skin: Normal turgor and texture; no rash Psych: Appropriate affect Neuro: Alert and oriented to person and place, no focal deficit     There were no vitals filed for this visit.      on RA BMI Readings from Last 3 Encounters:  03/11/22 33.13 kg/m  10/12/21 32.96 kg/m  09/08/21 33.09 kg/m   Wt Readings from Last 3 Encounters:  03/11/22 193 lb (87.5 kg)  10/12/21 192 lb (87.1 kg)  09/08/21 192 lb 12.8 oz (87.5 kg)     CBC    Component Value Date/Time   WBC 4.5 08/17/2021 0618   RBC 4.20 08/17/2021 0618   HGB 12.6 08/17/2021 0618   HCT 40.1 08/17/2021 0618   PLT 222 08/17/2021 0618   MCV 95.5 08/17/2021 0618   MCH 30.0 08/17/2021 0618   MCHC 31.4 08/17/2021 0618   RDW 13.6  08/17/2021 0618   LYMPHSABS 1.2 06/09/2021 1041   MONOABS 0.4 06/09/2021 1041   EOSABS 0.1 06/09/2021 1041   BASOSABS 0.1 06/09/2021 1041    Low level eosinophilia A1AT phenotype PI*MM, normal level IgE 3 Ig levels WNL RF negative Endemic fungal serologies negative  BAL AFB, fungal cx pending  Chest Imaging: HRCT Chest 06/14/21 reviewed by me remarkable for lower lobe/RML/lingula predominant bronchiectasis with mucus plugging and atelectasis,scar  Pulmonary Functions Testing Results:    Latest Ref Rng & Units 08/09/2021   11:57 AM  PFT Results  FVC-Pre L 2.43   FVC-Predicted Pre % 82   FVC-Post L 2.43   FVC-Predicted Post % 83   Pre FEV1/FVC % % 63   Post FEV1/FCV % % 71   FEV1-Pre L 1.52   FEV1-Predicted Pre % 69   FEV1-Post L 1.72   DLCO uncorrected ml/min/mmHg 15.02   DLCO UNC% % 77   DLCO corrected ml/min/mmHg 13.99   DLCO COR %Predicted % 72   DLVA Predicted % 84   TLC L 4.73   TLC % Predicted % 93   RV % Predicted % 105    Mild obstruction with BD response, mildly reduced diffusing capacity     Assessment & Plan:   # Bronchiectasis: # Recent LRTI:  In agreement with ID that course seems most consistent with viral LRTI superimposed on background of a more chronic process. Etiology of her bronchiectasis is unclear - whether caused by or predisposing to NTM or other atypical infection, connective tissue disease but screening tests negative, aspiration given lower lobe predominance (at least recurrent macroaspiration seems unlikely by history), immune deficiency but testing for CVID neg.   # Obstructive lung disease Unclear etiology unless caused by her bronchiectasis (burden of which however is mild), alternatively consider longstanding asthma but history really not suggestive of this. Regardless she doesn't sound impressively symptomatic and hasn't had much response to trial of LABA/ICS so far.    Plan: - trial of ICS/LABA for 8 weeks, can dc if not  noticing  much effect on any activity limitation due to DOE (doesn't have much limitation to begin with anyway though) - will follow results of BAL  I spent 45 minutes dedicated to the care of this patient on the date of this encounter to include pre-visit review of records, face-to-face time with the patient discussing conditions above, post visit ordering of testing, clinical documentation with the electronic health record, and communicating necessary findings to members of the patients care team.       Maryjane Hurter, MD [facility] 03/20/2022 5:57 PM

## 2022-03-23 ENCOUNTER — Ambulatory Visit (INDEPENDENT_AMBULATORY_CARE_PROVIDER_SITE_OTHER): Payer: Medicare Other

## 2022-03-23 ENCOUNTER — Encounter: Payer: Self-pay | Admitting: Student

## 2022-03-23 ENCOUNTER — Ambulatory Visit (INDEPENDENT_AMBULATORY_CARE_PROVIDER_SITE_OTHER): Payer: Medicare Other | Admitting: Student

## 2022-03-23 VITALS — BP 128/66 | HR 82 | Temp 98.2°F | Ht 65.0 in | Wt 192.0 lb

## 2022-03-23 DIAGNOSIS — J471 Bronchiectasis with (acute) exacerbation: Secondary | ICD-10-CM | POA: Diagnosis not present

## 2022-03-23 DIAGNOSIS — R053 Chronic cough: Secondary | ICD-10-CM | POA: Diagnosis not present

## 2022-03-23 DIAGNOSIS — R059 Cough, unspecified: Secondary | ICD-10-CM | POA: Diagnosis not present

## 2022-03-23 DIAGNOSIS — J479 Bronchiectasis, uncomplicated: Secondary | ICD-10-CM | POA: Diagnosis not present

## 2022-03-23 MED ORDER — DOXYCYCLINE HYCLATE 100 MG PO TABS: 100.0000 mg | ORAL_TABLET | Freq: Two times a day (BID) | ORAL | 0 refills | Status: DC

## 2022-03-23 MED ORDER — BUDESONIDE-FORMOTEROL FUMARATE 80-4.5 MCG/ACT IN AERO
2.0000 | INHALATION_SPRAY | Freq: Two times a day (BID) | RESPIRATORY_TRACT | 12 refills | Status: DC
Start: 2022-03-23 — End: 2022-07-06

## 2022-03-23 NOTE — Patient Instructions (Addendum)
-   Sputum sample today - Chest x ray today - Prescription for doxycycline 100 mg twice daily for 2 weeks. If need to extend it for another week just call or clinic or send me a my chart message  - symbicort 2 puffs twice daily, rinse mouth after use - flutter valve 10 slow but firm puffs twice daily after each symbicort treatment - take shower, clear nose of crusting and then flonase 1 spray each nostril - mucinex dm twice daily till you're done with antibiotics

## 2022-03-24 MED ORDER — DOXYCYCLINE HYCLATE 100 MG PO TABS: 100.0000 mg | ORAL_TABLET | Freq: Two times a day (BID) | ORAL | 0 refills | Status: DC

## 2022-03-26 LAB — RESPIRATORY CULTURE OR RESPIRATORY AND SPUTUM CULTURE
MICRO NUMBER:: 13852883
RESULT:: NORMAL
SPECIMEN QUALITY:: ADEQUATE

## 2022-03-31 NOTE — Telephone Encounter (Signed)
Dr. Verlee Monte, pt is requesting results of bacterial sputum results. Thanks.

## 2022-04-05 ENCOUNTER — Other Ambulatory Visit: Payer: Self-pay | Admitting: Student

## 2022-04-05 ENCOUNTER — Telehealth: Payer: Self-pay | Admitting: Student

## 2022-04-05 MED ORDER — DOXYCYCLINE HYCLATE 100 MG PO TABS: 100.0000 mg | ORAL_TABLET | Freq: Two times a day (BID) | ORAL | 0 refills | Status: DC

## 2022-04-05 NOTE — Telephone Encounter (Signed)
Called and spoke with patient.  Patient stated Dr. Verlee Monte had told her to call if needed he would send another prescription for Doxycycline to pharmacy.  Patient stated she takes her last dose tonight and is not completely well.  Patient requested another Doxycycline prescription be sent to [facility] [place].  Message routed to Dr. Verlee Monte to advise

## 2022-04-11 MED ORDER — DOXYCYCLINE HYCLATE 100 MG PO TABS: 100.0000 mg | ORAL_TABLET | Freq: Two times a day (BID) | ORAL | 0 refills | Status: DC

## 2022-04-11 NOTE — Telephone Encounter (Signed)
Doxycycline refilled.

## 2022-04-20 DIAGNOSIS — L820 Inflamed seborrheic keratosis: Secondary | ICD-10-CM | POA: Diagnosis not present

## 2022-04-20 DIAGNOSIS — L570 Actinic keratosis: Secondary | ICD-10-CM | POA: Diagnosis not present

## 2022-04-20 DIAGNOSIS — D485 Neoplasm of uncertain behavior of skin: Secondary | ICD-10-CM | POA: Diagnosis not present

## 2022-04-20 DIAGNOSIS — X32XXXD Exposure to sunlight, subsequent encounter: Secondary | ICD-10-CM | POA: Diagnosis not present

## 2022-04-25 DIAGNOSIS — Z23 Encounter for immunization: Secondary | ICD-10-CM | POA: Diagnosis not present

## 2022-05-03 ENCOUNTER — Ambulatory Visit: Payer: Medicare Other | Admitting: Student

## 2022-05-09 LAB — AFB CULTURE WITH SMEAR (NOT AT ARMC)
Acid Fast Culture: NEGATIVE
Acid Fast Smear: NEGATIVE

## 2022-05-10 NOTE — Progress Notes (Unsigned)
Synopsis: Referred for bronchiectasis by Midge Minium, MD  Subjective:   PATIENT ID: Karen Jones: female DOB: [birth year], MRN: [id number]  No chief complaint on file.  71yF with history of allergy, R t-tube placement 2017 who is referred for bronchiectasis.  She says she went to [place] 04/11/21 on a bus trip and several folks developed sinus issues/cough. She says no one had covid but no one, including her, was tested for covid. She didn't start feeling bad until 10/2. Got to [facility] and seemed to really worsen after [place] dinner/show, remembers it being quite cold that night. Tried various symptomatic measures without improvement. A great deal of chest congestion, sinus congestion, very productive cough. Maybe low grade fever, no night sweats, unintentional weight loss.   Husband after they got back to [place] had similar symptoms, was prescribed doxy with improvement in his symptoms.   She was eventually seen by physician with telehealth visit through [facility] 10/14 who prescribed prednisone, z pack, tessalon perles, sudafed, albuterol. Re-upped prednisone at a visit a week later with FNP. Eventually she was also prescribed doxycycline for 10d course which dramatically improved symptoms. Subsequently developed pleurtic CP so CT Chest obtained 11/21 showing bronchiectasis, mucus plugging, atelectasis. Pleuritic CP persists. She has no DOE.   She has occasional issues with coughing while she's eating/drinking.   Mother died of emphysema  She has had some secondhand smoke exposure growing up.  Interval HPI:  Continued on ICS/LABA. Recent acute cough started on z pack with concern for bronchiectasis exacerbation - 5 day course. Some improvement afterward, but then went to the beach and started coughing more once she got back. It is productive most of the time. She does have sinus congestion, postnasal drainage. She uses saline spray for this. Plan for in office spirometry today.  Bronchoscopy cx all finalized, unremarkable.  Tested negative for covid-19 at home as did her husband.  ------------------- Given 3 week course of doxy after last visit, sputum culture no growth.    Otherwise pertinent review of systems is negative.      Past Medical History:  Diagnosis Date   Allergy    Anemia    Benign positional vertigo    Cancer (HCC)    skin   Colon polyps    Hypothyroidism    Lung nodule 08/29/2021   OAB (overactive bladder)    Pain in limb    Pneumonia    Thyroid disease    Vaccine counseling 08/29/2021     Family History  Problem Relation Age of Onset   Emphysema Mother    Colon cancer Father 61   Cancer Father    Heart disease Father    Heart attack Father    Colon polyps Brother    Breast cancer Neg Hx    Esophageal cancer Neg Hx    Rectal cancer Neg Hx    Stomach cancer Neg Hx      Past Surgical History:  Procedure Laterality Date   ABDOMINAL HYSTERECTOMY     BILATERAL OOPHORECTOMY  1995   BRONCHIAL WASHINGS  08/17/2021   Procedure: BRONCHIAL WASHINGS;  Surgeon: Maryjane Hurter, MD;  Location: [facility] ENDOSCOPY;  Service: Pulmonary;;   COLONOSCOPY  1995,1997,2001,2005,2010   polyps removed every colonoscopy except 1 time,per pt   HAND SURGERY     to remove webbing between fingers    HEMOSTASIS CONTROL  08/17/2021   Procedure: HEMOSTASIS CONTROL;  Surgeon: Maryjane Hurter, MD;  Location: [facility] ENDOSCOPY;  Service: Pulmonary;;  POLYPECTOMY     TONSILLECTOMY     TUBAL LIGATION     UMBILICAL HERNIA REPAIR     VIDEO BRONCHOSCOPY N/A 08/17/2021   Procedure: VIDEO BRONCHOSCOPY WITHOUT FLUORO;  Surgeon: Maryjane Hurter, MD;  Location: [facility] ENDOSCOPY;  Service: Pulmonary;  Laterality: N/A;    Social History   Socioeconomic History   Marital status: Married    Spouse name: Not on file   Number of children: Not on file   Years of education: Not on file   Highest education level: Not on file  Occupational History   Occupation: Retired   Tobacco Use   Smoking status: Never   Smokeless tobacco: Never  Vaping Use   Vaping Use: Never used  Substance and Sexual Activity   Alcohol use: Yes    Alcohol/week: 5.0 standard drinks of alcohol    Types: 3 Glasses of wine, 2 Shots of liquor per week   Drug use: No   Sexual activity: Not Currently  Other Topics Concern   Not on file  Social History Narrative   Not on file   Social Determinants of Health   Financial Resource Strain: Low Risk  (08/19/2021)   Overall Financial Resource Strain (CARDIA)    Difficulty of Paying Living Expenses: Not hard at all  Food Insecurity: No Food Insecurity (08/19/2021)   Hunger Vital Sign    Worried About Running Out of Food in the Last Year: Never true    Ran Out of Food in the Last Year: Never true  Transportation Needs: No Transportation Needs (08/19/2021)   PRAPARE - Hydrologist (Medical): No    Lack of Transportation (Non-Medical): No  Physical Activity: Insufficiently Active (08/19/2021)   Exercise Vital Sign    Days of Exercise per Week: 3 days    Minutes of Exercise per Jones: 40 min  Stress: No Stress Concern Present (08/19/2021)   [place]    Feeling of Stress : Not at all  Social Connections: [place] (08/19/2021)   Social Connection and Isolation Panel [NHANES]    Frequency of Communication with Friends and Family: Twice a week    Frequency of Social Gatherings with Friends and Family: Twice a week    Attends Religious Services: More than 4 times per year    Active Member of [employer] or Organizations: Yes    Attends Music therapist: More than 4 times per year    Marital Status: Married  Human resources officer Violence: Not At Risk (08/19/2021)   Humiliation, Afraid, Rape, and Kick questionnaire    Fear of Current or Ex-Partner: No    Emotionally Abused: No    Physically Abused: No    Sexually Abused: No      Allergies  Allergen Reactions   Penicillins Shortness Of Breath and Rash    rash, difficulty breathing   Levofloxacin     Myalgias (intolerance)     Outpatient Medications Prior to Visit  Medication Sig Dispense Refill   benzonatate (TESSALON) 100 MG capsule Take 1 capsule (100 mg total) by mouth 3 (three) times daily as needed for cough. 20 capsule 0   budesonide-formoterol (SYMBICORT) 80-4.5 MCG/ACT inhaler Inhale 2 puffs into the lungs in the morning and at bedtime. 1 each 12   cholecalciferol (VITAMIN D3) 25 MCG (1000 UNIT) tablet Take 1,000 Units by mouth daily.     doxycycline (VIBRA-TABS) 100 MG tablet Take 1 tablet (100 mg total)  by mouth 2 (two) times daily. 14 tablet 0   doxycycline (VIBRA-TABS) 100 MG tablet Take 1 tablet (100 mg total) by mouth 2 (two) times daily. 14 tablet 0   furosemide (LASIX) 20 MG tablet Take 1 tablet (20 mg total) by mouth daily as needed. 30 tablet 3   levothyroxine (SYNTHROID) 75 MCG tablet TAKE 1 TABLET (75 MCG TOTAL) BY MOUTH DAILY WITH BREAKFAST. 90 tablet 0   meclizine (ANTIVERT) 25 MG tablet Take 1 tablet (25 mg total) by mouth 3 (three) times daily as needed for dizziness. 30 tablet 0   tretinoin (RETIN-A) 0.05 % cream Apply 1 application topically at bedtime.     No facility-administered medications prior to visit.       Objective:   Physical Exam:  General appearance: 71 y.o., female, NAD, conversant  Eyes: anicteric sclerae; PERRL, tracking appropriately HENT: NCAT; MMM Neck: Trachea midline; no lymphadenopathy, no JVD Lungs: CTAB, no crackles, no wheeze, with normal respiratory effort CV: RRR, no murmur  Abdomen: Soft, non-tender; non-distended, BS present  Extremities: No peripheral edema, warm Skin: Normal turgor and texture; no rash Psych: Appropriate affect Neuro: Alert and oriented to person and place, no focal deficit     There were no vitals filed for this visit.       on RA BMI Readings from Last 3  Encounters:  03/23/22 31.95 kg/m  03/11/22 33.13 kg/m  10/12/21 32.96 kg/m   Wt Readings from Last 3 Encounters:  03/23/22 192 lb (87.1 kg)  03/11/22 193 lb (87.5 kg)  10/12/21 192 lb (87.1 kg)     CBC    Component Value Date/Time   WBC 4.5 08/17/2021 0618   RBC 4.20 08/17/2021 0618   HGB 12.6 08/17/2021 0618   HCT 40.1 08/17/2021 0618   PLT 222 08/17/2021 0618   MCV 95.5 08/17/2021 0618   MCH 30.0 08/17/2021 0618   MCHC 31.4 08/17/2021 0618   RDW 13.6 08/17/2021 0618   LYMPHSABS 1.2 06/09/2021 1041   MONOABS 0.4 06/09/2021 1041   EOSABS 0.1 06/09/2021 1041   BASOSABS 0.1 06/09/2021 1041    Low level eosinophilia A1AT phenotype PI*MM, normal level IgE 3 Ig levels WNL RF negative Endemic fungal serologies negative  BAL AFB, fungal cx pending  Chest Imaging: HRCT Chest 06/14/21 reviewed by me remarkable for lower lobe/RML/lingula predominant bronchiectasis with mucus plugging and atelectasis,scar  CXR 03/23/22 reviewed by me mild extent of tram tracking lower lobes   Pulmonary Functions Testing Results:    Latest Ref Rng & Units 08/09/2021   11:57 AM  PFT Results  FVC-Pre L 2.43   FVC-Predicted Pre % 82   FVC-Post L 2.43   FVC-Predicted Post % 83   Pre FEV1/FVC % % 63   Post FEV1/FCV % % 71   FEV1-Pre L 1.52   FEV1-Predicted Pre % 69   FEV1-Post L 1.72   DLCO uncorrected ml/min/mmHg 15.02   DLCO UNC% % 77   DLCO corrected ml/min/mmHg 13.99   DLCO COR %Predicted % 72   DLVA Predicted % 84   TLC L 4.73   TLC % Predicted % 93   RV % Predicted % 105    Mild obstruction with BD response, mildly reduced diffusing capacity     Assessment & Plan:   # Bronchiectasis with acute exacerbation: # Recent LRTI: Etiology of her bronchiectasis is unclear - whether caused by or predisposing to remote NTM or other atypical infection (though cultures to date including from BAL have  been negative), connective tissue disease but screening tests negative, aspiration  given lower lobe predominance (at least recurrent macroaspiration seems unlikely by history), immune deficiency but testing for CVID neg.   # Obstructive lung disease Unclear etiology unless caused by her bronchiectasis (burden of which however is mild), alternatively consider longstanding asthma but history really not suggestive of this. Regardless she doesn't sound impressively symptomatic and hasn't had much response to trial of LABA/ICS so far.   # Chronic rhinosinusitis # Postnasal drainage   Plan: - Sputum sample today - Chest x ray today - Prescription for doxycycline 100 mg twice daily for 2 weeks. If need to extend it for another week just call or clinic or send me a my chart message  - symbicort 2 puffs twice daily, rinse mouth after use - flutter valve 10 slow but firm puffs twice daily after each symbicort treatment - take shower, clear nose of crusting and then flonase 1 spray each nostril - mucinex dm twice daily till you're done with antibiotics      Maryjane Hurter, MD [facility] 05/10/2022 9:49 AM

## 2022-05-11 ENCOUNTER — Ambulatory Visit (INDEPENDENT_AMBULATORY_CARE_PROVIDER_SITE_OTHER): Payer: Medicare Other | Admitting: Student

## 2022-05-11 ENCOUNTER — Encounter: Payer: Self-pay | Admitting: Student

## 2022-05-11 ENCOUNTER — Ambulatory Visit (INDEPENDENT_AMBULATORY_CARE_PROVIDER_SITE_OTHER): Payer: Medicare Other

## 2022-05-11 VITALS — BP 130/62 | HR 74 | Temp 99.0°F | Ht 65.0 in | Wt 194.0 lb

## 2022-05-11 DIAGNOSIS — J471 Bronchiectasis with (acute) exacerbation: Secondary | ICD-10-CM

## 2022-05-11 DIAGNOSIS — U071 COVID-19: Secondary | ICD-10-CM

## 2022-05-11 DIAGNOSIS — J189 Pneumonia, unspecified organism: Secondary | ICD-10-CM

## 2022-05-11 MED ORDER — DOXYCYCLINE HYCLATE 100 MG PO TABS: 100.0000 mg | ORAL_TABLET | Freq: Two times a day (BID) | ORAL | 0 refills | Status: DC

## 2022-05-11 NOTE — Patient Instructions (Signed)
-   sputum cultures, x ray  - doxycycline 100 mg twice daily for 2-3 weeks - look up cefdinir, cefpodoxime to see what kind of reaction you've had to this whether positive or negative  - Prescription for doxycycline 100 mg twice daily for 2 weeks. If need to extend it for another week just call or clinic or send me a my chart message  - symbicort 2 puffs twice daily, rinse mouth after use - flutter valve 10 slow but firm puffs twice daily after each symbicort treatment - take shower, clear nose of crusting and then flonase 1 spray each nostril - mucinex dm twice daily till you're done with antibiotics

## 2022-05-12 ENCOUNTER — Other Ambulatory Visit: Payer: Medicare Other

## 2022-05-12 DIAGNOSIS — J471 Bronchiectasis with (acute) exacerbation: Secondary | ICD-10-CM | POA: Diagnosis not present

## 2022-05-12 NOTE — Telephone Encounter (Signed)
Dr. Verlee Monte, please see mychart message sent by pt and advise.

## 2022-05-13 MED ORDER — BENZONATATE 100 MG PO CAPS: 100.0000 mg | ORAL_CAPSULE | Freq: Three times a day (TID) | ORAL | 0 refills | Status: DC | PRN

## 2022-05-13 NOTE — Telephone Encounter (Signed)
Let's just hold off on adding another antibiotic unless you're failing to improve on doxy. [facility] [place]

## 2022-05-14 LAB — RESPIRATORY CULTURE OR RESPIRATORY AND SPUTUM CULTURE: MICRO NUMBER:: 14073711

## 2022-05-18 ENCOUNTER — Telehealth: Payer: Self-pay | Admitting: Student

## 2022-05-18 ENCOUNTER — Other Ambulatory Visit: Payer: Self-pay | Admitting: Student

## 2022-05-18 MED ORDER — DOXYCYCLINE HYCLATE 100 MG PO TABS: 100.0000 mg | ORAL_TABLET | Freq: Two times a day (BID) | ORAL | 0 refills | Status: DC

## 2022-05-18 NOTE — Telephone Encounter (Signed)
Pt called the office stating that she had tried requesting a refill of doxy but it was denied. Last OV, Dr. Verlee Monte stated for pt to be on doxy for at least two weeks. Refill has been sent. Nothing further needed.

## 2022-06-06 ENCOUNTER — Other Ambulatory Visit: Payer: Self-pay | Admitting: Family Medicine

## 2022-06-06 DIAGNOSIS — E039 Hypothyroidism, unspecified: Secondary | ICD-10-CM

## 2022-06-07 DIAGNOSIS — Z23 Encounter for immunization: Secondary | ICD-10-CM | POA: Diagnosis not present

## 2022-07-04 NOTE — Progress Notes (Unsigned)
Synopsis: Referred for bronchiectasis by Midge Minium, MD  Subjective:   PATIENT ID: Karen Jones: female DOB: [birth year], MRN: [id number]  No chief complaint on file.  71yF with history of allergy, R t-tube placement 2017 who is referred for bronchiectasis.  She says she went to [place] 04/11/21 on a bus trip and several folks developed sinus issues/cough. She says no one had covid but no one, including her, was tested for covid. She didn't start feeling bad until 10/2. Got to [facility] and seemed to really worsen after [place] dinner/show, remembers it being quite cold that night. Tried various symptomatic measures without improvement. A great deal of chest congestion, sinus congestion, very productive cough. Maybe low grade fever, no night sweats, unintentional weight loss.   Husband after they got back to [place] had similar symptoms, was prescribed doxy with improvement in his symptoms.   She was eventually seen by physician with telehealth visit through [facility] 10/14 who prescribed prednisone, z pack, tessalon perles, sudafed, albuterol. Re-upped prednisone at a visit a week later with FNP. Eventually she was also prescribed doxycycline for 10d course which dramatically improved symptoms. Subsequently developed pleurtic CP so CT Chest obtained 11/21 showing bronchiectasis, mucus plugging, atelectasis. Pleuritic CP persists. She has no DOE.   She has occasional issues with coughing while she's eating/drinking.   Mother died of emphysema  She has had some secondhand smoke exposure growing up.  Interval HPI:  Given 3 week course of doxy after last visit, sputum culture no growth. Did feel substantially improved finally at end of course, back to baseline.  She says that she was doing better last week. [place] around [place]. Friday morning got up with a lot of sinus congestion, postnasal drainage. She has tried going back on  symbicort, flutter valve, mucinex and even started course of doxycycline with an old prescription.   -------------------- Prolonged course of doxy last visit   Sputum culture rejected. Not sure what happened to AFB.  We were planning to consider role of azithromycin to prevent exacerbations  Otherwise pertinent review of systems is negative.      Past Medical History:  Diagnosis Date   Allergy    Anemia    Benign positional vertigo    Cancer (HCC)    skin   Colon polyps    Hypothyroidism    Lung nodule 08/29/2021   OAB (overactive bladder)    Pain in limb    Pneumonia    Thyroid disease    Vaccine counseling 08/29/2021     Family History  Problem Relation Age of Onset   Emphysema Mother    Colon cancer Father 35   Cancer Father    Heart disease Father    Heart attack Father    Colon polyps Brother    Breast cancer Neg Hx    Esophageal cancer Neg Hx    Rectal cancer Neg Hx    Stomach cancer Neg Hx      Past Surgical History:  Procedure Laterality Date   ABDOMINAL HYSTERECTOMY     BILATERAL OOPHORECTOMY  1995   BRONCHIAL WASHINGS  08/17/2021   Procedure: BRONCHIAL WASHINGS;  Surgeon: Maryjane Hurter, MD;  Location: [facility] ENDOSCOPY;  Service: Pulmonary;;   COLONOSCOPY  1995,1997,2001,2005,2010   polyps removed every colonoscopy except 1 time,per pt   HAND SURGERY     to remove webbing between fingers    HEMOSTASIS CONTROL  08/17/2021  Procedure: HEMOSTASIS CONTROL;  Surgeon: Maryjane Hurter, MD;  Location: [facility] ENDOSCOPY;  Service: Pulmonary;;   POLYPECTOMY     TONSILLECTOMY     TUBAL LIGATION     UMBILICAL HERNIA REPAIR     VIDEO BRONCHOSCOPY N/A 08/17/2021   Procedure: VIDEO BRONCHOSCOPY WITHOUT FLUORO;  Surgeon: Maryjane Hurter, MD;  Location: [facility] ENDOSCOPY;  Service: Pulmonary;  Laterality: N/A;    Social History   Socioeconomic History   Marital status: Married    Spouse name: Not on file   Number of children: Not on file   Years of education:  Not on file   Highest education level: Not on file  Occupational History   Occupation: Retired  Tobacco Use   Smoking status: Never   Smokeless tobacco: Never  Vaping Use   Vaping Use: Never used  Substance and Sexual Activity   Alcohol use: Yes    Alcohol/week: 5.0 standard drinks of alcohol    Types: 3 Glasses of wine, 2 Shots of liquor per week   Drug use: No   Sexual activity: Not Currently  Other Topics Concern   Not on file  Social History Narrative   Not on file   Social Determinants of Health   Financial Resource Strain: Low Risk  (08/19/2021)   Overall Financial Resource Strain (CARDIA)    Difficulty of Paying Living Expenses: Not hard at all  Food Insecurity: No Food Insecurity (08/19/2021)   Hunger Vital Sign    Worried About Running Out of Food in the Last Year: Never true    Ran Out of Food in the Last Year: Never true  Transportation Needs: No Transportation Needs (08/19/2021)   PRAPARE - Hydrologist (Medical): No    Lack of Transportation (Non-Medical): No  Physical Activity: Insufficiently Active (08/19/2021)   Exercise Vital Sign    Days of Exercise per Week: 3 days    Minutes of Exercise per Jones: 40 min  Stress: No Stress Concern Present (08/19/2021)   [place]    Feeling of Stress : Not at all  Social Connections: [place] (08/19/2021)   Social Connection and Isolation Panel [NHANES]    Frequency of Communication with Friends and Family: Twice a week    Frequency of Social Gatherings with Friends and Family: Twice a week    Attends Religious Services: More than 4 times per year    Active Member of [employer] or Organizations: Yes    Attends Music therapist: More than 4 times per year    Marital Status: Married  Human resources officer Violence: Not At Risk (08/19/2021)   Humiliation, Afraid, Rape, and Kick questionnaire    Fear of Current or  Ex-Partner: No    Emotionally Abused: No    Physically Abused: No    Sexually Abused: No     Allergies  Allergen Reactions   Penicillins Shortness Of Breath and Rash    rash, difficulty breathing   Levofloxacin     Myalgias (intolerance)     Outpatient Medications Prior to Visit  Medication Sig Dispense Refill   benzonatate (TESSALON) 100 MG capsule Take 1 capsule (100 mg total) by mouth 3 (three) times daily as needed for cough. 20 capsule 0   budesonide-formoterol (SYMBICORT) 80-4.5 MCG/ACT inhaler Inhale 2 puffs into the lungs in the morning and at bedtime. 1 each 12   cholecalciferol (VITAMIN D3) 25 MCG (1000 UNIT) tablet Take 1,000 Units  by mouth daily.     doxycycline (VIBRA-TABS) 100 MG tablet Take 1 tablet (100 mg total) by mouth 2 (two) times daily. 14 tablet 0   furosemide (LASIX) 20 MG tablet Take 1 tablet (20 mg total) by mouth daily as needed. 30 tablet 3   levothyroxine (SYNTHROID) 75 MCG tablet TAKE 1 TABLET BY MOUTH DAILY WITH BREAKFAST. 90 tablet 0   meclizine (ANTIVERT) 25 MG tablet Take 1 tablet (25 mg total) by mouth 3 (three) times daily as needed for dizziness. 30 tablet 0   tretinoin (RETIN-A) 0.05 % cream Apply 1 application topically at bedtime.     No facility-administered medications prior to visit.       Objective:   Physical Exam:  General appearance: 71 y.o., female, NAD, conversant  Eyes: anicteric sclerae; PERRL, tracking appropriately HENT: NCAT; MMM, maxillary sinus tenderness Neck: Trachea midline; no lymphadenopathy, no JVD Lungs: rhonchi, with normal respiratory effort CV: RRR, no murmur  Abdomen: Soft, non-tender; non-distended, BS present  Extremities: No peripheral edema, warm Skin: Normal turgor and texture; no rash Psych: Appropriate affect Neuro: Alert and oriented to person and place, no focal deficit     There were no vitals filed for this visit.        on RA BMI Readings from Last 3 Encounters:  05/11/22 32.28  kg/m  03/23/22 31.95 kg/m  03/11/22 33.13 kg/m   Wt Readings from Last 3 Encounters:  05/11/22 194 lb (88 kg)  03/23/22 192 lb (87.1 kg)  03/11/22 193 lb (87.5 kg)     CBC    Component Value Date/Time   WBC 4.5 08/17/2021 0618   RBC 4.20 08/17/2021 0618   HGB 12.6 08/17/2021 0618   HCT 40.1 08/17/2021 0618   PLT 222 08/17/2021 0618   MCV 95.5 08/17/2021 0618   MCH 30.0 08/17/2021 0618   MCHC 31.4 08/17/2021 0618   RDW 13.6 08/17/2021 0618   LYMPHSABS 1.2 06/09/2021 1041   MONOABS 0.4 06/09/2021 1041   EOSABS 0.1 06/09/2021 1041   BASOSABS 0.1 06/09/2021 1041    Low level eosinophilia A1AT phenotype PI*MM, normal level IgE 3 Ig levels WNL RF negative Endemic fungal serologies negative  BAL AFB, fungal cx pending  Chest Imaging: HRCT Chest 06/14/21 reviewed by me remarkable for lower lobe/RML/lingula predominant bronchiectasis with mucus plugging and atelectasis,scar  CXR 03/23/22 reviewed by me mild extent of tram tracking lower lobes   CXR 05/11/22 reviewed by me with RLL infiltrate vs atelectasis  Pulmonary Functions Testing Results:    Latest Ref Rng & Units 08/09/2021   11:57 AM  PFT Results  FVC-Pre L 2.43   FVC-Predicted Pre % 82   FVC-Post L 2.43   FVC-Predicted Post % 83   Pre FEV1/FVC % % 63   Post FEV1/FCV % % 71   FEV1-Pre L 1.52   FEV1-Predicted Pre % 69   FEV1-Post L 1.72   DLCO uncorrected ml/min/mmHg 15.02   DLCO UNC% % 77   DLCO corrected ml/min/mmHg 13.99   DLCO COR %Predicted % 72   DLVA Predicted % 84   TLC L 4.73   TLC % Predicted % 93   RV % Predicted % 105    Mild obstruction with BD response, mildly reduced diffusing capacity     Assessment & Plan:   # Bronchiectasis with acute exacerbation: # RLL CAP: Etiology of her bronchiectasis is unclear - whether caused by or predisposing to remote NTM or other atypical infection (though cultures to date  including from BAL have been negative), connective tissue disease but  screening tests negative, aspiration given lower lobe predominance (at least recurrent macroaspiration seems unlikely by history), immune deficiency but testing for CVID neg.   # Obstructive lung disease Unclear etiology unless caused by her bronchiectasis (burden of which however is mild), alternatively consider longstanding asthma but history really not suggestive of this. Regardless she doesn't sound impressively symptomatic and hasn't had much response to trial of LABA/ICS so far.   # Chronic rhinosinusitis # Postnasal drainage   Plan: - sputum cultures - doxycycline 100 mg twice daily for 2-3 weeks - look up cefdinir, cefpodoxime to see what kind of reaction you've had to this whether positive or negative - would add one of these to treat CAP - Prescription for doxycycline 100 mg twice daily for 2 weeks. If need to extend it for another week just call or clinic or send me a my chart message  - symbicort 2 puffs twice daily, rinse mouth after use - flutter valve 10 slow but firm puffs twice daily after each symbicort treatment - take shower, clear nose of crusting and then flonase 1 spray each nostril - mucinex dm twice daily till you're done with antibiotics - consideration of azithromycin for recurrent bronchiectasis exacerbations next visit   RTC 8 weeks   Maryjane Hurter, MD [facility] 07/04/2022 6:11 PM

## 2022-07-06 ENCOUNTER — Encounter: Payer: Self-pay | Admitting: Student

## 2022-07-06 ENCOUNTER — Ambulatory Visit (INDEPENDENT_AMBULATORY_CARE_PROVIDER_SITE_OTHER): Payer: Medicare Other | Admitting: Student

## 2022-07-06 VITALS — BP 118/68 | HR 60 | Temp 97.7°F | Ht 65.0 in | Wt 191.8 lb

## 2022-07-06 DIAGNOSIS — J479 Bronchiectasis, uncomplicated: Secondary | ICD-10-CM | POA: Diagnosis not present

## 2022-07-06 MED ORDER — BUDESONIDE-FORMOTEROL FUMARATE 80-4.5 MCG/ACT IN AERO: 2.0000 | INHALATION_SPRAY | Freq: Two times a day (BID) | RESPIRATORY_TRACT | 12 refills | Status: AC

## 2022-07-06 NOTE — Patient Instructions (Addendum)
We will set up follow up with PFT in 3 months  If active cough, call us and resume following: - symbicort 2 puffs twice daily as needed, rinse mouth after use - flutter valve 10 slow but firm puffs twice daily after each symbicort treatment - take shower, clear nose of crusting and then flonase 1 spray each nostril - mucinex dm twice daily till you're done with antibiotics

## 2022-07-15 LAB — AFB CULTURE WITH SMEAR (NOT AT ARMC)
Acid Fast Culture: NEGATIVE
Acid Fast Smear: NEGATIVE

## 2022-09-01 ENCOUNTER — Ambulatory Visit: Payer: Medicare Other

## 2022-09-04 ENCOUNTER — Other Ambulatory Visit: Payer: Self-pay | Admitting: Family Medicine

## 2022-09-04 DIAGNOSIS — E039 Hypothyroidism, unspecified: Secondary | ICD-10-CM

## 2022-09-13 ENCOUNTER — Ambulatory Visit (INDEPENDENT_AMBULATORY_CARE_PROVIDER_SITE_OTHER): Payer: Medicare Other

## 2022-09-13 VITALS — Ht 65.0 in | Wt 188.0 lb

## 2022-09-13 DIAGNOSIS — Z0000 Encounter for general adult medical examination without abnormal findings: Secondary | ICD-10-CM | POA: Diagnosis not present

## 2022-09-13 NOTE — Progress Notes (Signed)
Subjective:   Karen Jones is a 72 y.o. female who presents for Medicare Annual (Subsequent) preventive examination.  Review of Systems    Virtual Visit via Telephone Note  I connected with  [name] on 09/13/22 at  1:00 PM EST by telephone and verified that I am speaking with the correct person using two identifiers.  Location: Patient: Home Provider: Office Persons participating in the virtual visit: patient/Nurse Health Advisor   I discussed the limitations, risks, security and privacy concerns of performing an evaluation and management service by telephone and the availability of in person appointments. The patient expressed understanding and agreed to proceed.  Interactive audio and video telecommunications were attempted between this nurse and patient, however failed, due to patient having technical difficulties OR patient did not have access to video capability.  We continued and completed visit with audio only.  Some vital signs may be absent or patient reported.   Criselda Peaches, LPN  Cardiac Risk Factors include: advanced age (>32mn, >>110women)     Objective:    Today's Vitals   09/13/22 1305  Weight: 188 lb (85.3 kg)  Height: 5' 5"$  (1.651 m)   Body mass index is 31.28 kg/m.     09/13/2022    1:15 PM 08/19/2021    1:37 PM 08/17/2021    6:23 AM 06/08/2020   10:27 AM 06/03/2019    2:01 PM 05/23/2018    2:01 PM 05/18/2017    1:33 PM  Advanced Directives  Does Patient Have a Medical Advance Directive? Yes Yes No Yes Yes Yes Yes  Type of AParamed[place]of [place] will [place] will  Living will [place] will Living will Living will  Does patient want to make changes to medical advance directive? No - Patient declined        Copy of [place] Chart? Yes - validated most recent copy scanned in chart (See row information) No - copy requested    Yes - validated most recent copy scanned in chart (See row information)    Would patient like information on creating a medical advance directive?   No - Patient declined        Current Medications (verified) Outpatient Encounter Medications as of 09/13/2022  Medication Sig   budesonide-formoterol (SYMBICORT) 80-4.5 MCG/ACT inhaler Inhale 2 puffs into the lungs in the morning and at bedtime.   benzonatate (TESSALON) 100 MG capsule Take 1 capsule (100 mg total) by mouth 3 (three) times daily as needed for cough. (Patient not taking: Reported on 07/06/2022)   cholecalciferol (VITAMIN D3) 25 MCG (1000 UNIT) tablet Take 1,000 Units by mouth daily.   doxycycline (VIBRA-TABS) 100 MG tablet Take 1 tablet (100 mg total) by mouth 2 (two) times daily. (Patient taking differently: Take 50 mg by mouth 2 (two) times daily. Per dermatologist.)   furosemide (LASIX) 20 MG tablet Take 1 tablet (20 mg total) by mouth daily as needed.   levothyroxine (SYNTHROID) 75 MCG tablet TAKE 1 TABLET BY MOUTH EVERY DAY WITH BREAKFAST   meclizine (ANTIVERT) 25 MG tablet Take 1 tablet (25 mg total) by mouth 3 (three) times daily as needed for dizziness.   tretinoin (RETIN-A) 0.05 % cream Apply 1 application topically at bedtime.   No facility-administered encounter medications on file as of 09/13/2022.    Allergies (verified) Penicillins and Levofloxacin   History: Past Medical History:  Diagnosis Date   Allergy  Anemia    Benign positional vertigo    Cancer (HCC)    skin   Colon polyps    Hypothyroidism    Lung nodule 08/29/2021   OAB (overactive bladder)    Pain in limb    Pneumonia    Thyroid disease    Vaccine counseling 08/29/2021   Past Surgical History:  Procedure Laterality Date   ABDOMINAL HYSTERECTOMY     BILATERAL OOPHORECTOMY  1995   BRONCHIAL WASHINGS  08/17/2021   Procedure: BRONCHIAL WASHINGS;  Surgeon: Maryjane Hurter, MD;  Location: [facility] ENDOSCOPY;  Service: Pulmonary;;   COLONOSCOPY   1995,1997,2001,2005,2010   polyps removed every colonoscopy except 1 time,per pt   HAND SURGERY     to remove webbing between fingers    HEMOSTASIS CONTROL  08/17/2021   Procedure: HEMOSTASIS CONTROL;  Surgeon: Maryjane Hurter, MD;  Location: [facility] ENDOSCOPY;  Service: Pulmonary;;   POLYPECTOMY     TONSILLECTOMY     TUBAL LIGATION     UMBILICAL HERNIA REPAIR     VIDEO BRONCHOSCOPY N/A 08/17/2021   Procedure: VIDEO BRONCHOSCOPY WITHOUT FLUORO;  Surgeon: Maryjane Hurter, MD;  Location: [facility] ENDOSCOPY;  Service: Pulmonary;  Laterality: N/A;   Family History  Problem Relation Age of Onset   Emphysema Mother    Colon cancer Father 79   Cancer Father    Heart disease Father    Heart attack Father    Colon polyps Brother    Breast cancer Neg Hx    Esophageal cancer Neg Hx    Rectal cancer Neg Hx    Stomach cancer Neg Hx    Social History   Socioeconomic History   Marital status: Married    Spouse name: Not on file   Number of children: Not on file   Years of education: Not on file   Highest education level: Not on file  Occupational History   Occupation: Retired  Tobacco Use   Smoking status: Never   Smokeless tobacco: Never  Vaping Use   Vaping Use: Never used  Substance and Sexual Activity   Alcohol use: Yes    Alcohol/week: 5.0 standard drinks of alcohol    Types: 3 Glasses of wine, 2 Shots of liquor per week   Drug use: No   Sexual activity: Not Currently  Other Topics Concern   Not on file  Social History Narrative   Not on file   Social Determinants of Health   Financial Resource Strain: Low Risk  (09/13/2022)   Overall Financial Resource Strain (CARDIA)    Difficulty of Paying Living Expenses: Not hard at all  Food Insecurity: No Food Insecurity (09/13/2022)   Hunger Vital Sign    Worried About Running Out of Food in the Last Year: Never true    Ran Out of Food in the Last Year: Never true  Transportation Needs: No Transportation Needs (09/13/2022)   PRAPARE  - Hydrologist (Medical): No    Lack of Transportation (Non-Medical): No  Physical Activity: Insufficiently Active (09/13/2022)   Exercise Vital Sign    Days of Exercise per Week: 7 days    Minutes of Exercise per Session: 20 min  Stress: No Stress Concern Present (09/13/2022)   [place]    Feeling of Stress : Not at all  Social Connections: [place] (09/13/2022)   Social Connection and Isolation Panel [NHANES]    Frequency of Communication with Friends and  Family: More than three times a week    Frequency of Social Gatherings with Friends and Family: More than three times a week    Attends Religious Services: More than 4 times per year    Active Member of Clubs or Organizations: Yes    Attends Music therapist: More than 4 times per year    Marital Status: Married    Tobacco Counseling Counseling given: Not Answered   Clinical Intake:  Pre-visit preparation completed: Yes  Pain : No/denies pain     BMI - recorded: 31.28 Nutritional Status: BMI > 30  Obese Nutritional Risks: None Diabetes: No  How often do you need to have someone help you when you read instructions, pamphlets, or other written materials from your doctor or pharmacy?: 1 - Never  Diabetic?  No  Interpreter Needed?: No  Information entered by :: Rolene Arbour LPN   Activities of Daily Living    09/13/2022    1:13 PM  In your present state of health, do you have any difficulty performing the following activities:  Hearing? 1  Comment Wears hearing aids  Vision? 0  Difficulty concentrating or making decisions? 0  Walking or climbing stairs? 0  Dressing or bathing? 0  Doing errands, shopping? 0  Preparing Food and eating ? N  Using the Toilet? N  In the past six months, have you accidently leaked urine? N  Do you have problems with loss of bowel control? N  Managing your  Medications? N  Managing your Finances? N  Housekeeping or managing your Housekeeping? N    Patient Care Team: Midge Minium, MD as PCP - General (Family Medicine) Loletha Carrow Kirke Corin, MD as Consulting Physician (Gastroenterology) Linus Mako, MD as Consulting Physician (Family Medicine) Lubertha Sayres, MD as Referring Physician (Dermatology) Ortho, [facility] (Specialist) Madelin Rear, [facility] (Inactive) as Pharmacist (Pharmacist)  Indicate any recent Medical Services you may have received from other than [facility] providers in the past year (date may be approximate).     Assessment:   This is a routine wellness examination for Khaliya.  Hearing/Vision screen Hearing Screening - Comments:: Wears hearing aids Vision Screening - Comments:: Wears rx glasses - up to date with routine eye exams with  [place] issues and exercise activities discussed: Exercise limited by: None identified   Goals Addressed               This Visit's Progress     Weight (lb) < 180 lb (81.6 kg) (pt-stated)   188 lb (85.3 kg)     Lose weight.        Depression Screen    09/13/2022    1:11 PM 03/11/2022   10:38 AM 11/22/2021    9:07 AM 08/19/2021    1:43 PM 08/19/2021    1:35 PM 06/16/2021    9:01 AM 06/09/2021    9:53 AM  PHQ 2/9 Scores  PHQ - 2 Score 0 0 0 0 0 0 0  PHQ- 9 Score  1 0    0    Fall Risk    09/13/2022    1:14 PM 03/11/2022   10:38 AM 11/22/2021    9:07 AM 08/19/2021    1:38 PM 08/18/2021    8:53 PM  Fall Risk   Falls in the past year? 0 0 0 0 0  Number falls in past yr: 0 0 0 0 0  Injury with Fall? 0 0 0 0 0  Risk  for fall due to : No Fall Risks No Fall Risks No Fall Risks No Fall Risks   Follow up Falls prevention discussed Falls evaluation completed Falls evaluation completed Falls evaluation completed     [place]:  Any stairs in or around the home? Yes  If so, are there any without handrails? No  Home free of  loose throw rugs in walkways, pet beds, electrical cords, etc? Yes  Adequate lighting in your home to reduce risk of falls? Yes   ASSISTIVE DEVICES UTILIZED TO PREVENT FALLS:  Life alert? No  Use of a cane, walker or w/c? No  Grab bars in the bathroom? No  Shower chair or bench in shower? Yes  Elevated toilet seat or a handicapped toilet? No   TIMED UP AND GO:  Was the test performed? No . Audio Visit   Cognitive Function:    06/03/2019    2:19 PM 05/23/2018    2:02 PM  MMSE - Mini Mental State Exam  Orientation to time 5 5  Orientation to Place 5 5  Registration 3 3  Attention/ Calculation 5 5  Recall 2 2  Language- name 2 objects 2 2  Language- repeat 1 1  Language- follow 3 step command 3 3  Language- read & follow direction 1 1  Write a sentence 1 1  Copy design 1 1  Total score 29 29        09/13/2022    1:15 PM  6CIT Screen  What Year? 0 points  What month? 0 points  What time? 0 points  Count back from 20 0 points  Months in reverse 0 points  Repeat phrase 0 points  Total Score 0 points    Immunizations Immunization History  Administered Date(s) Administered   Hepatitis A 03/09/2011, 09/21/2011   Hepatitis B 01/05/2011, 03/09/2011, 03/20/2012   Influenza Split 06/07/2022   Influenza, High Dose Seasonal PF 05/23/2018, 04/18/2019   Influenza,inj,Quad PF,6+ Mos 04/21/2016, 04/26/2017   Influenza-Unspecified 04/10/2014, 04/14/2015, 04/09/2020, 06/03/2021   PFIZER Comirnaty(Gray Top)Covid-19 Tri-Sucrose Vaccine 04/25/2022   PFIZER(Purple Top)SARS-COV-2 Vaccination 08/29/2019, 09/23/2019, 05/04/2020, 04/25/2022   PNEUMOCOCCAL CONJUGATE-20 08/30/2021   Pfizer Covid-19 Vaccine Bivalent Booster 24yr & up 03/02/2021   Pneumococcal Conjugate-13 03/09/2015   Pneumococcal Polysaccharide-23 05/26/2016   Respiratory Syncytial Virus Vaccine,Recomb Aduvanted(Arexvy) 05/26/2022   Tdap 11/22/2004, 03/27/2015   Zoster Recombinat (Shingrix) 02/03/2018, 05/07/2018    Zoster, Live 06/24/2010    TDAP status: Up to date  Flu Vaccine status: Up to date  Pneumococcal vaccine status: Up to date  Covid-19 vaccine status: Completed vaccines  Qualifies for Shingles Vaccine? Yes   Zostavax completed Yes   Shingrix Completed?: Yes  Screening Tests Health Maintenance  Topic Date Due   COVID-19 Vaccine (6 - 2023-24 season) 09/29/2022 (Originally 06/20/2022)   MAMMOGRAM  02/15/2023   Medicare Annual Wellness ([place]  09/14/2023   COLONOSCOPY (Pts 45-464yrInsurance coverage will need to be confirmed)  10/12/2024   DTaP/Tdap/Td (3 - Td or Tdap) 03/26/2025   Pneumonia Vaccine 72Years old  Completed   INFLUENZA VACCINE  Completed   DEXA SCAN  Completed   Hepatitis C Screening  Completed   Zoster Vaccines- Shingrix  Completed   HPV VACCINES  Aged Out    Health Maintenance  There are no preventive care reminders to display for this patient.   Colorectal cancer screening: Type of screening: Colonoscopy. Completed 10/12/21. Repeat every 3 years  Mammogram status: Completed 02/14/22. Repeat every  year  Bone Density status: Completed 07/30/20. Results reflect: Bone density results: OSTEOPOROSIS. Repeat every   years.  Lung Cancer Screening: (Low Dose CT Chest recommended if Age 9-80 years, 30 pack-year currently smoking OR have quit w/in 15years.) does not qualify.     Additional Screening:  Hepatitis C Screening: does qualify; Completed 05/25/18  Vision Screening: Recommended annual ophthalmology exams for early detection of glaucoma and other disorders of the eye. Is the patient up to date with their annual eye exam?  Yes  Who is the provider or what is the name of the office in which the patient attends annual eye exams? [facility] If pt is not established with a provider, would they like to be referred to a provider to establish care? No .   Dental Screening: Recommended annual dental exams for proper oral hygiene  Community Resource  Referral / Chronic Care Management:  CRR required this visit?  No   CCM required this visit?  No      Plan:     I have personally reviewed and noted the following in the patient's chart:   Medical and social history Use of alcohol, tobacco or illicit drugs  Current medications and supplements including opioid prescriptions. Patient is not currently taking opioid prescriptions. Functional ability and status Nutritional status Physical activity Advanced directives List of other physicians Hospitalizations, surgeries, and ER visits in previous 12 months Vitals Screenings to include cognitive, depression, and falls Referrals and appointments  In addition, I have reviewed and discussed with patient certain preventive protocols, quality metrics, and best practice recommendations. A written personalized care plan for preventive services as well as general preventive health recommendations were provided to patient.     Criselda Peaches, LPN   [id number]   Nurse Notes: None

## 2022-09-13 NOTE — Patient Instructions (Addendum)
Karen Jones , Thank you for taking time to come for your Medicare Wellness Visit. I appreciate your ongoing commitment to your health goals. Please review the following plan we discussed and let me know if I can assist you in the future.   These are the goals we discussed:  Goals       Weight (lb) < 180 lb (81.6 kg) (pt-stated)      Lose weight.         This is a list of the screening recommended for you and due dates:  Health Maintenance  Topic Date Due   COVID-19 Vaccine (6 - 2023-24 season) 09/29/2022*   Mammogram  02/15/2023   Medicare Annual Wellness Visit  09/14/2023   Colon Cancer Screening  10/12/2024   DTaP/Tdap/Td vaccine (3 - Td or Tdap) 03/26/2025   Pneumonia Vaccine  Completed   Flu Shot  Completed   DEXA scan (bone density measurement)  Completed   Hepatitis C Screening: USPSTF Recommendation to screen - Ages 42-79 yo.  Completed   Zoster (Shingles) Vaccine  Completed   HPV Vaccine  Aged Out  *Topic was postponed. The date shown is not the original due date.    Advanced directives: In Chart  Conditions/risks identified: None  Next appointment: Follow up in one year for your annual wellness visit     Preventive Care 65 Years and Older, Female Preventive care refers to lifestyle choices and visits with your health care provider that can promote health and wellness. What does preventive care include? A yearly physical exam. This is also called an annual well check. Dental exams once or twice a year. Routine eye exams. Ask your health care provider how often you should have your eyes checked. Personal lifestyle choices, including: Daily care of your teeth and gums. Regular physical activity. Eating a healthy diet. Avoiding tobacco and drug use. Limiting alcohol use. Practicing safe sex. Taking low-dose aspirin every day. Taking vitamin and mineral supplements as recommended by your health care provider. What happens during an annual well check? The  services and screenings done by your health care provider during your annual well check will depend on your age, overall health, lifestyle risk factors, and family history of disease. Counseling  Your health care provider may ask you questions about your: Alcohol use. Tobacco use. Drug use. Emotional well-being. Home and relationship well-being. Sexual activity. Eating habits. History of falls. Memory and ability to understand (cognition). Work and work Statistician. Reproductive health. Screening  You may have the following tests or measurements: Height, weight, and BMI. Blood pressure. Lipid and cholesterol levels. These may be checked every 5 years, or more frequently if you are over 62 years old. Skin check. Lung cancer screening. You may have this screening every year starting at age 28 if you have a 30-pack-year history of smoking and currently smoke or have quit within the past 15 years. Fecal occult blood test (FOBT) of the stool. You may have this test every year starting at age 24. Flexible sigmoidoscopy or colonoscopy. You may have a sigmoidoscopy every 5 years or a colonoscopy every 10 years starting at age 78. Hepatitis C blood test. Hepatitis B blood test. Sexually transmitted disease (STD) testing. Diabetes screening. This is done by checking your blood sugar (glucose) after you have not eaten for a while (fasting). You may have this done every 1-3 years. Bone density scan. This is done to screen for osteoporosis. You may have this done starting at age 37. Mammogram. This  may be done every 1-2 years. Talk to your health care provider about how often you should have regular mammograms. Talk with your health care provider about your test results, treatment options, and if necessary, the need for more tests. Vaccines  Your health care provider may recommend certain vaccines, such as: Influenza vaccine. This is recommended every year. Tetanus, diphtheria, and acellular  pertussis (Tdap, Td) vaccine. You may need a Td booster every 10 years. Zoster vaccine. You may need this after age 25. Pneumococcal 13-valent conjugate (PCV13) vaccine. One dose is recommended after age 43. Pneumococcal polysaccharide (PPSV23) vaccine. One dose is recommended after age 29. Talk to your health care provider about which screenings and vaccines you need and how often you need them. This information is not intended to replace advice given to you by your health care provider. Make sure you discuss any questions you have with your health care provider. Document Released: 08/07/2015 Document Revised: 03/30/2016 Document Reviewed: 05/12/2015 Elsevier Interactive Patient Education  2017 [place] Prevention in the Home Falls can cause injuries. They can happen to people of all ages. There are many things you can do to make your home safe and to help prevent falls. What can I do on the outside of my home? Regularly fix the edges of walkways and driveways and fix any cracks. Remove anything that might make you trip as you walk through a door, such as a raised step or threshold. Trim any bushes or trees on the path to your home. Use bright outdoor lighting. Clear any walking paths of anything that might make someone trip, such as rocks or tools. Regularly check to see if handrails are loose or broken. Make sure that both sides of any steps have handrails. Any raised decks and porches should have guardrails on the edges. Have any leaves, snow, or ice cleared regularly. Use sand or salt on walking paths during winter. Clean up any spills in your garage right away. This includes oil or grease spills. What can I do in the bathroom? Use night lights. Install grab bars by the toilet and in the tub and shower. Do not use towel bars as grab bars. Use non-skid mats or decals in the tub or shower. If you need to sit down in the shower, use a plastic, non-slip stool. Keep the floor  dry. Clean up any water that spills on the floor as soon as it happens. Remove soap buildup in the tub or shower regularly. Attach bath mats securely with double-sided non-slip rug tape. Do not have throw rugs and other things on the floor that can make you trip. What can I do in the bedroom? Use night lights. Make sure that you have a light by your bed that is easy to reach. Do not use any sheets or blankets that are too big for your bed. They should not hang down onto the floor. Have a firm chair that has side arms. You can use this for support while you get dressed. Do not have throw rugs and other things on the floor that can make you trip. What can I do in the kitchen? Clean up any spills right away. Avoid walking on wet floors. Keep items that you use a lot in easy-to-reach places. If you need to reach something above you, use a strong step stool that has a grab bar. Keep electrical cords out of the way. Do not use floor polish or wax that makes floors slippery. If you must  use wax, use non-skid floor wax. Do not have throw rugs and other things on the floor that can make you trip. What can I do with my stairs? Do not leave any items on the stairs. Make sure that there are handrails on both sides of the stairs and use them. Fix handrails that are broken or loose. Make sure that handrails are as long as the stairways. Check any carpeting to make sure that it is firmly attached to the stairs. Fix any carpet that is loose or worn. Avoid having throw rugs at the top or bottom of the stairs. If you do have throw rugs, attach them to the floor with carpet tape. Make sure that you have a light switch at the top of the stairs and the bottom of the stairs. If you do not have them, ask someone to add them for you. What else can I do to help prevent falls? Wear shoes that: Do not have high heels. Have rubber bottoms. Are comfortable and fit you well. Are closed at the toe. Do not wear  sandals. If you use a stepladder: Make sure that it is fully opened. Do not climb a closed stepladder. Make sure that both sides of the stepladder are locked into place. Ask someone to hold it for you, if possible. Clearly mark and make sure that you can see: Any grab bars or handrails. First and last steps. Where the edge of each step is. Use tools that help you move around (mobility aids) if they are needed. These include: Canes. Walkers. Scooters. Crutches. Turn on the lights when you go into a dark area. Replace any light bulbs as soon as they burn out. Set up your furniture so you have a clear path. Avoid moving your furniture around. If any of your floors are uneven, fix them. If there are any pets around you, be aware of where they are. Review your medicines with your doctor. Some medicines can make you feel dizzy. This can increase your chance of falling. Ask your doctor what other things that you can do to help prevent falls. This information is not intended to replace advice given to you by your health care provider. Make sure you discuss any questions you have with your health care provider. Document Released: 05/07/2009 Document Revised: 12/17/2015 Document Reviewed: 08/15/2014 Elsevier Interactive Patient Education  2017 [employer].

## 2022-09-14 ENCOUNTER — Encounter: Payer: Self-pay | Admitting: Family Medicine

## 2022-09-14 ENCOUNTER — Ambulatory Visit (INDEPENDENT_AMBULATORY_CARE_PROVIDER_SITE_OTHER): Payer: Medicare Other | Admitting: Family Medicine

## 2022-09-14 VITALS — BP 130/76 | HR 68 | Temp 99.0°F | Resp 16 | Wt 194.0 lb

## 2022-09-14 DIAGNOSIS — J479 Bronchiectasis, uncomplicated: Secondary | ICD-10-CM | POA: Diagnosis not present

## 2022-09-14 DIAGNOSIS — E039 Hypothyroidism, unspecified: Secondary | ICD-10-CM

## 2022-09-14 DIAGNOSIS — E669 Obesity, unspecified: Secondary | ICD-10-CM

## 2022-09-14 LAB — CBC WITH DIFFERENTIAL/PLATELET
Basophils Absolute: 0.1 10*3/uL (ref 0.0–0.1)
Basophils Relative: 0.9 % (ref 0.0–3.0)
Eosinophils Absolute: 0.1 10*3/uL (ref 0.0–0.7)
Eosinophils Relative: 1.1 % (ref 0.0–5.0)
HCT: 40.8 % (ref 36.0–46.0)
Hemoglobin: 13.7 g/dL (ref 12.0–15.0)
Lymphocytes Relative: 26.8 % (ref 12.0–46.0)
Lymphs Abs: 1.5 10*3/uL (ref 0.7–4.0)
MCHC: 33.5 g/dL (ref 30.0–36.0)
MCV: 92.1 fl (ref 78.0–100.0)
Monocytes Absolute: 0.6 10*3/uL (ref 0.1–1.0)
Monocytes Relative: 10 % (ref 3.0–12.0)
Neutro Abs: 3.5 10*3/uL (ref 1.4–7.7)
Neutrophils Relative %: 61.2 % (ref 43.0–77.0)
Platelets: 238 10*3/uL (ref 150.0–400.0)
RBC: 4.43 Mil/uL (ref 3.87–5.11)
RDW: 14.1 % (ref 11.5–15.5)
WBC: 5.7 10*3/uL (ref 4.0–10.5)

## 2022-09-14 LAB — TSH: TSH: 1.13 u[IU]/mL (ref 0.35–5.50)

## 2022-09-14 NOTE — Assessment & Plan Note (Signed)
Ongoing issue for pt.  Weight is stable and BMI 32.28  Encouraged healthy diet and regular physical activity.  Check labs to risk stratify.  Will follow.

## 2022-09-14 NOTE — Assessment & Plan Note (Signed)
Chronic problem.  Currently asymptomatic on Levothyroxine 82mg daily.  Check labs.  Adjust meds prn

## 2022-09-14 NOTE — Progress Notes (Signed)
   Subjective:    Patient ID: Karen Jones, female    DOB: [birth year], 72 y.o.   MRN: [id number]  HPI Hypothyroid- chronic problem, on Levothyroxine 72mg daily.  Pt reports energy level is good.  Denies changes to skin/hair/nails.  Bronchiectasis- pt reports she is feeling much better since Derm started her on Doxycycline daily for rosacea.  Pt feels Netti pot has been very helpful.  Obesity- pt's weight is stable since August.  BMI 32.28.  Pt used pedaler for 45 minutes/day x3 months and 'didn't lose a pound'.  Got discouraged and stopped doing anything regarding weight loss.  Has recently started a chair yoga program.   Review of Systems For ROS see HPI     Objective:   Physical Exam Vitals reviewed.  Constitutional:      General: She is not in acute distress.    Appearance: Normal appearance. She is well-developed. She is obese. She is not ill-appearing.  HENT:     Head: Normocephalic and atraumatic.  Eyes:     Conjunctiva/sclera: Conjunctivae normal.     Pupils: Pupils are equal, round, and reactive to light.  Neck:     Thyroid: No thyromegaly.  Cardiovascular:     Rate and Rhythm: Normal rate and regular rhythm.     Pulses: Normal pulses.     Heart sounds: Normal heart sounds. No murmur heard. Pulmonary:     Effort: Pulmonary effort is normal. No respiratory distress.     Breath sounds: Normal breath sounds.  Abdominal:     General: There is no distension.     Palpations: Abdomen is soft.     Tenderness: There is no abdominal tenderness.  Musculoskeletal:     Cervical back: Normal range of motion and neck supple.     Right lower leg: No edema.     Left lower leg: No edema.  Lymphadenopathy:     Cervical: No cervical adenopathy.  Skin:    General: Skin is warm and dry.  Neurological:     General: No focal deficit present.     Mental Status: She is alert and oriented to person, place, and time.  Psychiatric:        Mood and Affect: Mood normal.         Behavior: Behavior normal.        Thought Content: Thought content normal.           Assessment & Plan:

## 2022-09-14 NOTE — Assessment & Plan Note (Signed)
Chronic problem.  Following w/ Pulmonary.  Reports feeling much better since starting Doxy daily for rosacea.  Using Netti pot daily w/ good results.  Currently asymptomatic.  Wlil follow along and assist as needed

## 2022-09-14 NOTE — Patient Instructions (Addendum)
Follow up in 6 months to recheck weight loss progress We'll notify you of your lab results and make any changes if needed Continue to work on low carb diet and regular physical activity Get your treadmill back!!! Call with any questions or concerns Stay Safe!  Stay Healthy! Happy Spring!!

## 2022-09-15 ENCOUNTER — Telehealth: Payer: Self-pay

## 2022-09-15 LAB — BASIC METABOLIC PANEL
BUN: 19 mg/dL (ref 6–23)
CO2: 26 mEq/L (ref 19–32)
Calcium: 9.8 mg/dL (ref 8.4–10.5)
Chloride: 107 mEq/L (ref 96–112)
Creatinine, Ser: 0.92 mg/dL (ref 0.40–1.20)
GFR: 61.73 mL/min (ref >60.00)
Glucose, Bld: 84 mg/dL (ref 70–99)
Potassium: 4.9 mEq/L (ref 3.5–5.1)
Sodium: 144 mEq/L (ref 135–145)

## 2022-09-15 LAB — HEPATIC FUNCTION PANEL
ALT: 9 U/L (ref 0–35)
AST: 18 U/L (ref 0–37)
Albumin: 4.1 g/dL (ref 3.5–5.2)
Alkaline Phosphatase: 44 U/L (ref 39–117)
Bilirubin, Direct: 0.1 mg/dL (ref 0.0–0.3)
Total Bilirubin: 0.5 mg/dL (ref 0.2–1.2)
Total Protein: 6.6 g/dL (ref 6.0–8.3)

## 2022-09-15 LAB — LIPID PANEL
Cholesterol: 178 mg/dL (ref 0–200)
HDL: 64 mg/dL (ref >39.00)
LDL Cholesterol: 100 mg/dL — ABNORMAL HIGH (ref 0–99)
NonHDL: 114.35
Total CHOL/HDL Ratio: 3
Triglycerides: 71 mg/dL (ref 0.0–149.0)
VLDL: 14.2 mg/dL (ref 0.0–40.0)

## 2022-09-15 NOTE — Telephone Encounter (Signed)
-----   Message from Midge Minium, MD sent at 09/15/2022 12:24 PM EST ----- Labs look GREAT!!!  No changes at this time

## 2022-09-15 NOTE — Telephone Encounter (Signed)
Informed pt of lab results  

## 2022-10-04 DIAGNOSIS — D225 Melanocytic nevi of trunk: Secondary | ICD-10-CM | POA: Diagnosis not present

## 2022-10-04 DIAGNOSIS — Z1283 Encounter for screening for malignant neoplasm of skin: Secondary | ICD-10-CM | POA: Diagnosis not present

## 2022-10-04 DIAGNOSIS — B078 Other viral warts: Secondary | ICD-10-CM | POA: Diagnosis not present

## 2022-10-04 DIAGNOSIS — L820 Inflamed seborrheic keratosis: Secondary | ICD-10-CM | POA: Diagnosis not present

## 2022-11-15 DIAGNOSIS — B078 Other viral warts: Secondary | ICD-10-CM | POA: Diagnosis not present

## 2022-11-15 DIAGNOSIS — L820 Inflamed seborrheic keratosis: Secondary | ICD-10-CM | POA: Diagnosis not present

## 2022-11-27 NOTE — Progress Notes (Unsigned)
Synopsis: Referred for bronchiectasis by Sheliah Hatch, MD  Subjective:   PATIENT ID: Karen Jones: female DOB: June 21, 1951, MRN: [id number]  No chief complaint on file.  70yF with history of allergy, R t-tube placement 2017 who is referred for bronchiectasis.  She says she went to [place] 04/11[place]21 on a bus trip and several folks developed sinus issues/cough. She says no one had covid but no one, including her, was tested for covid. She didn't start feeling bad until 10/2. Got to [facility] and seemed to really worsen after [place] dinner/show, remembers it being quite cold that night. Tried various symptomatic measures without improvement. A great deal of chest congestion, sinus congestion, very productive cough. Maybe low grade fever, no night sweats, unintentional weight loss.   Husband after they got back to [place] had similar symptoms, was prescribed doxy with improvement in his symptoms.   She was eventually seen by physician with telehealth visit through [facility] 10/14 who prescribed prednisone, z pack, tessalon perles, sudafed, albuterol. Re-upped prednisone at a visit a week later with FNP. Eventually she was also prescribed doxycycline for 10d course which dramatically improved symptoms. Subsequently developed pleurtic CP so CT Chest obtained 11/21 showing bronchiectasis, mucus plugging, atelectasis. Pleuritic CP persists. She has no DOE.   She has occasional issues with coughing while she's eating/drinking.   Mother died of emphysema  She has had some secondhand smoke exposure growing up.  Interval HPI:   Prolonged course of doxy last visit   Sputum culture rejected. Not sure what happened to AFB.  We were planning to consider role of azithromycin to prevent exacerbations  Doing well however currently.   Actually has been off of the symbicort since the last course of ABX.  --------------------------------- PFTs  No new imaging   Otherwise pertinent review of  systems is negative.      Past Medical History:  Diagnosis Date   Allergy    Anemia    Benign positional vertigo    Cancer (HCC)    skin   Colon polyps    Hypothyroidism    Lung nodule 08/29/2021   OAB (overactive bladder)    Pain in limb    Pneumonia    Thyroid disease    Vaccine counseling 08/29/2021     Family History  Problem Relation Age of Onset   Emphysema Mother    Colon cancer Father 59   Cancer Father    Heart disease Father    Heart attack Father    Colon polyps Brother    Breast cancer Neg Hx    Esophageal cancer Neg Hx    Rectal cancer Neg Hx    Stomach cancer Neg Hx      Past Surgical History:  Procedure Laterality Date   ABDOMINAL HYSTERECTOMY     BILATERAL OOPHORECTOMY  1995   BRONCHIAL WASHINGS  08/17/2021   Procedure: BRONCHIAL WASHINGS;  Surgeon: Omar Person, MD;  Location: [facility] ENDOSCOPY;  Service: Pulmonary;;   COLONOSCOPY  1995,1997,2001,2005,2010   polyps removed every colonoscopy except 1 time,per pt   HAND SURGERY     to remove webbing between fingers    HEMOSTASIS CONTROL  08/17/2021   Procedure: HEMOSTASIS CONTROL;  Surgeon: Omar Person, MD;  Location: [facility] ENDOSCOPY;  Service: Pulmonary;;   POLYPECTOMY     TONSILLECTOMY     TUBAL LIGATION     UMBILICAL HERNIA REPAIR     VIDEO BRONCHOSCOPY N/A 08/17/2021   Procedure: VIDEO BRONCHOSCOPY WITHOUT FLUORO;  Surgeon: Omar Person, MD;  Location: [facility] ENDOSCOPY;  Service: Pulmonary;  Laterality: N/A;    Social History   Socioeconomic History   Marital status: Married    Spouse name: Not on file   Number of children: Not on file   Years of education: Not on file   Highest education level: Not on file  Occupational History   Occupation: Retired  Tobacco Use   Smoking status: Never   Smokeless tobacco: Never  Vaping Use   Vaping Use: Never used  Substance and Sexual Activity   Alcohol use: Yes    Alcohol/week: 5.0 standard drinks of alcohol    Types: 3 Glasses of wine,  2 Shots of liquor per week   Drug use: No   Sexual activity: Not Currently  Other Topics Concern   Not on file  Social History Narrative   Not on file   Social Determinants of Health   Financial Resource Strain: Low Risk  (09/13/2022)   Overall Financial Resource Strain (CARDIA)    Difficulty of Paying Living Expenses: Not hard at all  Food Insecurity: No Food Insecurity (09/13/2022)   Hunger Vital Sign    Worried About Running Out of Food in the Last Year: Never true    Ran Out of Food in the Last Year: Never true  Transportation Needs: No Transportation Needs (09/13/2022)   PRAPARE - Administrator, Civil Service (Medical): No    Lack of Transportation (Non-Medical): No  Physical Activity: Insufficiently Active (09/13/2022)   Exercise Vital Sign    Days of Exercise per Week: 7 days    Minutes of Exercise per Session: 20 min  Stress: No Stress Concern Present (09/13/2022)   [employer] of Occupational Health - Occupational Stress Questionnaire    Feeling of Stress : Not at all  Social Connections: Socially Integrated (09/13/2022)   Social Connection and Isolation Panel [NHANES]    Frequency of Communication with Friends and Family: More than three times a week    Frequency of Social Gatherings with Friends and Family: More than three times a week    Attends Religious Services: More than 4 times per year    Active Member of [employer] or Organizations: Yes    Attends Engineer, structural: More than 4 times per year    Marital Status: Married  Catering manager Violence: Not At Risk (09/13/2022)   Humiliation, Afraid, Rape, and Kick questionnaire    Fear of Current or Ex-Partner: No    Emotionally Abused: No    Physically Abused: No    Sexually Abused: No     Allergies  Allergen Reactions   Penicillins Shortness Of Breath and Rash    rash, difficulty breathing   Levofloxacin     Myalgias (intolerance)     Outpatient Medications Prior to Visit   Medication Sig Dispense Refill   budesonide-formoterol (SYMBICORT) 80-4.5 MCG/ACT inhaler Inhale 2 puffs into the lungs in the morning and at bedtime. 1 each 12   cholecalciferol (VITAMIN D3) 25 MCG (1000 UNIT) tablet Take 1,000 Units by mouth daily.     doxycycline (VIBRA-TABS) 100 MG tablet Take 1 tablet (100 mg total) by mouth 2 (two) times daily. (Patient taking differently: Take 50 mg by mouth 2 (two) times daily. Per dermatologist.) 14 tablet 0   furosemide (LASIX) 20 MG tablet Take 1 tablet (20 mg total) by mouth daily as needed. 30 tablet 3   levothyroxine (SYNTHROID) 75 MCG tablet TAKE 1 TABLET BY  MOUTH EVERY DAY WITH BREAKFAST 90 tablet 0   meclizine (ANTIVERT) 25 MG tablet Take 1 tablet (25 mg total) by mouth 3 (three) times daily as needed for dizziness. 30 tablet 0   tretinoin (RETIN-A) 0.05 % cream Apply 1 application topically at bedtime.     No facility-administered medications prior to visit.       Objective:   Physical Exam:  General appearance: 72 y.o., female, NAD, conversant  Eyes: anicteric sclerae; PERRL, tracking appropriately HENT: NCAT; MMM, maxillary sinus tenderness Neck: Trachea midline; no lymphadenopathy, no JVD Lungs: ctab, with normal respiratory effort CV: RRR, no murmur  Abdomen: Soft, non-tender; non-distended, BS present  Extremities: No peripheral edema, warm Skin: Normal turgor and texture; no rash Psych: Appropriate affect Neuro: Alert and oriented to person and place, no focal deficit     There were no vitals filed for this visit.         on RA BMI Readings from Last 3 Encounters:  09/14/22 32.28 kg/m  09/13/22 31.28 kg/m  07/06/22 31.92 kg/m   Wt Readings from Last 3 Encounters:  09/14/22 194 lb (88 kg)  09/13/22 188 lb (85.3 kg)  07/06/22 191 lb 12.8 oz (87 kg)     CBC    Component Value Date/Time   WBC 5.7 09/14/2022 1036   RBC 4.43 09/14/2022 1036   HGB 13.7 09/14/2022 1036   HCT 40.8 09/14/2022 1036   PLT  238.0 09/14/2022 1036   MCV 92.1 09/14/2022 1036   MCH 30.0 08/17/2021 0618   MCHC 33.5 09/14/2022 1036   RDW 14.1 09/14/2022 1036   LYMPHSABS 1.5 09/14/2022 1036   MONOABS 0.6 09/14/2022 1036   EOSABS 0.1 09/14/2022 1036   BASOSABS 0.1 09/14/2022 1036    Low level eosinophilia A1AT phenotype PI*MM, normal level IgE 3 Ig levels WNL RF negative Endemic fungal serologies negative  BAL AFB, fungal cx pending  Chest Imaging: HRCT Chest 06/14/21 reviewed by me remarkable for lower lobe/RML/lingula predominant bronchiectasis with mucus plugging and atelectasis,scar  CXR 03/23/22 reviewed by me mild extent of tram tracking lower lobes   CXR 05/11/22 reviewed by me with RLL infiltrate vs atelectasis  Pulmonary Functions Testing Results:    Latest Ref Rng & Units 08/09/2021   11:57 AM  PFT Results  FVC-Pre L 2.43   FVC-Predicted Pre % 82   FVC-Post L 2.43   FVC-Predicted Post % 83   Pre FEV1/FVC % % 63   Post FEV1/FCV % % 71   FEV1-Pre L 1.52   FEV1-Predicted Pre % 69   FEV1-Post L 1.72   DLCO uncorrected ml/min/mmHg 15.02   DLCO UNC% % 77   DLCO corrected ml/min/mmHg 13.99   DLCO COR %Predicted % 72   DLVA Predicted % 84   TLC L 4.73   TLC % Predicted % 93   RV % Predicted % 105    Mild obstruction with BD response, mildly reduced diffusing capacity     Assessment & Plan:   # Bronchiectasis: Etiology of her bronchiectasis is unclear - whether caused by or predisposing to remote NTM or other atypical infection (though cultures to date including from BAL have been negative), connective tissue disease but screening tests negative, aspiration given lower lobe predominance (at least recurrent macroaspiration seems unlikely by history), immune deficiency but testing for CVID neg.   # Obstructive lung disease Unclear etiology unless caused by her bronchiectasis (burden of which however is mild), alternatively consider longstanding asthma but history really not suggestive  of  this. Regardless she doesn't sound impressively symptomatic and hasn't had much response to trial of LABA/ICS so far.   # Chronic rhinosinusitis # Postnasal drainage   Plan: We will set up follow up with PFT in 3 months  If active cough, call us and resume following: - symbicort 2 puffs twice daily as needed, rinse mouth after use - flutter valve 10 slow but firm puffs twice daily after each symbicort treatment - take shower, clear nose of crusting and then flonase 1 spray each nostril - mucinex dm twice daily till you're done with antibiotics   RTC 3 months with PFT   Omar Person, MD [facility] 11/27/2022 8:45 AM

## 2022-11-28 ENCOUNTER — Ambulatory Visit (INDEPENDENT_AMBULATORY_CARE_PROVIDER_SITE_OTHER): Payer: Medicare Other | Admitting: Student

## 2022-11-28 ENCOUNTER — Encounter: Payer: Self-pay | Admitting: Student

## 2022-11-28 VITALS — BP 126/70 | HR 85 | Temp 98.2°F | Ht 65.0 in | Wt 193.0 lb

## 2022-11-28 DIAGNOSIS — R918 Other nonspecific abnormal finding of lung field: Secondary | ICD-10-CM | POA: Diagnosis not present

## 2022-11-28 DIAGNOSIS — J479 Bronchiectasis, uncomplicated: Secondary | ICD-10-CM

## 2022-11-28 LAB — PULMONARY FUNCTION TEST
FEF 25-75 Post: 1.14 L/sec
FEF 25-75 Pre: 1.16 L/sec
FEF2575-%Change-Post: -1 %
FEF2575-%Pred-Post: 62 %
FEF2575-%Pred-Pre: 63 %
FEV1-%Change-Post: 3 %
FEV1-%Pred-Post: 71 %
FEV1-%Pred-Pre: 69 %
FEV1-Post: 1.63 L
FEV1-Pre: 1.58 L
FEV1FVC-%Change-Post: 5 %
FEV1FVC-%Pred-Pre: 92 %
FEV6-%Change-Post: 2 %
FEV6-%Pred-Post: 76 %
FEV6-%Pred-Pre: 75 %
FEV6-Post: 2.21 L
FEV6-Pre: 2.16 L
FEV6FVC-%Pred-Post: 104 %
FEV6FVC-%Pred-Pre: 104 %
FVC-%Change-Post: -2 %
FVC-%Pred-Post: 73 %
FVC-%Pred-Pre: 75 %
FVC-Post: 2.21 L
FVC-Pre: 2.26 L
Post FEV1/FVC ratio: 74 %
Post FEV6/FVC ratio: 100 %
Pre FEV1/FVC ratio: 70 %
Pre FEV6/FVC Ratio: 100 %

## 2022-11-28 NOTE — Progress Notes (Signed)
Pre and Post Spiro performed today. 

## 2022-11-28 NOTE — Patient Instructions (Signed)
Pre and Post Spiro performed today. 

## 2022-11-28 NOTE — Patient Instructions (Addendum)
-   You'll be called to schedule ct chest, call or send  my chart message if you have any questions about it, otherwise will discuss at follow up visit in in 4-6 weeks   If active cough, call us and resume following: - symbicort 2 puffs twice daily as needed, rinse mouth after use - flutter valve 10 slow but firm puffs twice daily after each symbicort treatment - take shower, clear nose of crusting and then flonase 1 spray each nostril - mucinex dm twice daily till you're done with antibiotics

## 2022-12-04 ENCOUNTER — Other Ambulatory Visit: Payer: Self-pay | Admitting: Family Medicine

## 2022-12-04 DIAGNOSIS — E039 Hypothyroidism, unspecified: Secondary | ICD-10-CM

## 2022-12-05 ENCOUNTER — Ambulatory Visit (HOSPITAL_BASED_OUTPATIENT_CLINIC_OR_DEPARTMENT_OTHER)
Admission: RE | Admit: 2022-12-05 | Discharge: 2022-12-05 | Disposition: A | Discharge status: Home / Self Care | Payer: Medicare Other | Source: Ambulatory Visit | Attending: Student | Admitting: Student

## 2022-12-05 DIAGNOSIS — J479 Bronchiectasis, uncomplicated: Secondary | ICD-10-CM

## 2022-12-05 DIAGNOSIS — R918 Other nonspecific abnormal finding of lung field: Secondary | ICD-10-CM | POA: Insufficient documentation

## 2023-01-11 ENCOUNTER — Other Ambulatory Visit: Payer: Self-pay | Admitting: Family Medicine

## 2023-01-11 DIAGNOSIS — Z1231 Encounter for screening mammogram for malignant neoplasm of breast: Secondary | ICD-10-CM

## 2023-01-15 NOTE — Progress Notes (Signed)
Synopsis: Referred for bronchiectasis by Sheliah Hatch, MD  Subjective:   PATIENT ID: Karen Jones: female DOB: [birth year], MRN: [id number]  Chief Complaint  Patient presents with   Follow-up    Pt states she has been doing well.    72yF with history of allergy, R t-tube placement 2017 who is referred for bronchiectasis.  She says she went to [place] 04/11/21 on a bus trip and several folks developed sinus issues/cough. She says no one had covid but no one, including her, was tested for covid. She didn't start feeling bad until 10/2. Got to [facility] and seemed to really worsen after [place] dinner/show, remembers it being quite cold that night. Tried various symptomatic measures without improvement. A great deal of chest congestion, sinus congestion, very productive cough. Maybe low grade fever, no night sweats, unintentional weight loss.   Husband after they got back to [place] had similar symptoms, was prescribed doxy with improvement in his symptoms.   She was eventually seen by physician with telehealth visit through [facility] 10/14 who prescribed prednisone, z pack, tessalon perles, sudafed, albuterol. Re-upped prednisone at a visit a week later with FNP. Eventually she was also prescribed doxycycline for 10d course which dramatically improved symptoms. Subsequently developed pleurtic CP so CT Chest obtained 11/21 showing bronchiectasis, mucus plugging, atelectasis. Pleuritic CP persists. She has no DOE.   She has occasional issues with coughing while she's eating/drinking.   Mother died of emphysema  She has had some secondhand smoke exposure growing up.  Interval HPI:  Micah Flesher to [employer] [employer] on a road trip. During long walk in [facility] did try symbicort with some improvement in chest tightness which only occurred at high altitude and possibly with environmental exposure.   No trouble since she's been back. No bothersome cough.    Otherwise pertinent review of systems  is negative.      Past Medical History:  Diagnosis Date   Allergy    Anemia    Benign positional vertigo    Cancer (HCC)    skin   Colon polyps    Hypothyroidism    Lung nodule 08/29/2021   OAB (overactive bladder)    Pain in limb    Pneumonia    Thyroid disease    Vaccine counseling 08/29/2021     Family History  Problem Relation Age of Onset   Emphysema Mother    Colon cancer Father 54   Cancer Father    Heart disease Father    Heart attack Father    Colon polyps Brother    Breast cancer Neg Hx    Esophageal cancer Neg Hx    Rectal cancer Neg Hx    Stomach cancer Neg Hx      Past Surgical History:  Procedure Laterality Date   ABDOMINAL HYSTERECTOMY     BILATERAL OOPHORECTOMY  1995   BRONCHIAL WASHINGS  08/17/2021   Procedure: BRONCHIAL WASHINGS;  Surgeon: Omar Person, MD;  Location: [facility] ENDOSCOPY;  Service: Pulmonary;;   COLONOSCOPY  1995,1997,2001,2005,2010   polyps removed every colonoscopy except 1 time,per pt   HAND SURGERY     to remove webbing between fingers    HEMOSTASIS CONTROL  08/17/2021   Procedure: HEMOSTASIS CONTROL;  Surgeon: Omar Person, MD;  Location: [facility] ENDOSCOPY;  Service: Pulmonary;;   POLYPECTOMY     TONSILLECTOMY     TUBAL LIGATION     UMBILICAL HERNIA REPAIR     VIDEO BRONCHOSCOPY N/A 08/17/2021   Procedure:  VIDEO BRONCHOSCOPY WITHOUT FLUORO;  Surgeon: Omar Person, MD;  Location: [facility] ENDOSCOPY;  Service: Pulmonary;  Laterality: N/A;    Social History   Socioeconomic History   Marital status: Married    Spouse name: Not on file   Number of children: Not on file   Years of education: Not on file   Highest education level: Not on file  Occupational History   Occupation: Retired  Tobacco Use   Smoking status: Never   Smokeless tobacco: Never  Vaping Use   Vaping Use: Never used  Substance and Sexual Activity   Alcohol use: Yes    Alcohol/week: 5.0 standard drinks of alcohol    Types: 3 Glasses of wine, 2 Shots  of liquor per week   Drug use: No   Sexual activity: Not Currently  Other Topics Concern   Not on file  Social History Narrative   Not on file   Social Determinants of Health   Financial Resource Strain: Low Risk  (09/13/2022)   Overall Financial Resource Strain (CARDIA)    Difficulty of Paying Living Expenses: Not hard at all  Food Insecurity: No Food Insecurity (09/13/2022)   Hunger Vital Sign    Worried About Running Out of Food in the Last Year: Never true    Ran Out of Food in the Last Year: Never true  Transportation Needs: No Transportation Needs (09/13/2022)   PRAPARE - Administrator, Civil Service (Medical): No    Lack of Transportation (Non-Medical): No  Physical Activity: Insufficiently Active (09/13/2022)   Exercise Vital Sign    Days of Exercise per Week: 7 days    Minutes of Exercise per Session: 20 min  Stress: No Stress Concern Present (09/13/2022)   [employer] of Occupational Health - Occupational Stress Questionnaire    Feeling of Stress : Not at all  Social Connections: Socially Integrated (09/13/2022)   Social Connection and Isolation Panel [NHANES]    Frequency of Communication with Friends and Family: More than three times a week    Frequency of Social Gatherings with Friends and Family: More than three times a week    Attends Religious Services: More than 4 times per year    Active Member of [employer] or Organizations: Yes    Attends Engineer, structural: More than 4 times per year    Marital Status: Married  Catering manager Violence: Not At Risk (09/13/2022)   Humiliation, Afraid, Rape, and Kick questionnaire    Fear of Current or Ex-Partner: No    Emotionally Abused: No    Physically Abused: No    Sexually Abused: No     Allergies  Allergen Reactions   Penicillins Shortness Of Breath and Rash    rash, difficulty breathing   Levofloxacin     Myalgias (intolerance)     Outpatient Medications Prior to Visit  Medication Sig  Dispense Refill   budesonide-formoterol (SYMBICORT) 80-4.5 MCG/ACT inhaler Inhale 2 puffs into the lungs in the morning and at bedtime. 1 each 12   cholecalciferol (VITAMIN D3) 25 MCG (1000 UNIT) tablet Take 1,000 Units by mouth daily.     doxycycline (ADOXA) 50 MG tablet Take 50 mg by mouth 2 (two) times daily.     furosemide (LASIX) 20 MG tablet Take 1 tablet (20 mg total) by mouth daily as needed. 30 tablet 3   levothyroxine (SYNTHROID) 75 MCG tablet TAKE 1 TABLET BY MOUTH EVERY DAY WITH BREAKFAST 90 tablet 0   meclizine (ANTIVERT) 25  MG tablet Take 1 tablet (25 mg total) by mouth 3 (three) times daily as needed for dizziness. 30 tablet 0   tretinoin (RETIN-A) 0.05 % cream Apply 1 application topically at bedtime.     No facility-administered medications prior to visit.       Objective:   Physical Exam:  General appearance: 72 y.o., female, NAD, conversant  Eyes: anicteric sclerae; PERRL, tracking appropriately HENT: NCAT; MMM, maxillary sinus tenderness Neck: Trachea midline; no lymphadenopathy, no JVD Lungs: ctab, with normal respiratory effort CV: RRR, no murmur  Abdomen: Soft, non-tender; non-distended, BS present  Extremities: No peripheral edema, warm Skin: Normal turgor and texture; no rash Psych: Appropriate affect Neuro: Alert and oriented to person and place, no focal deficit     Vitals:   01/17/23 0938  BP: 126/78  Pulse: 68  Temp: 98.4 F (36.9 C)  TempSrc: Oral  SpO2: 97%  Weight: 195 lb 3.2 oz (88.5 kg)  Height: 5\' 5"  (1.651 m)          97% on RA BMI Readings from Last 3 Encounters:  01/17/23 32.48 kg/m  11/28/22 32.12 kg/m  09/14/22 32.28 kg/m   Wt Readings from Last 3 Encounters:  01/17/23 195 lb 3.2 oz (88.5 kg)  11/28/22 193 lb (87.5 kg)  09/14/22 194 lb (88 kg)     CBC    Component Value Date/Time   WBC 5.7 09/14/2022 1036   RBC 4.43 09/14/2022 1036   HGB 13.7 09/14/2022 1036   HCT 40.8 09/14/2022 1036   PLT 238.0  09/14/2022 1036   MCV 92.1 09/14/2022 1036   MCH 30.0 08/17/2021 0618   MCHC 33.5 09/14/2022 1036   RDW 14.1 09/14/2022 1036   LYMPHSABS 1.5 09/14/2022 1036   MONOABS 0.6 09/14/2022 1036   EOSABS 0.1 09/14/2022 1036   BASOSABS 0.1 09/14/2022 1036    Low level eosinophilia A1AT phenotype PI*MM, normal level IgE 3 Ig levels WNL RF negative Endemic fungal serologies negative  BAL AFB, fungal cx pending  Chest Imaging: HRCT Chest 06/14/21 reviewed by me remarkable for lower lobe/RML/lingula predominant bronchiectasis with mucus plugging and atelectasis,scar  CXR 03/23/22 reviewed by me mild extent of tram tracking lower lobes   CXR 05/11/22 reviewed by me with RLL infiltrate vs atelectasis  CT Chest 12/05/22 reviewed by me with interval resolution lower lobe consolidation, a couple residual foci of scar/atelectasis   Pulmonary Functions Testing Results:    Latest Ref Rng & Units 11/28/2022    9:45 AM 08/09/2021   11:57 AM  PFT Results  FVC-Pre L 2.26  2.43   FVC-Predicted Pre % 75  82   FVC-Post L 2.21  2.43   FVC-Predicted Post % 73  83   Pre FEV1/FVC % % 70  63   Post FEV1/FCV % % 74  71   FEV1-Pre L 1.58  1.52   FEV1-Predicted Pre % 69  69   FEV1-Post L 1.63  1.72   DLCO uncorrected ml/min/mmHg  15.02   DLCO UNC% %  77   DLCO corrected ml/min/mmHg  13.99   DLCO COR %Predicted %  72   DLVA Predicted %  84   TLC L  4.73   TLC % Predicted %  93   RV % Predicted %  105    Mild obstruction with BD response, mildly reduced diffusing capacity     Assessment & Plan:   # Bronchiectasis: Etiology of her bronchiectasis is unclear - whether caused by or predisposing to remote  NTM or other atypical infection (though cultures to date including from BAL have been negative), connective tissue disease but screening tests negative, aspiration given lower lobe predominance (at least recurrent macroaspiration seems unlikely by history), immune deficiency but testing for CVID neg.  Regardless doing well symptomatically at present without recent exacerbation.   # Nodular consolidation bilateral lower lobes, resolved Likely chronic atelectasis/mucus plugging, low risk for lung CA   # Obstructive lung disease Unclear etiology unless caused by her bronchiectasis (burden of which however is mild), alternatively consider longstanding asthma but history really not suggestive of this. Regardless she doesn't sound impressively symptomatic and hasn't had much response to trial of LABA/ICS in past.   # Chronic rhinosinusitis # Postnasal drainage   Plan:  If active cough, call us and resume following: - symbicort 2 puffs twice daily as needed, rinse mouth after use - flutter valve 10 slow but firm puffs twice daily after each symbicort treatment - take shower, clear nose of crusting and then flonase 1 spray each nostril - mucinex dm twice daily till you're done with antibiotics  RTC as needed  Omar Person, MD [facility] 01/17/2023 9:51 AM

## 2023-01-17 ENCOUNTER — Ambulatory Visit (INDEPENDENT_AMBULATORY_CARE_PROVIDER_SITE_OTHER): Payer: Medicare Other | Admitting: Student

## 2023-01-17 ENCOUNTER — Encounter: Payer: Self-pay | Admitting: Student

## 2023-01-17 VITALS — BP 126/78 | HR 68 | Temp 98.4°F | Ht 65.0 in | Wt 195.2 lb

## 2023-01-17 DIAGNOSIS — R918 Other nonspecific abnormal finding of lung field: Secondary | ICD-10-CM | POA: Diagnosis not present

## 2023-01-17 DIAGNOSIS — J479 Bronchiectasis, uncomplicated: Secondary | ICD-10-CM | POA: Diagnosis not present

## 2023-01-17 NOTE — Patient Instructions (Addendum)
If active cough, call us and resume following: - symbicort 2 puffs twice daily as needed, rinse mouth after use - flutter valve 10 slow but firm puffs twice daily after each symbicort treatment - take shower, clear nose of crusting and then flonase 1 spray each nostril - mucinex dm twice daily

## 2023-02-16 ENCOUNTER — Ambulatory Visit
Admission: RE | Admit: 2023-02-16 | Discharge: 2023-02-16 | Disposition: A | Discharge status: Home / Self Care | Payer: Medicare Other | Source: Ambulatory Visit | Attending: Family Medicine | Admitting: Family Medicine

## 2023-02-16 DIAGNOSIS — Z1231 Encounter for screening mammogram for malignant neoplasm of breast: Secondary | ICD-10-CM | POA: Diagnosis not present

## 2023-03-04 ENCOUNTER — Other Ambulatory Visit: Payer: Self-pay | Admitting: Family Medicine

## 2023-03-04 DIAGNOSIS — E039 Hypothyroidism, unspecified: Secondary | ICD-10-CM

## 2023-04-20 DIAGNOSIS — Z23 Encounter for immunization: Secondary | ICD-10-CM | POA: Diagnosis not present

## 2023-05-15 DIAGNOSIS — Z23 Encounter for immunization: Secondary | ICD-10-CM | POA: Diagnosis not present

## 2023-06-08 ENCOUNTER — Other Ambulatory Visit: Payer: Self-pay | Admitting: Family Medicine

## 2023-06-08 DIAGNOSIS — E039 Hypothyroidism, unspecified: Secondary | ICD-10-CM

## 2023-08-15 ENCOUNTER — Telehealth: Payer: Self-pay | Admitting: Family Medicine

## 2023-08-15 NOTE — Telephone Encounter (Signed)
Type of form received:[facility]   Additional comments: Prescriber Services- Levothyroxine Recall Info   Received ZO:[id number]- Front Desk   Form should be Faxed/mailed to: N/A  Is patient requesting call for pickup:N/A  Form placed: [employer] charge sheet.  Provider will determine charge. N/A  Individual made aware of 3-5 business day turn around No?

## 2023-08-15 NOTE — Telephone Encounter (Signed)
Does not need a phone note, no provider signature required.

## 2023-09-05 ENCOUNTER — Ambulatory Visit: Payer: Medicare Other | Admitting: Family Medicine

## 2023-09-05 ENCOUNTER — Encounter: Payer: Self-pay | Admitting: Family Medicine

## 2023-09-05 VITALS — BP 114/70 | HR 71 | Temp 98.2°F | Ht 65.0 in | Wt 195.1 lb

## 2023-09-05 DIAGNOSIS — J329 Chronic sinusitis, unspecified: Secondary | ICD-10-CM

## 2023-09-05 DIAGNOSIS — B9689 Other specified bacterial agents as the cause of diseases classified elsewhere: Secondary | ICD-10-CM

## 2023-09-05 MED ORDER — DOXYCYCLINE HYCLATE 100 MG PO TABS: 100.0000 mg | ORAL_TABLET | Freq: Two times a day (BID) | ORAL | 0 refills | Status: DC

## 2023-09-05 NOTE — Patient Instructions (Signed)
Follow up as needed or as scheduled START the Doxycycline twice daily x7 days and then take the other 14 pills (7 days) with you on your trip Drink LOTS of fluids Continue the Symbicort and Mucinex Call with any questions or concerns Hang in there! Enjoy your trip!

## 2023-09-05 NOTE — Progress Notes (Signed)
   Subjective:    Patient ID: Karen Jones, female    DOB: September 05, 1950, 73 y.o.   MRN: [id number]  HPI 'sinus infxn'- + facial pain/pressure, HA.  + PND, congestion, sore throat. + cough.  R ear pain.  + subjective fever.  Nasal drainage is clear.  Sxs started after pt was up in her attic and have worsened since.  Used Symbicort and Mucinex.  'the only thing that's missing is the antibiotic'.   Review of Systems For ROS see HPI     Objective:   Physical Exam Vitals reviewed.  Constitutional:      General: She is not in acute distress.    Appearance: Normal appearance. She is well-developed. She is not ill-appearing.  HENT:     Head: Normocephalic and atraumatic.     Right Ear: Tympanic membrane normal.     Left Ear: Tympanic membrane normal.     Nose: Mucosal edema and congestion present. No rhinorrhea.     Right Sinus: Maxillary sinus tenderness and frontal sinus tenderness present.     Left Sinus: Maxillary sinus tenderness and frontal sinus tenderness present.     Mouth/Throat:     Pharynx: Uvula midline. Posterior oropharyngeal erythema present. No oropharyngeal exudate.  Eyes:     Conjunctiva/sclera: Conjunctivae normal.     Pupils: Pupils are equal, round, and reactive to light.  Cardiovascular:     Rate and Rhythm: Normal rate and regular rhythm.     Heart sounds: Normal heart sounds.  Pulmonary:     Effort: Pulmonary effort is normal. No respiratory distress.     Breath sounds: Normal breath sounds. No wheezing.  Musculoskeletal:     Cervical back: Normal range of motion and neck supple.  Lymphadenopathy:     Cervical: No cervical adenopathy.  Skin:    General: Skin is warm and dry.  Neurological:     General: No focal deficit present.     Mental Status: She is alert and oriented to person, place, and time.     Cranial Nerves: No cranial nerve deficit.     Motor: No weakness.     Coordination: Coordination normal.  Psychiatric:        Mood and Affect: Mood  normal.        Behavior: Behavior normal.        Thought Content: Thought content normal.           Assessment & Plan:  Bacterial sinusitis- new.  Pt's sxs and PE consistent w/ infxn.  Start Doxycycline due to allergies.  Reviewed supportive care and red flags that should prompt return.  Pt expressed understanding and is in agreement w/ plan.

## 2023-09-06 ENCOUNTER — Other Ambulatory Visit: Payer: Self-pay

## 2023-09-06 ENCOUNTER — Other Ambulatory Visit: Payer: Self-pay | Admitting: Family Medicine

## 2023-09-06 DIAGNOSIS — R42 Dizziness and giddiness: Secondary | ICD-10-CM

## 2023-09-06 DIAGNOSIS — E039 Hypothyroidism, unspecified: Secondary | ICD-10-CM

## 2023-09-06 MED ORDER — MECLIZINE HCL 25 MG PO TABS: 25.0000 mg | ORAL_TABLET | Freq: Three times a day (TID) | ORAL | 0 refills | Status: AC | PRN

## 2023-10-10 ENCOUNTER — Ambulatory Visit (INDEPENDENT_AMBULATORY_CARE_PROVIDER_SITE_OTHER): Payer: Medicare Other | Admitting: *Deleted

## 2023-10-10 DIAGNOSIS — Z780 Asymptomatic menopausal state: Secondary | ICD-10-CM

## 2023-10-10 DIAGNOSIS — Z1231 Encounter for screening mammogram for malignant neoplasm of breast: Secondary | ICD-10-CM | POA: Diagnosis not present

## 2023-10-10 DIAGNOSIS — Z0000 Encounter for general adult medical examination without abnormal findings: Secondary | ICD-10-CM | POA: Diagnosis not present

## 2023-10-10 NOTE — Progress Notes (Signed)
 Subjective:   Karen Jones is a 73 y.o. female who presents for Medicare Annual (Subsequent) preventive examination.  Visit Complete: Virtual I connected with  Maris Berger on 10/10/23 by a audio enabled telemedicine application and verified that I am speaking with the correct person using two identifiers.  Patient Location: Home  Provider Location: Home Office  I discussed the limitations of evaluation and management by telemedicine. The patient expressed understanding and agreed to proceed.  Vital Signs: Because this visit was a virtual/telehealth visit, some criteria may be missing or patient reported. Any vitals not documented were not able to be obtained and vitals that have been documented are patient reported.  Cardiac Risk Factors include: advanced age (>42men, >55 women)     Objective:    There were no vitals filed for this visit. There is no height or weight on file to calculate BMI.     10/10/2023    3:44 PM 09/13/2022    1:15 PM 08/19/2021    1:37 PM 08/17/2021    6:23 AM 06/08/2020   10:27 AM 06/03/2019    2:01 PM 05/23/2018    2:01 PM  Advanced Directives  Does Patient Have a Medical Advance Directive? Yes Yes Yes No Yes Yes Yes  Type of Advance Directive Living will Healthcare Power of [place];Living will Healthcare Power of [place];Living will  Living will Healthcare Power of [place];Living will Living will  Does patient want to make changes to medical advance directive?  No - Patient declined       Copy of Healthcare Power of Attorney in Chart?  Yes - validated most recent copy scanned in chart (See row information) No - copy requested   Yes - validated most recent copy scanned in chart (See row information)   Would patient like information on creating a medical advance directive?    No - Patient declined       Current Medications (verified) Outpatient Encounter Medications as of 10/10/2023  Medication Sig   budesonide-formoterol (SYMBICORT)  80-4.5 MCG/ACT inhaler Inhale 2 puffs into the lungs in the morning and at bedtime.   cholecalciferol (VITAMIN D3) 25 MCG (1000 UNIT) tablet Take 1,000 Units by mouth daily.   doxycycline (ADOXA) 50 MG tablet Take 50 mg by mouth 2 (two) times daily.   doxycycline (VIBRA-TABS) 100 MG tablet Take 1 tablet (100 mg total) by mouth 2 (two) times daily.   furosemide (LASIX) 20 MG tablet Take 1 tablet (20 mg total) by mouth daily as needed.   levothyroxine (SYNTHROID) 75 MCG tablet TAKE 1 TABLET BY MOUTH EVERY DAY WITH BREAKFAST   meclizine (ANTIVERT) 25 MG tablet Take 1 tablet (25 mg total) by mouth 3 (three) times daily as needed for dizziness.   ondansetron (ZOFRAN-ODT) 4 MG disintegrating tablet Take by mouth.   tretinoin (RETIN-A) 0.05 % cream Apply 1 application topically at bedtime.   No facility-administered encounter medications on file as of 10/10/2023.    Allergies (verified) Penicillins and Levofloxacin   History: Past Medical History:  Diagnosis Date   Allergy    Anemia    Benign positional vertigo    Cancer (HCC)    skin   Colon polyps    Hypothyroidism    Lung nodule 08/29/2021   OAB (overactive bladder)    Pain in limb    Pneumonia    Thyroid disease    Vaccine counseling 08/29/2021   Past Surgical History:  Procedure Laterality Date   ABDOMINAL HYSTERECTOMY  BILATERAL OOPHORECTOMY  1995   BRONCHIAL WASHINGS  08/17/2021   Procedure: BRONCHIAL WASHINGS;  Surgeon: Omar Person, MD;  Location: [facility] ENDOSCOPY;  Service: Pulmonary;;   COLONOSCOPY  1995,1997,2001,2005,2010   polyps removed every colonoscopy except 1 time,per pt   HAND SURGERY     to remove webbing between fingers    HEMOSTASIS CONTROL  08/17/2021   Procedure: HEMOSTASIS CONTROL;  Surgeon: Omar Person, MD;  Location: [facility] ENDOSCOPY;  Service: Pulmonary;;   POLYPECTOMY     TONSILLECTOMY     TUBAL LIGATION     UMBILICAL HERNIA REPAIR     VIDEO BRONCHOSCOPY N/A 08/17/2021   Procedure: VIDEO  BRONCHOSCOPY WITHOUT FLUORO;  Surgeon: Omar Person, MD;  Location: [facility] ENDOSCOPY;  Service: Pulmonary;  Laterality: N/A;   Family History  Problem Relation Age of Onset   Emphysema Mother    Colon cancer Father 54   Cancer Father    Heart disease Father    Heart attack Father    Colon polyps Brother    Breast cancer Neg Hx    Esophageal cancer Neg Hx    Rectal cancer Neg Hx    Stomach cancer Neg Hx    Social History   Socioeconomic History   Marital status: Married    Spouse name: Not on file   Number of children: Not on file   Years of education: Not on file   Highest education level: Not on file  Occupational History   Occupation: Retired  Tobacco Use   Smoking status: Never   Smokeless tobacco: Never  Vaping Use   Vaping status: Never Used  Substance and Sexual Activity   Alcohol use: Yes    Alcohol/week: 5.0 standard drinks of alcohol    Types: 3 Glasses of wine, 2 Shots of liquor per week   Drug use: No   Sexual activity: Not Currently  Other Topics Concern   Not on file  Social History Narrative   Not on file   Social Drivers of Health   Financial Resource Strain: Low Risk  (10/10/2023)   Overall Financial Resource Strain (CARDIA)    Difficulty of Paying Living Expenses: Not hard at all  Food Insecurity: No Food Insecurity (10/10/2023)   Hunger Vital Sign    Worried About Running Out of Food in the Last Year: Never true    Ran Out of Food in the Last Year: Never true  Transportation Needs: No Transportation Needs (10/10/2023)   PRAPARE - Administrator, Civil Service (Medical): No    Lack of Transportation (Non-Medical): No  Physical Activity: Insufficiently Active (10/10/2023)   Exercise Vital Sign    Days of Exercise per Week: 7 days    Minutes of Exercise per Session: 20 min  Stress: No Stress Concern Present (10/10/2023)   [employer] of Occupational Health - Occupational Stress Questionnaire    Feeling of Stress : Not at all   Social Connections: Socially Integrated (10/10/2023)   Social Connection and Isolation Panel [NHANES]    Frequency of Communication with Friends and Family: More than three times a week    Frequency of Social Gatherings with Friends and Family: More than three times a week    Attends Religious Services: More than 4 times per year    Active Member of [employer] or Organizations: Yes    Attends Engineer, structural: More than 4 times per year    Marital Status: Married    Tobacco Counseling Counseling given:  Not Answered   Clinical Intake:  Pre-visit preparation completed: Yes  Pain : No/denies pain     Diabetes: No  How often do you need to have someone help you when you read instructions, pamphlets, or other written materials from your doctor or pharmacy?: 1 - Never  Interpreter Needed?: No  Information entered by :: Remi Haggard LPN   Activities of Daily Living    10/10/2023    3:51 PM  In your present state of health, do you have any difficulty performing the following activities:  Hearing? 1  Vision? 0  Difficulty concentrating or making decisions? 0  Walking or climbing stairs? 0  Dressing or bathing? 0  Doing errands, shopping? 0  Preparing Food and eating ? N  Using the Toilet? N  In the past six months, have you accidently leaked urine? Y  Do you have problems with loss of bowel control? N  Managing your Medications? N  Managing your Finances? N  Housekeeping or managing your Housekeeping? N    Patient Care Team: Sheliah Hatch, MD as PCP - General (Family Medicine) Myrtie Neither Andreas Blower, MD as Consulting Physician (Gastroenterology) Consuela Mimes, MD as Consulting Physician (Family Medicine) Isaac Bliss, MD as Referring Physician (Dermatology) [facility], [facility] (Specialist) Dahlia Byes, [facility] as Pharmacist (Pharmacist)  Indicate any recent Medical Services you may have received from other than [facility] providers in the past year (date may be  approximate).     Assessment:   This is a routine wellness examination for Karen Jones.  Hearing/Vision screen Hearing Screening - Comments:: Bilateral hearing aids Vision Screening - Comments:: Up to date [facility]   Goals Addressed             This Visit's Progress    Weight (lb) < 200 lb (90.7 kg)         Depression Screen    10/10/2023    3:50 PM 09/05/2023   10:22 AM 09/13/2022    1:11 PM 03/11/2022   10:38 AM 11/22/2021    9:07 AM 08/19/2021    1:43 PM 08/19/2021    1:35 PM  PHQ 2/9 Scores  PHQ - 2 Score 0 0 0 0 0 0 0  PHQ- 9 Score 1 1  1  0      Fall Risk    10/10/2023    3:43 PM 09/05/2023   10:21 AM 09/13/2022    1:14 PM 03/11/2022   10:38 AM 11/22/2021    9:07 AM  Fall Risk   Falls in the past year? 0 0 0 0 0  Number falls in past yr: 0 0 0 0 0  Injury with Fall? 0 0 0 0 0  Risk for fall due to :  No Fall Risks No Fall Risks No Fall Risks No Fall Risks  Follow up Falls evaluation completed;Education provided;Falls prevention discussed  Falls prevention discussed Falls evaluation completed Falls evaluation completed    MEDICARE RISK AT HOME: Medicare Risk at Home Any stairs in or around the home?: No If so, are there any without handrails?: No Home free of loose throw rugs in walkways, pet beds, electrical cords, etc?: Yes Adequate lighting in your home to reduce risk of falls?: Yes Life alert?: No Use of a cane, walker or w/c?: No Grab bars in the bathroom?: No Shower chair or bench in shower?: No Elevated toilet seat or a handicapped toilet?: No  TIMED UP AND GO:  Was the test performed?  No  Cognitive Function:    06/03/2019    2:19 PM 05/23/2018    2:02 PM  MMSE - Mini Mental State Exam  Orientation to time 5 5  Orientation to Place 5 5  Registration 3 3  Attention/ Calculation 5 5  Recall 2 2  Language- name 2 objects 2 2  Language- repeat 1 1  Language- follow 3 step command 3 3  Language- read & follow direction 1 1  Write a  sentence 1 1  Copy design 1 1  Total score 29 29        10/10/2023    3:45 PM 09/13/2022    1:15 PM  6CIT Screen  What Year? 0 points 0 points  What month? 0 points 0 points  What time? 0 points 0 points  Count back from 20 0 points 0 points  Months in reverse 0 points 0 points  Repeat phrase 0 points 0 points  Total Score 0 points 0 points    Immunizations Immunization History  Administered Date(s) Administered   Fluad Quad(high Dose 65+) 06/07/2022   Hepatitis A 03/09/2011, 09/21/2011   Hepatitis A, Ped/Adol-2 Dose 01/05/2011, 03/09/2011, 09/21/2011   Hepatitis B 01/05/2011, 03/09/2011, 03/20/2012   Hepatitis B, PED/ADOLESCENT 09/21/2011   Influenza Split 06/07/2022   Influenza, High Dose Seasonal PF 05/23/2018, 04/18/2019, 05/15/2023   Influenza,inj,Quad PF,6+ Mos 04/21/2016, 04/26/2017   Influenza-Unspecified 05/06/2010, 05/10/2011, 05/08/2012, 04/09/2013, 04/10/2014, 04/14/2015, 04/09/2020, 06/03/2021   PFIZER Comirnaty(Gray Top)Covid-19 Tri-Sucrose Vaccine 04/25/2022   PFIZER(Purple Top)SARS-COV-2 Vaccination 08/29/2019, 09/23/2019, 05/04/2020, 04/25/2022   PNEUMOCOCCAL CONJUGATE-20 08/30/2021   Pfizer Covid-19 Vaccine Bivalent Booster 56yrs & up 03/02/2021   Pfizer(Comirnaty)Fall Seasonal Vaccine 12 years and older 04/20/2023   Pneumococcal Conjugate-13 03/09/2015   Pneumococcal Polysaccharide-23 05/26/2016   Respiratory Syncytial Virus Vaccine,Recomb Aduvanted(Arexvy) 05/26/2022   Tdap 11/22/2004, 03/27/2015   Zoster Recombinant(Shingrix) 02/03/2018, 05/07/2018   Zoster, Live 06/24/2010    TDAP status: Up to date  Flu Vaccine status: Up to date  Pneumococcal vaccine status: Up to date  Covid-19 vaccine status: Information provided on how to obtain vaccines.   Qualifies for Shingles Vaccine? Yes   Zostavax completed Yes   Shingrix Completed?: Yes  Screening Tests Health Maintenance  Topic Date Due   COVID-19 Vaccine (7 - 2024-25 season) 10/18/2023    MAMMOGRAM  02/16/2024   Medicare Annual Wellness (AWV)  10/09/2024   Colonoscopy  10/12/2024   DTaP/Tdap/Td (3 - Td or Tdap) 03/26/2025   Pneumonia Vaccine 15+ Years old  Completed   INFLUENZA VACCINE  Completed   DEXA SCAN  Completed   Hepatitis C Screening  Completed   Zoster Vaccines- Shingrix  Completed   HPV VACCINES  Aged Out    Health Maintenance  There are no preventive care reminders to display for this patient.   Colorectal cancer screening: Type of screening: Colonoscopy. Completed 2023. Repeat every 3 years  Mammogram status: Ordered  . Pt provided with contact info and advised to call to schedule appt.   Bone Density status: Ordered  . Pt provided with contact info and advised to call to schedule appt.  Lung Cancer Screening: (Low Dose CT Chest recommended if Age 42-80 years, 20 pack-year currently smoking OR have quit w/in 15years.) does not qualify.   Lung Cancer Screening Referral:   Additional Screening:  Hepatitis C Screening: does not qualify; Completed 2019  Vision Screening: Recommended annual ophthalmology exams for early detection of glaucoma and other disorders of the eye. Is the patient up to date with  their annual eye exam?  Yes  Who is the provider or what is the name of the office in which the patient attends annual eye exams? [facility]  If pt is not established with a provider, would they like to be referred to a provider to establish care? No .   Dental Screening: Recommended annual dental exams for proper oral hygiene    Community Resource Referral / Chronic Care Management: CRR required this visit?  No   CCM required this visit?  No     Plan:     I have personally reviewed and noted the following in the patient's chart:   Medical and social history Use of alcohol, tobacco or illicit drugs  Current medications and supplements including opioid prescriptions. Patient is not currently taking opioid  prescriptions. Functional ability and status Nutritional status Physical activity Advanced directives List of other physicians Hospitalizations, surgeries, and ER visits in previous 12 months Vitals Screenings to include cognitive, depression, and falls Referrals and appointments  In addition, I have reviewed and discussed with patient certain preventive protocols, quality metrics, and best practice recommendations. A written personalized care plan for preventive services as well as general preventive health recommendations were provided to patient.     Remi Haggard, LPN   [id number]   After Visit Summary: (MyChart) Due to this being a telephonic visit, the after visit summary with patients personalized plan was offered to patient via MyChart   Nurse Notes:

## 2023-10-10 NOTE — Patient Instructions (Signed)
 Karen Jones , Thank you for taking time to come for your Medicare Wellness Visit. I appreciate your ongoing commitment to your health goals. Please review the following plan we discussed and let me know if I can assist you in the future.   Screening recommendations/referrals: Colonoscopy: up to date Mammogram: Education provided Bone Density:  ordered Recommended yearly ophthalmology/optometry visit for glaucoma screening and checkup Recommended yearly dental visit for hygiene and checkup  Vaccinations: Influenza vaccine: up to date Pneumococcal vaccine: up to date Tdap vaccine: up to date Shingles vaccine: up to date      Preventive Care 65 Years and Older, Female Preventive care refers to lifestyle choices and visits with your health care provider that can promote health and wellness. What does preventive care include? A yearly physical exam. This is also called an annual well check. Dental exams once or twice a year. Routine eye exams. Ask your health care provider how often you should have your eyes checked. Personal lifestyle choices, including: Daily care of your teeth and gums. Regular physical activity. Eating a healthy diet. Avoiding tobacco and drug use. Limiting alcohol use. Practicing safe sex. Taking low-dose aspirin every day. Taking vitamin and mineral supplements as recommended by your health care provider. What happens during an annual well check? The services and screenings done by your health care provider during your annual well check will depend on your age, overall health, lifestyle risk factors, and family history of disease. Counseling  Your health care provider may ask you questions about your: Alcohol use. Tobacco use. Drug use. Emotional well-being. Home and relationship well-being. Sexual activity. Eating habits. History of falls. Memory and ability to understand (cognition). Work and work Astronomer. Reproductive health. Screening  You may  have the following tests or measurements: Height, weight, and BMI. Blood pressure. Lipid and cholesterol levels. These may be checked every 5 years, or more frequently if you are over 89 years old. Skin check. Lung cancer screening. You may have this screening every year starting at age 37 if you have a 30-pack-year history of smoking and currently smoke or have quit within the past 15 years. Fecal occult blood test (FOBT) of the stool. You may have this test every year starting at age 52. Flexible sigmoidoscopy or colonoscopy. You may have a sigmoidoscopy every 5 years or a colonoscopy every 10 years starting at age 78. Hepatitis C blood test. Hepatitis B blood test. Sexually transmitted disease (STD) testing. Diabetes screening. This is done by checking your blood sugar (glucose) after you have not eaten for a while (fasting). You may have this done every 1-3 years. Bone density scan. This is done to screen for osteoporosis. You may have this done starting at age 56. Mammogram. This may be done every 1-2 years. Talk to your health care provider about how often you should have regular mammograms. Talk with your health care provider about your test results, treatment options, and if necessary, the need for more tests. Vaccines  Your health care provider may recommend certain vaccines, such as: Influenza vaccine. This is recommended every year. Tetanus, diphtheria, and acellular pertussis (Tdap, Td) vaccine. You may need a Td booster every 10 years. Zoster vaccine. You may need this after age 25. Pneumococcal 13-valent conjugate (PCV13) vaccine. One dose is recommended after age 53. Pneumococcal polysaccharide (PPSV23) vaccine. One dose is recommended after age 52. Talk to your health care provider about which screenings and vaccines you need and how often you need them. This information is  not intended to replace advice given to you by your health care provider. Make sure you discuss any  questions you have with your health care provider. Document Released: 08/07/2015 Document Revised: 03/30/2016 Document Reviewed: 05/12/2015 Elsevier Interactive Patient Education  2017 [employer].  Fall Prevention in the Home Falls can cause injuries. They can happen to people of all ages. There are many things you can do to make your home safe and to help prevent falls. What can I do on the outside of my home? Regularly fix the edges of walkways and driveways and fix any cracks. Remove anything that might make you trip as you walk through a door, such as a raised step or threshold. Trim any bushes or trees on the path to your home. Use bright outdoor lighting. Clear any walking paths of anything that might make someone trip, such as rocks or tools. Regularly check to see if handrails are loose or broken. Make sure that both sides of any steps have handrails. Any raised decks and porches should have guardrails on the edges. Have any leaves, snow, or ice cleared regularly. Use sand or salt on walking paths during winter. Clean up any spills in your garage right away. This includes oil or grease spills. What can I do in the bathroom? Use night lights. Install grab bars by the toilet and in the tub and shower. Do not use towel bars as grab bars. Use non-skid mats or decals in the tub or shower. If you need to sit down in the shower, use a plastic, non-slip stool. Keep the floor dry. Clean up any water that spills on the floor as soon as it happens. Remove soap buildup in the tub or shower regularly. Attach bath mats securely with double-sided non-slip rug tape. Do not have throw rugs and other things on the floor that can make you trip. What can I do in the bedroom? Use night lights. Make sure that you have a light by your bed that is easy to reach. Do not use any sheets or blankets that are too big for your bed. They should not hang down onto the floor. Have a firm chair that has side  arms. You can use this for support while you get dressed. Do not have throw rugs and other things on the floor that can make you trip. What can I do in the kitchen? Clean up any spills right away. Avoid walking on wet floors. Keep items that you use a lot in easy-to-reach places. If you need to reach something above you, use a strong step stool that has a grab bar. Keep electrical cords out of the way. Do not use floor polish or wax that makes floors slippery. If you must use wax, use non-skid floor wax. Do not have throw rugs and other things on the floor that can make you trip. What can I do with my stairs? Do not leave any items on the stairs. Make sure that there are handrails on both sides of the stairs and use them. Fix handrails that are broken or loose. Make sure that handrails are as long as the stairways. Check any carpeting to make sure that it is firmly attached to the stairs. Fix any carpet that is loose or worn. Avoid having throw rugs at the top or bottom of the stairs. If you do have throw rugs, attach them to the floor with carpet tape. Make sure that you have a light switch at the top of the  stairs and the bottom of the stairs. If you do not have them, ask someone to add them for you. What else can I do to help prevent falls? Wear shoes that: Do not have high heels. Have rubber bottoms. Are comfortable and fit you well. Are closed at the toe. Do not wear sandals. If you use a stepladder: Make sure that it is fully opened. Do not climb a closed stepladder. Make sure that both sides of the stepladder are locked into place. Ask someone to hold it for you, if possible. Clearly mark and make sure that you can see: Any grab bars or handrails. First and last steps. Where the edge of each step is. Use tools that help you move around (mobility aids) if they are needed. These include: Canes. Walkers. Scooters. Crutches. Turn on the lights when you go into a dark area.  Replace any light bulbs as soon as they burn out. Set up your furniture so you have a clear path. Avoid moving your furniture around. If any of your floors are uneven, fix them. If there are any pets around you, be aware of where they are. Review your medicines with your doctor. Some medicines can make you feel dizzy. This can increase your chance of falling. Ask your doctor what other things that you can do to help prevent falls. This information is not intended to replace advice given to you by your health care provider. Make sure you discuss any questions you have with your health care provider. Document Released: 05/07/2009 Document Revised: 12/17/2015 Document Reviewed: 08/15/2014 Elsevier Interactive Patient Education  2017 [employer].

## 2023-12-06 ENCOUNTER — Other Ambulatory Visit: Payer: Self-pay | Admitting: Family Medicine

## 2023-12-06 DIAGNOSIS — E039 Hypothyroidism, unspecified: Secondary | ICD-10-CM

## 2023-12-07 ENCOUNTER — Encounter: Payer: Self-pay | Admitting: Family Medicine

## 2023-12-07 ENCOUNTER — Ambulatory Visit (INDEPENDENT_AMBULATORY_CARE_PROVIDER_SITE_OTHER): Admitting: Family Medicine

## 2023-12-07 VITALS — BP 122/62 | HR 89 | Temp 97.9°F | Ht 65.0 in | Wt 196.0 lb

## 2023-12-07 DIAGNOSIS — E669 Obesity, unspecified: Secondary | ICD-10-CM

## 2023-12-07 DIAGNOSIS — L959 Vasculitis limited to the skin, unspecified: Secondary | ICD-10-CM

## 2023-12-07 DIAGNOSIS — E039 Hypothyroidism, unspecified: Secondary | ICD-10-CM

## 2023-12-07 DIAGNOSIS — E559 Vitamin D deficiency, unspecified: Secondary | ICD-10-CM | POA: Diagnosis not present

## 2023-12-07 LAB — CBC WITH DIFFERENTIAL/PLATELET
Basophils Absolute: 0 10*3/uL (ref 0.0–0.1)
Basophils Relative: 1 % (ref 0.0–3.0)
Eosinophils Absolute: 0.1 10*3/uL (ref 0.0–0.7)
Eosinophils Relative: 1.3 % (ref 0.0–5.0)
HCT: 41.1 % (ref 36.0–46.0)
Hemoglobin: 13.4 g/dL (ref 12.0–15.0)
Lymphocytes Relative: 30.7 % (ref 12.0–46.0)
Lymphs Abs: 1.5 10*3/uL (ref 0.7–4.0)
MCHC: 32.7 g/dL (ref 30.0–36.0)
MCV: 91.8 fl (ref 78.0–100.0)
Monocytes Absolute: 0.5 10*3/uL (ref 0.1–1.0)
Monocytes Relative: 9.7 % (ref 3.0–12.0)
Neutro Abs: 2.7 10*3/uL (ref 1.4–7.7)
Neutrophils Relative %: 57.3 % (ref 43.0–77.0)
Platelets: 230 10*3/uL (ref 150.0–400.0)
RBC: 4.48 Mil/uL (ref 3.87–5.11)
RDW: 14.4 % (ref 11.5–15.5)
WBC: 4.7 10*3/uL (ref 4.0–10.5)

## 2023-12-07 LAB — TSH: TSH: 1.05 u[IU]/mL (ref 0.35–5.50)

## 2023-12-07 LAB — BASIC METABOLIC PANEL WITH GFR
BUN: 17 mg/dL (ref 6–23)
CO2: 29 meq/L (ref 19–32)
Calcium: 9.5 mg/dL (ref 8.4–10.5)
Chloride: 105 meq/L (ref 96–112)
Creatinine, Ser: 0.8 mg/dL (ref 0.40–1.20)
GFR: 72.37 mL/min (ref >60.00)
Glucose, Bld: 94 mg/dL (ref 70–99)
Potassium: 4.5 meq/L (ref 3.5–5.1)
Sodium: 141 meq/L (ref 135–145)

## 2023-12-07 LAB — HEPATIC FUNCTION PANEL
ALT: 10 U/L (ref 0–35)
AST: 17 U/L (ref 0–37)
Albumin: 4.2 g/dL (ref 3.5–5.2)
Alkaline Phosphatase: 44 U/L (ref 39–117)
Bilirubin, Direct: 0.1 mg/dL (ref 0.0–0.3)
Total Bilirubin: 0.6 mg/dL (ref 0.2–1.2)
Total Protein: 6.6 g/dL (ref 6.0–8.3)

## 2023-12-07 LAB — LIPID PANEL
Cholesterol: 177 mg/dL (ref 0–200)
HDL: 64.6 mg/dL (ref >39.00)
LDL Cholesterol: 94 mg/dL (ref 0–99)
NonHDL: 112.14
Total CHOL/HDL Ratio: 3
Triglycerides: 89 mg/dL (ref 0.0–149.0)
VLDL: 17.8 mg/dL (ref 0.0–40.0)

## 2023-12-07 LAB — VITAMIN D 25 HYDROXY (VIT D DEFICIENCY, FRACTURES): VITD: 36.86 ng/mL (ref 30.00–100.00)

## 2023-12-07 NOTE — Assessment & Plan Note (Signed)
Chronic problem.  Currently asymptomatic on Levothyroxine 75mcg daily.  Check labs.  Adjust meds prn  

## 2023-12-07 NOTE — Assessment & Plan Note (Signed)
Pt has hx of similar.  Check labs and replete prn. °

## 2023-12-07 NOTE — Patient Instructions (Addendum)
Follow up in 1 year or as needed We'll notify you of your lab results and make any changes if needed Keep up the good work on healthy diet and regular exercise- you look great!!! Call with any questions or concerns Stay Safe!  Stay Healthy! Have a great summer!!! 

## 2023-12-07 NOTE — Assessment & Plan Note (Signed)
 Ongoing issue.  Weight and BMI are stable.  Stressed need for healthy diet and regular exercise.  Check labs to risk stratify.

## 2023-12-07 NOTE — Assessment & Plan Note (Signed)
 New.  Pt has hx of exercise induced vasculitis of lower legs.  She has pictures from her recent trip to [place] and legs appear similar.  She is already wearing compression hose, using ice, and is elevating her legs.  Applauded her efforts and encouraged her to continue- particularly when traveling.  No further work up needed.  Discussed that previous vein procedures have likely limited her venous return and contributed to the problem.  Pt expressed understanding and is in agreement w/ plan.

## 2023-12-07 NOTE — Progress Notes (Signed)
   Subjective:    Patient ID: Karen Jones, female    DOB: [birth year], 73 y.o.   MRN: [id number]  HPI Hypothyroid- chronic problem.  On Levothyroxine  75mcg daily.  No change in energy level.  No changes to skin/hair/nails.  Obesity- ongoing issue.  Weight and BMI are stable at 196 lbs and 32.62 respectively.  No CP, SOB, abd pain, N/V.  LE edema- pt reports she has increased leg swelling and redness when she travels and does a lot of walking.  Will wear compression hose, ice, and elevate her legs.  Has had multiple vein procedures done previously.  Wants to know what are the next steps.  Vit D deficiency- due for repeat labs   Review of Systems For ROS see HPI     Objective:   Physical Exam Vitals reviewed.  Constitutional:      General: She is not in acute distress.    Appearance: Normal appearance. She is well-developed. She is not ill-appearing.  HENT:     Head: Normocephalic and atraumatic.  Eyes:     Conjunctiva/sclera: Conjunctivae normal.     Pupils: Pupils are equal, round, and reactive to light.  Neck:     Thyroid : No thyromegaly.  Cardiovascular:     Rate and Rhythm: Normal rate and regular rhythm.     Pulses: Normal pulses.     Heart sounds: Normal heart sounds. No murmur heard. Pulmonary:     Effort: Pulmonary effort is normal. No respiratory distress.     Breath sounds: Normal breath sounds.  Abdominal:     General: There is no distension.     Palpations: Abdomen is soft.     Tenderness: There is no abdominal tenderness.  Musculoskeletal:     Cervical back: Normal range of motion and neck supple.     Right lower leg: No edema.     Left lower leg: No edema.  Lymphadenopathy:     Cervical: No cervical adenopathy.  Skin:    General: Skin is warm and dry.  Neurological:     General: No focal deficit present.     Mental Status: She is alert and oriented to person, place, and time.  Psychiatric:        Mood and Affect: Mood normal.        Behavior:  Behavior normal.        Thought Content: Thought content normal.           Assessment & Plan:

## 2023-12-08 ENCOUNTER — Ambulatory Visit: Payer: Self-pay | Admitting: Family Medicine

## 2023-12-08 NOTE — Telephone Encounter (Signed)
 Pt has reviewed via MyChart

## 2023-12-08 NOTE — Telephone Encounter (Signed)
-----   Message from Laymon Priest sent at 12/08/2023  7:31 AM EDT ----- Labs look great!  No changes at this time

## 2024-01-04 ENCOUNTER — Other Ambulatory Visit: Payer: Self-pay | Admitting: Family Medicine

## 2024-01-04 DIAGNOSIS — Z1231 Encounter for screening mammogram for malignant neoplasm of breast: Secondary | ICD-10-CM

## 2024-02-20 ENCOUNTER — Ambulatory Visit
Admission: RE | Admit: 2024-02-20 | Discharge: 2024-02-20 | Disposition: A | Discharge status: Home / Self Care | Source: Ambulatory Visit | Attending: Family Medicine | Admitting: Family Medicine

## 2024-02-20 DIAGNOSIS — Z1231 Encounter for screening mammogram for malignant neoplasm of breast: Secondary | ICD-10-CM

## 2024-03-04 ENCOUNTER — Other Ambulatory Visit: Payer: Self-pay | Admitting: Family Medicine

## 2024-03-04 DIAGNOSIS — E039 Hypothyroidism, unspecified: Secondary | ICD-10-CM

## 2024-03-15 ENCOUNTER — Ambulatory Visit: Payer: Self-pay | Admitting: *Deleted

## 2024-03-15 ENCOUNTER — Ambulatory Visit: Admitting: Family

## 2024-03-15 VITALS — BP 112/69 | HR 70 | Temp 97.7°F | Ht 65.0 in | Wt 191.4 lb

## 2024-03-15 DIAGNOSIS — T85698A Other mechanical complication of other specified internal prosthetic devices, implants and grafts, initial encounter: Secondary | ICD-10-CM | POA: Diagnosis not present

## 2024-03-15 DIAGNOSIS — H9201 Otalgia, right ear: Secondary | ICD-10-CM | POA: Diagnosis not present

## 2024-03-15 NOTE — Telephone Encounter (Signed)
 FYI Only or Action Required?: FYI only for provider.  Patient was last seen in primary care on 12/07/2023 by Mahlon Comer BRAVO, MD.  Called Nurse Triage reporting Lymphadenopathy.  Symptoms began yesterday.  Interventions attempted: Nothing.  Symptoms are: gradually worsening.  Triage Disposition: See Physician Within 24 Hours  Patient/caregiver understands and will follow disposition?: Call to [facility]- patient resistant about going to [facility]- she will schedule within [facility] offices   Reason for Disposition  [1] Single large node AND [2] size > 1 inch (2.5 cm) AND [3] no fever    With ear pain  Answer Assessment - Initial Assessment Questions 1. LOCATION: Where is the swollen node located? Is the matching node on the other side of the body also swollen?      Below the right ear 2. SIZE: How big is the node? (e.g., inches or centimeters; or compared to common objects such as pea, bean, marble, golf ball)      Grape size 3. ONSET: When did the swelling start?      yesterday 4. NECK NODES: Is there a sore throat, runny nose or other symptoms of a cold?      Ear pain, roof of mouth itchy 5. GROIN OR ARMPIT NODES: Is there a sore, scratch, cut or painful red area on that arm or leg?      no 6. FEVER: Do you have a fever? If Yes, ask: What is it, how was it measured, and when did it start?      no 7. CAUSE: What do you think is causing the swollen lymph nodes?     sinus 8. OTHER SYMPTOMS: Do you have any other symptoms? (e.g., node is tender to touch, skin redness over node, weight changes)     Exposure to COVID- negative testing  Protocols used: Lymph Nodes - Swollen-A-AH    Copied from CRM #[id number]. Topic: Clinical - Red Word Triage >> Mar 15, 2024  9:12 AM Gennette ORN wrote: Red Word that prompted transfer to Nurse Triage: Patient's roof of the mouth is very itchy and she being having ear ache since yesterday, and the glands under her ear is swollen.

## 2024-03-15 NOTE — Progress Notes (Signed)
 Patient ID: Karen Jones, female    DOB: Nov 24, 1950, 73 y.o.   MRN: [id number]  Chief Complaint  Patient presents with   Ear Pain    Pt c/o right ear pain and lymph node swelling in neck. Pt's husband tested positive for Covid on Monday, pt tested yesterday at home which was negative. Has tried ear drops and tylenol  which did help sx.   Discussed the use of AI scribe software for clinical note transcription with the patient, who gave verbal consent to proceed.  History of Present Illness   Karen Jones is a 73 year old female who presents with ear pain and congestion following exposure to COVID-19.  Otolaryngologic symptoms - Ear pain affecting the ear with a previously placed tube - Itching on the roof of the mouth - Swollen glands - Congestion without rhinorrhea - Nasal ridge pain - No cough - Sore throat and headache at symptom onset  Covid-19 exposure and testing - Recent exposure to COVID-19 through her husband, who tested positive - COVID-19 test negative  Symptom management and medication use - Ear drops provided relief of ear pain - Tylenol  reduced pain - Takes Zyrtec daily for allergies - Has Flonase but avoids use due to taste - Has used Symbicort  and Flonase in the past for lung issues     Assessment & Plan:     Eustachian tube dysfunction with tympanostomy tube and otalgia Blue tympanostomy tube is noted to be 1/2 away out of TM with yellowish dry drainage. Tube appears to be dislodging, complicating assessment. - Contact ENT for evaluation of tympanostomy tube and drainage. - Advised to monitor for blue plastic piece indicating tube has come out. - Continue Tylenol  for pain management. - Encourage hydration.  Acute upper respiratory symptoms (congestion, sore throat, ear pain) Symptoms suggest viral or allergy-related etiology. COVID-19 test negative despite exposure. Persistent nasal congestion noted. - Continue Zyrtec for allergy management. -  Consider using generic Nasacort or Flonase for nasal congestion, drainage, throat drainage, 1 squirt each nostril daily or twice a day for 2 days, then daily until symptoms are resolved. - Advise against using Sudafed due to limited efficacy and potential side effects. - Monitor symptoms and report back if not improved by next week.      Subjective:    Outpatient Medications Prior to Visit  Medication Sig Dispense Refill   budesonide -formoterol  (SYMBICORT ) 80-4.5 MCG/ACT inhaler Inhale 2 puffs into the lungs in the morning and at bedtime. 1 each 12   cholecalciferol (VITAMIN D3) 25 MCG (1000 UNIT) tablet Take 1,000 Units by mouth daily.     doxycycline  (ADOXA) 50 MG tablet Take 50 mg by mouth 2 (two) times daily.     doxycycline  (VIBRA -TABS) 100 MG tablet Take 1 tablet (100 mg total) by mouth 2 (two) times daily. 28 tablet 0   furosemide  (LASIX ) 20 MG tablet Take 1 tablet (20 mg total) by mouth daily as needed. 30 tablet 3   levothyroxine  (SYNTHROID ) 75 MCG tablet TAKE 1 TABLET BY MOUTH EVERY DAY WITH BREAKFAST 90 tablet 0   meclizine  (ANTIVERT ) 25 MG tablet Take 1 tablet (25 mg total) by mouth 3 (three) times daily as needed for dizziness. 30 tablet 0   ondansetron  (ZOFRAN -ODT) 4 MG disintegrating tablet Take by mouth.     tretinoin (RETIN-A) 0.05 % cream Apply 1 application topically at bedtime.     No facility-administered medications prior to visit.   Past Medical History:  Diagnosis Date   Allergy  Anemia    Benign positional vertigo    Cancer (HCC)    skin   Colon polyps    Hypothyroidism    Lung nodule 08/29/2021   OAB (overactive bladder)    Pain in limb    Pneumonia    Thyroid  disease    Vaccine counseling 08/29/2021   Past Surgical History:  Procedure Laterality Date   ABDOMINAL HYSTERECTOMY     BILATERAL OOPHORECTOMY  1995   BRONCHIAL WASHINGS  08/17/2021   Procedure: BRONCHIAL WASHINGS;  Surgeon: Gladis Leonor HERO, MD;  Location: [facility] ENDOSCOPY;  Service: Pulmonary;;    COLONOSCOPY  1995,1997,2001,2005,2010   polyps removed every colonoscopy except 1 time,per pt   HAND SURGERY     to remove webbing between fingers    HEMOSTASIS CONTROL  08/17/2021   Procedure: HEMOSTASIS CONTROL;  Surgeon: Gladis Leonor HERO, MD;  Location: [facility] ENDOSCOPY;  Service: Pulmonary;;   POLYPECTOMY     TONSILLECTOMY     TUBAL LIGATION     UMBILICAL HERNIA REPAIR     VIDEO BRONCHOSCOPY N/A 08/17/2021   Procedure: VIDEO BRONCHOSCOPY WITHOUT FLUORO;  Surgeon: Gladis Leonor HERO, MD;  Location: [facility] ENDOSCOPY;  Service: Pulmonary;  Laterality: N/A;   Allergies  Allergen Reactions   Penicillins Shortness Of Breath and Rash    rash, difficulty breathing   Levofloxacin     Myalgias (intolerance)      Objective:    Physical Exam Vitals and nursing note reviewed.  Constitutional:      Appearance: Normal appearance.  HENT:     Right Ear: A PE tube (blue tube 1/2-3/4 extended out from TM with yellow crusty drg noted surrounding, no bulging or erythema noted) is present.     Nose: Nasal tenderness (along bridge, no erythema or lesion) and congestion present. No rhinorrhea.     Right Sinus: No frontal sinus tenderness.     Left Sinus: No frontal sinus tenderness.     Mouth/Throat:     Mouth: Mucous membranes are moist.     Palate: No mass and lesions (mild yellowing in posterior upper palate, no lesions or erythema).     Tonsils: No tonsillar exudate or tonsillar abscesses.  Cardiovascular:     Rate and Rhythm: Normal rate and regular rhythm.  Pulmonary:     Effort: Pulmonary effort is normal.     Breath sounds: Normal breath sounds.  Musculoskeletal:        General: Normal range of motion.  Skin:    General: Skin is warm and dry.  Neurological:     Mental Status: She is alert.  Psychiatric:        Mood and Affect: Mood normal.        Behavior: Behavior normal.    BP 112/69 (BP Location: Left Arm, Patient Position: Sitting, Cuff Size: Large)   Pulse 70   Temp 97.7 F  (36.5 C) (Temporal)   Ht 5' 5 (1.651 m)   Wt 191 lb 6.4 oz (86.8 kg)   SpO2 96%   BMI 31.85 kg/m  Wt Readings from Last 3 Encounters:  03/15/24 191 lb 6.4 oz (86.8 kg)  12/07/23 196 lb (88.9 kg)  09/05/23 195 lb 2 oz (88.5 kg)      Lucius Krabbe, NP

## 2024-06-04 ENCOUNTER — Other Ambulatory Visit: Payer: Self-pay | Admitting: Family Medicine

## 2024-06-04 DIAGNOSIS — E039 Hypothyroidism, unspecified: Secondary | ICD-10-CM

## 2024-06-11 ENCOUNTER — Other Ambulatory Visit

## 2024-07-22 ENCOUNTER — Ambulatory Visit (HOSPITAL_BASED_OUTPATIENT_CLINIC_OR_DEPARTMENT_OTHER)
Admission: RE | Admit: 2024-07-22 | Discharge: 2024-07-22 | Disposition: A | Discharge status: Home / Self Care | Source: Ambulatory Visit | Attending: Family Medicine | Admitting: Family Medicine

## 2024-07-22 DIAGNOSIS — Z780 Asymptomatic menopausal state: Secondary | ICD-10-CM | POA: Diagnosis present
